# Patient Record
Sex: Female | Born: 1955 | Race: Black or African American | Hispanic: No | Smoking: Former smoker
Health system: Southern US, Community
[De-identification: ages and names within clinical notes are randomized; demographics above are authoritative.]

## PROBLEM LIST (undated history)

## (undated) DIAGNOSIS — T7840XA Allergy, unspecified, initial encounter: Secondary | ICD-10-CM

## (undated) DIAGNOSIS — E119 Type 2 diabetes mellitus without complications: Secondary | ICD-10-CM

## (undated) DIAGNOSIS — M1990 Unspecified osteoarthritis, unspecified site: Secondary | ICD-10-CM

## (undated) DIAGNOSIS — I10 Essential (primary) hypertension: Secondary | ICD-10-CM

## (undated) DIAGNOSIS — H269 Unspecified cataract: Secondary | ICD-10-CM

## (undated) DIAGNOSIS — K219 Gastro-esophageal reflux disease without esophagitis: Secondary | ICD-10-CM

## (undated) HISTORY — DX: Allergy, unspecified, initial encounter: T78.40XA

## (undated) HISTORY — DX: Gastro-esophageal reflux disease without esophagitis: K21.9

## (undated) HISTORY — PX: TUBAL LIGATION: SHX77

## (undated) HISTORY — DX: Essential (primary) hypertension: I10

## (undated) HISTORY — DX: Unspecified osteoarthritis, unspecified site: M19.90

## (undated) HISTORY — DX: Unspecified cataract: H26.9

## (undated) HISTORY — DX: Type 2 diabetes mellitus without complications: E11.9

## (undated) HISTORY — PX: EYE SURGERY: SHX253

---

## 1998-01-02 ENCOUNTER — Encounter: Admission: RE | Admit: 1998-01-02 | Discharge: 1998-01-02 | Payer: Self-pay | Admitting: Family Medicine

## 1998-06-03 ENCOUNTER — Encounter: Admission: RE | Admit: 1998-06-03 | Discharge: 1998-06-03 | Payer: Self-pay | Admitting: Sports Medicine

## 1998-06-30 ENCOUNTER — Encounter: Admission: RE | Admit: 1998-06-30 | Discharge: 1998-06-30 | Payer: Self-pay | Admitting: Family Medicine

## 1998-07-04 ENCOUNTER — Encounter: Admission: RE | Admit: 1998-07-04 | Discharge: 1998-07-04 | Payer: Self-pay | Admitting: Family Medicine

## 1998-07-14 ENCOUNTER — Encounter: Admission: RE | Admit: 1998-07-14 | Discharge: 1998-07-14 | Payer: Self-pay | Admitting: Pediatrics

## 1998-07-29 ENCOUNTER — Encounter: Admission: RE | Admit: 1998-07-29 | Discharge: 1998-07-29 | Payer: Self-pay | Admitting: Sports Medicine

## 1998-08-08 ENCOUNTER — Encounter: Admission: RE | Admit: 1998-08-08 | Discharge: 1998-08-08 | Payer: Self-pay | Admitting: Family Medicine

## 1998-08-26 ENCOUNTER — Encounter: Admission: RE | Admit: 1998-08-26 | Discharge: 1998-08-26 | Payer: Self-pay | Admitting: Sports Medicine

## 1998-10-06 ENCOUNTER — Encounter: Admission: RE | Admit: 1998-10-06 | Discharge: 1998-10-06 | Payer: Self-pay | Admitting: Family Medicine

## 1998-11-28 ENCOUNTER — Encounter: Admission: RE | Admit: 1998-11-28 | Discharge: 1998-11-28 | Payer: Self-pay | Admitting: Family Medicine

## 1998-12-10 ENCOUNTER — Encounter: Admission: RE | Admit: 1998-12-10 | Discharge: 1998-12-10 | Payer: Self-pay | Admitting: Family Medicine

## 1998-12-22 ENCOUNTER — Encounter: Admission: RE | Admit: 1998-12-22 | Discharge: 1998-12-22 | Payer: Self-pay | Admitting: Family Medicine

## 1999-01-22 ENCOUNTER — Encounter: Admission: RE | Admit: 1999-01-22 | Discharge: 1999-01-22 | Payer: Self-pay | Admitting: Family Medicine

## 1999-02-02 ENCOUNTER — Encounter: Admission: RE | Admit: 1999-02-02 | Discharge: 1999-02-02 | Payer: Self-pay | Admitting: Family Medicine

## 1999-09-17 ENCOUNTER — Encounter: Payer: Self-pay | Admitting: Sports Medicine

## 1999-09-17 ENCOUNTER — Ambulatory Visit (HOSPITAL_COMMUNITY): Admission: RE | Admit: 1999-09-17 | Discharge: 1999-09-17 | Payer: Self-pay | Admitting: Family Medicine

## 1999-09-17 ENCOUNTER — Encounter: Admission: RE | Admit: 1999-09-17 | Discharge: 1999-09-17 | Payer: Self-pay | Admitting: Sports Medicine

## 2000-02-22 ENCOUNTER — Encounter: Admission: RE | Admit: 2000-02-22 | Discharge: 2000-02-22 | Payer: Self-pay | Admitting: Family Medicine

## 2000-03-24 ENCOUNTER — Encounter: Admission: RE | Admit: 2000-03-24 | Discharge: 2000-03-24 | Payer: Self-pay | Admitting: Family Medicine

## 2000-03-31 ENCOUNTER — Encounter: Admission: RE | Admit: 2000-03-31 | Discharge: 2000-03-31 | Payer: Self-pay | Admitting: Family Medicine

## 2000-03-31 ENCOUNTER — Encounter: Payer: Self-pay | Admitting: Family Medicine

## 2000-05-10 ENCOUNTER — Encounter: Payer: Self-pay | Admitting: Emergency Medicine

## 2000-05-10 ENCOUNTER — Emergency Department (HOSPITAL_COMMUNITY): Admission: EM | Admit: 2000-05-10 | Discharge: 2000-05-10 | Payer: Self-pay | Admitting: Emergency Medicine

## 2000-05-31 ENCOUNTER — Encounter: Admission: RE | Admit: 2000-05-31 | Discharge: 2000-05-31 | Payer: Self-pay | Admitting: Sports Medicine

## 2000-07-19 ENCOUNTER — Encounter: Admission: RE | Admit: 2000-07-19 | Discharge: 2000-07-19 | Payer: Self-pay | Admitting: Family Medicine

## 2000-07-20 ENCOUNTER — Encounter: Admission: RE | Admit: 2000-07-20 | Discharge: 2000-07-20 | Payer: Self-pay | Admitting: Family Medicine

## 2000-08-17 ENCOUNTER — Encounter (INDEPENDENT_AMBULATORY_CARE_PROVIDER_SITE_OTHER): Payer: Self-pay | Admitting: Specialist

## 2000-08-17 ENCOUNTER — Ambulatory Visit (HOSPITAL_COMMUNITY): Admission: RE | Admit: 2000-08-17 | Discharge: 2000-08-17 | Payer: Self-pay | Admitting: Gastroenterology

## 2000-11-28 ENCOUNTER — Encounter: Admission: RE | Admit: 2000-11-28 | Discharge: 2000-11-28 | Payer: Self-pay | Admitting: Family Medicine

## 2000-12-27 ENCOUNTER — Encounter: Admission: RE | Admit: 2000-12-27 | Discharge: 2000-12-27 | Payer: Self-pay | Admitting: Family Medicine

## 2001-03-10 ENCOUNTER — Encounter: Admission: RE | Admit: 2001-03-10 | Discharge: 2001-03-10 | Payer: Self-pay | Admitting: Family Medicine

## 2001-05-24 ENCOUNTER — Encounter: Admission: RE | Admit: 2001-05-24 | Discharge: 2001-05-24 | Payer: Self-pay | Admitting: Family Medicine

## 2001-11-22 ENCOUNTER — Encounter: Admission: RE | Admit: 2001-11-22 | Discharge: 2001-11-22 | Payer: Self-pay | Admitting: Family Medicine

## 2001-12-07 ENCOUNTER — Encounter: Payer: Self-pay | Admitting: Sports Medicine

## 2001-12-07 ENCOUNTER — Encounter: Admission: RE | Admit: 2001-12-07 | Discharge: 2001-12-07 | Payer: Self-pay | Admitting: Sports Medicine

## 2002-01-16 ENCOUNTER — Encounter: Admission: RE | Admit: 2002-01-16 | Discharge: 2002-01-16 | Payer: Self-pay | Admitting: Family Medicine

## 2002-02-06 ENCOUNTER — Encounter: Admission: RE | Admit: 2002-02-06 | Discharge: 2002-02-06 | Payer: Self-pay | Admitting: Family Medicine

## 2002-02-06 ENCOUNTER — Ambulatory Visit (HOSPITAL_COMMUNITY): Admission: RE | Admit: 2002-02-06 | Discharge: 2002-02-06 | Payer: Self-pay | Admitting: Family Medicine

## 2002-02-15 ENCOUNTER — Encounter: Admission: RE | Admit: 2002-02-15 | Discharge: 2002-02-15 | Payer: Self-pay | Admitting: Family Medicine

## 2002-03-06 ENCOUNTER — Encounter: Admission: RE | Admit: 2002-03-06 | Discharge: 2002-03-06 | Payer: Self-pay | Admitting: Family Medicine

## 2002-08-02 ENCOUNTER — Encounter: Admission: RE | Admit: 2002-08-02 | Discharge: 2002-08-02 | Payer: Self-pay | Admitting: Family Medicine

## 2002-08-02 ENCOUNTER — Ambulatory Visit (HOSPITAL_COMMUNITY): Admission: RE | Admit: 2002-08-02 | Discharge: 2002-08-02 | Payer: Self-pay | Admitting: Family Medicine

## 2002-08-03 ENCOUNTER — Encounter: Payer: Self-pay | Admitting: Sports Medicine

## 2002-08-03 ENCOUNTER — Encounter: Admission: RE | Admit: 2002-08-03 | Discharge: 2002-08-03 | Payer: Self-pay | Admitting: Sports Medicine

## 2002-08-21 ENCOUNTER — Encounter: Admission: RE | Admit: 2002-08-21 | Discharge: 2002-08-21 | Payer: Self-pay | Admitting: Family Medicine

## 2002-09-13 ENCOUNTER — Ambulatory Visit (HOSPITAL_COMMUNITY): Admission: RE | Admit: 2002-09-13 | Discharge: 2002-09-13 | Payer: Self-pay | Admitting: Family Medicine

## 2002-09-13 ENCOUNTER — Encounter: Payer: Self-pay | Admitting: Cardiology

## 2003-05-28 ENCOUNTER — Encounter: Admission: RE | Admit: 2003-05-28 | Discharge: 2003-05-28 | Payer: Self-pay | Admitting: Family Medicine

## 2003-06-24 ENCOUNTER — Encounter: Admission: RE | Admit: 2003-06-24 | Discharge: 2003-06-24 | Payer: Self-pay | Admitting: Family Medicine

## 2003-07-05 ENCOUNTER — Encounter (INDEPENDENT_AMBULATORY_CARE_PROVIDER_SITE_OTHER): Payer: Self-pay | Admitting: *Deleted

## 2003-07-25 ENCOUNTER — Encounter: Admission: RE | Admit: 2003-07-25 | Discharge: 2003-07-25 | Payer: Self-pay | Admitting: Family Medicine

## 2003-09-04 ENCOUNTER — Encounter: Admission: RE | Admit: 2003-09-04 | Discharge: 2003-09-04 | Payer: Self-pay | Admitting: Sports Medicine

## 2003-11-12 ENCOUNTER — Encounter: Admission: RE | Admit: 2003-11-12 | Discharge: 2003-11-12 | Payer: Self-pay | Admitting: Family Medicine

## 2003-11-29 ENCOUNTER — Encounter: Admission: RE | Admit: 2003-11-29 | Discharge: 2003-11-29 | Payer: Self-pay | Admitting: Family Medicine

## 2004-05-27 ENCOUNTER — Inpatient Hospital Stay (HOSPITAL_COMMUNITY): Admission: EM | Admit: 2004-05-27 | Discharge: 2004-06-02 | Payer: Self-pay | Admitting: Emergency Medicine

## 2004-05-27 ENCOUNTER — Ambulatory Visit: Payer: Self-pay | Admitting: Sports Medicine

## 2004-06-11 ENCOUNTER — Ambulatory Visit: Payer: Self-pay | Admitting: Family Medicine

## 2004-10-21 ENCOUNTER — Ambulatory Visit: Payer: Self-pay | Admitting: Family Medicine

## 2005-04-30 ENCOUNTER — Ambulatory Visit: Payer: Self-pay | Admitting: Family Medicine

## 2005-07-25 ENCOUNTER — Emergency Department (HOSPITAL_COMMUNITY): Admission: EM | Admit: 2005-07-25 | Discharge: 2005-07-26 | Payer: Self-pay | Admitting: Emergency Medicine

## 2005-12-24 ENCOUNTER — Ambulatory Visit: Payer: Self-pay | Admitting: Family Medicine

## 2006-01-27 ENCOUNTER — Ambulatory Visit: Payer: Self-pay | Admitting: Family Medicine

## 2006-08-09 ENCOUNTER — Ambulatory Visit (HOSPITAL_COMMUNITY): Admission: RE | Admit: 2006-08-09 | Discharge: 2006-08-09 | Payer: Self-pay | Admitting: Family Medicine

## 2006-08-09 ENCOUNTER — Ambulatory Visit: Payer: Self-pay | Admitting: Family Medicine

## 2006-09-14 ENCOUNTER — Ambulatory Visit: Payer: Self-pay | Admitting: Sports Medicine

## 2006-12-01 DIAGNOSIS — E782 Mixed hyperlipidemia: Secondary | ICD-10-CM

## 2006-12-01 DIAGNOSIS — F329 Major depressive disorder, single episode, unspecified: Secondary | ICD-10-CM

## 2006-12-01 DIAGNOSIS — E1169 Type 2 diabetes mellitus with other specified complication: Secondary | ICD-10-CM | POA: Insufficient documentation

## 2006-12-01 DIAGNOSIS — F32A Depression, unspecified: Secondary | ICD-10-CM | POA: Insufficient documentation

## 2006-12-01 DIAGNOSIS — K219 Gastro-esophageal reflux disease without esophagitis: Secondary | ICD-10-CM | POA: Insufficient documentation

## 2006-12-01 DIAGNOSIS — I10 Essential (primary) hypertension: Secondary | ICD-10-CM | POA: Insufficient documentation

## 2006-12-02 ENCOUNTER — Encounter (INDEPENDENT_AMBULATORY_CARE_PROVIDER_SITE_OTHER): Payer: Self-pay | Admitting: *Deleted

## 2007-03-31 ENCOUNTER — Encounter: Payer: Self-pay | Admitting: Family Medicine

## 2007-03-31 ENCOUNTER — Ambulatory Visit: Payer: Self-pay | Admitting: Family Medicine

## 2007-03-31 DIAGNOSIS — D179 Benign lipomatous neoplasm, unspecified: Secondary | ICD-10-CM | POA: Insufficient documentation

## 2007-06-06 ENCOUNTER — Encounter: Payer: Self-pay | Admitting: Family Medicine

## 2007-06-06 ENCOUNTER — Ambulatory Visit: Payer: Self-pay | Admitting: Family Medicine

## 2007-06-06 LAB — CONVERTED CEMR LAB
ALT: 17 units/L (ref 0–35)
AST: 16 units/L (ref 0–37)
Alkaline Phosphatase: 63 units/L (ref 39–117)
BUN: 11 mg/dL (ref 6–23)
Chloride: 103 meq/L (ref 96–112)
Glucose, Bld: 153 mg/dL — ABNORMAL HIGH (ref 70–99)
LDL Cholesterol: 148 mg/dL — ABNORMAL HIGH (ref 0–99)
Pap Smear: NORMAL
Potassium: 3.9 meq/L (ref 3.5–5.3)
Triglycerides: 152 mg/dL — ABNORMAL HIGH (ref <150)

## 2007-12-25 ENCOUNTER — Telehealth: Payer: Self-pay | Admitting: *Deleted

## 2008-02-21 ENCOUNTER — Encounter: Payer: Self-pay | Admitting: Family Medicine

## 2008-02-21 ENCOUNTER — Ambulatory Visit: Payer: Self-pay | Admitting: Family Medicine

## 2008-02-21 LAB — CONVERTED CEMR LAB
Alkaline Phosphatase: 70 units/L (ref 39–117)
CO2: 23 meq/L (ref 19–32)
Chloride: 102 meq/L (ref 96–112)
Glucose, Bld: 137 mg/dL — ABNORMAL HIGH (ref 70–99)
Potassium: 3.9 meq/L (ref 3.5–5.3)
Sodium: 141 meq/L (ref 135–145)
Total Bilirubin: 0.4 mg/dL (ref 0.3–1.2)

## 2008-03-08 ENCOUNTER — Ambulatory Visit: Payer: Self-pay | Admitting: Family Medicine

## 2008-03-08 DIAGNOSIS — E119 Type 2 diabetes mellitus without complications: Secondary | ICD-10-CM | POA: Insufficient documentation

## 2008-03-08 DIAGNOSIS — E0865 Diabetes mellitus due to underlying condition with hyperglycemia: Secondary | ICD-10-CM | POA: Insufficient documentation

## 2008-03-08 DIAGNOSIS — E0840 Diabetes mellitus due to underlying condition with diabetic neuropathy, unspecified: Secondary | ICD-10-CM

## 2008-04-18 ENCOUNTER — Ambulatory Visit: Payer: Self-pay | Admitting: Family Medicine

## 2008-06-05 ENCOUNTER — Encounter: Payer: Self-pay | Admitting: Family Medicine

## 2008-06-05 ENCOUNTER — Ambulatory Visit: Payer: Self-pay | Admitting: Family Medicine

## 2008-06-05 LAB — CONVERTED CEMR LAB
AST: 16 units/L (ref 0–37)
Albumin: 4.2 g/dL (ref 3.5–5.2)
Alkaline Phosphatase: 60 units/L (ref 39–117)
CO2: 25 meq/L (ref 19–32)
Calcium: 9.4 mg/dL (ref 8.4–10.5)
Glucose, Bld: 124 mg/dL — ABNORMAL HIGH (ref 70–99)
Total Bilirubin: 0.4 mg/dL (ref 0.3–1.2)

## 2008-08-23 ENCOUNTER — Ambulatory Visit: Payer: Self-pay | Admitting: Family Medicine

## 2008-09-09 ENCOUNTER — Ambulatory Visit: Payer: Self-pay | Admitting: Family Medicine

## 2008-09-14 ENCOUNTER — Ambulatory Visit: Payer: Self-pay | Admitting: Internal Medicine

## 2008-09-14 ENCOUNTER — Ambulatory Visit: Payer: Self-pay | Admitting: Family Medicine

## 2008-09-14 ENCOUNTER — Inpatient Hospital Stay (HOSPITAL_COMMUNITY): Admission: EM | Admit: 2008-09-14 | Discharge: 2008-09-16 | Payer: Self-pay | Admitting: Emergency Medicine

## 2008-09-16 ENCOUNTER — Encounter: Payer: Self-pay | Admitting: Family Medicine

## 2008-10-02 ENCOUNTER — Telehealth: Payer: Self-pay | Admitting: Family Medicine

## 2008-10-02 ENCOUNTER — Ambulatory Visit: Payer: Self-pay | Admitting: Family Medicine

## 2008-10-02 ENCOUNTER — Encounter: Payer: Self-pay | Admitting: Family Medicine

## 2008-10-14 ENCOUNTER — Ambulatory Visit: Payer: Self-pay | Admitting: Family Medicine

## 2008-11-19 ENCOUNTER — Encounter: Payer: Self-pay | Admitting: Family Medicine

## 2008-11-22 ENCOUNTER — Telehealth: Payer: Self-pay | Admitting: *Deleted

## 2008-12-13 ENCOUNTER — Ambulatory Visit: Payer: Self-pay | Admitting: Family Medicine

## 2008-12-13 ENCOUNTER — Telehealth: Payer: Self-pay | Admitting: Family Medicine

## 2009-01-01 ENCOUNTER — Ambulatory Visit: Payer: Self-pay | Admitting: Family Medicine

## 2009-01-01 LAB — CONVERTED CEMR LAB: Hgb A1c MFr Bld: 5.8 %

## 2009-02-14 ENCOUNTER — Encounter: Payer: Self-pay | Admitting: Family Medicine

## 2009-04-01 ENCOUNTER — Ambulatory Visit: Payer: Self-pay | Admitting: Family Medicine

## 2009-04-01 LAB — CONVERTED CEMR LAB: Hgb A1c MFr Bld: 5.7 %

## 2009-08-18 ENCOUNTER — Encounter: Payer: Self-pay | Admitting: Family Medicine

## 2009-08-18 ENCOUNTER — Ambulatory Visit: Payer: Self-pay | Admitting: Family Medicine

## 2009-08-18 ENCOUNTER — Telehealth: Payer: Self-pay | Admitting: Family Medicine

## 2009-08-19 LAB — CONVERTED CEMR LAB
Basophils Absolute: 0 10*3/uL (ref 0.0–0.1)
Eosinophils Absolute: 0.2 10*3/uL (ref 0.0–0.7)
Eosinophils Relative: 2 % (ref 0–5)
Lymphocytes Relative: 31 % (ref 12–46)
WBC: 10.4 10*3/uL (ref 4.0–10.5)

## 2009-08-21 ENCOUNTER — Ambulatory Visit: Payer: Self-pay | Admitting: Family Medicine

## 2009-08-21 LAB — CONVERTED CEMR LAB: H Pylori IgG: NEGATIVE

## 2009-10-23 ENCOUNTER — Encounter: Payer: Self-pay | Admitting: Family Medicine

## 2009-10-23 ENCOUNTER — Ambulatory Visit: Payer: Self-pay | Admitting: Family Medicine

## 2009-10-24 ENCOUNTER — Encounter: Payer: Self-pay | Admitting: Family Medicine

## 2009-10-29 ENCOUNTER — Telehealth: Payer: Self-pay | Admitting: Family Medicine

## 2009-10-29 ENCOUNTER — Encounter: Payer: Self-pay | Admitting: Family Medicine

## 2009-10-29 LAB — CONVERTED CEMR LAB
CO2: 27 meq/L (ref 19–32)
Calcium: 9.3 mg/dL (ref 8.4–10.5)
Creatinine, Ser: 0.71 mg/dL (ref 0.40–1.20)
Glucose, Bld: 116 mg/dL — ABNORMAL HIGH (ref 70–99)
HDL: 77 mg/dL (ref >39)
VLDL: 29 mg/dL (ref 0–40)

## 2009-11-06 ENCOUNTER — Telehealth (INDEPENDENT_AMBULATORY_CARE_PROVIDER_SITE_OTHER): Payer: Self-pay | Admitting: *Deleted

## 2009-11-07 ENCOUNTER — Ambulatory Visit: Payer: Self-pay | Admitting: Family Medicine

## 2010-02-05 ENCOUNTER — Ambulatory Visit: Payer: Self-pay | Admitting: Family Medicine

## 2010-02-05 ENCOUNTER — Encounter: Payer: Self-pay | Admitting: Family Medicine

## 2010-02-05 LAB — CONVERTED CEMR LAB: Direct LDL: 157 mg/dL — ABNORMAL HIGH

## 2010-07-10 ENCOUNTER — Encounter: Payer: Self-pay | Admitting: Family Medicine

## 2010-08-06 ENCOUNTER — Encounter: Payer: Self-pay | Admitting: Family Medicine

## 2010-09-03 ENCOUNTER — Ambulatory Visit: Payer: Self-pay | Admitting: Family Medicine

## 2010-09-03 LAB — CONVERTED CEMR LAB: Hgb A1c MFr Bld: 6.1 %

## 2010-11-05 NOTE — Letter (Signed)
Summary: FMLA Papers  FMLA Papers   Imported By: Clydell Hakim 10/27/2009 11:13:34  _____________________________________________________________________  External Attachment:    Type:   Image     Comment:   External Document

## 2010-11-05 NOTE — Assessment & Plan Note (Signed)
Summary: f/up,tcb   Vital Signs:  Patient profile:   55 year old female Height:      64 inches Weight:      251 pounds BMI:     43.24 Temp:     98.0 degrees F oral Pulse rate:   76 / minute BP sitting:   155 / 83  (left arm) Cuff size:   large  Vitals Entered By: Tessie Fass, CMA  Serial Vital Signs/Assessments:  Time      Position  BP       Pulse  Resp  Temp     By                     130/78                         [name]  MD  CC: F/U chronic diseases Is Patient Diabetic? Yes Pain Assessment Patient in pain? no        Primary Care Provider:  [name]  MD  CC:  F/U chronic diseases.  History of Present Illness: Mr. Abbe Amsterdam comes in today for follow-up of DM, HTN, HLD 1) DM - Sugars running higher than normal for a few weeks.  Checks them fasting only..  Running 102 - 140.  Did not eat well over the holidays.  On Metforming 1000 two times a day only.  No complaints.  Last A1C 6 months ago was 5.7.  2) HTN - running high for a week.  Running about 140s/70s.  Still taking HCTZ and enalapril.  Tolerating the medicines well. On recheck is improved. Worried about her kidneys and her potassium.  Would like them checked today.  3) HLD - Was on pravastatin 40 but made her thighs hurt so stopped it about 2 months ago. DUe to be checked.  Fasting this AM.   4) GI bleed - no further bleeding.  Originally thought was gastritis but then believed to be diverticular.  Has been off aspirin.  Informed that since it was not her stomach, she can resume aspirin.   Habits & Providers  Alcohol-Tobacco-Diet     Tobacco Status: quit  Allergies: 1)  Lovastatin (Lovastatin) 2)  Amoxicillin (Amoxicillin)  Physical Exam  General:  VS reviewed.  Well appearing, NAD. Obese Lungs:  Normal respiratory effort, chest expands symmetrically. Lungs are clear to auscultation, no crackles or wheezes. Heart:  Normal rate and regular rhythm. S1 and S2 normal without gallop, murmur, click,  rub or other extra sounds. Pulses:  2+ radial and dp pulses Extremities:  no edema   Impression & Recommendations:  Problem # 1:  DIABETES-TYPE 2 (ICD-250.00) Assessment Unchanged A1C still pending.  If > 7, will add sulfonylurea.  If <7, stay on current regimen but recheck in 3 months.  Her updated medication list for this problem includes:    Metformin Hcl 500 Mg Tabs (Metformin hcl) .Marland Kitchen... 1 tab by mouth daily for 1 week and then 1 tab by mouth two times a day.    Enalapril Maleate 20 Mg Tabs (Enalapril maleate) .Marland Kitchen... 2 tabs by mouth daily  Orders: A1C-[facility] (16109) Basic Met-[facility] ([address]-09811) [facility]- Est  Level 4 (91478)  Problem # 2:  HYPERTENSION, BENIGN SYSTEMIC (ICD-401.1) Assessment: Unchanged Continue current regimen.  SHe checks her BP periodically on her own.  Instructed that if it becomes consistently elevated to come back in sooner for change in BP regimen.  Pt agreeable.  Check Cr  and K today.  Her updated medication list for this problem includes:    Hydrochlorothiazide 25 Mg Tabs (Hydrochlorothiazide) .Marland Kitchen... Take 1 tablet by mouth once a day    Enalapril Maleate 20 Mg Tabs (Enalapril maleate) .Marland Kitchen... 2 tabs by mouth daily  Orders: Basic Met-[facility] ([address]-60454) [facility]- Est  Level 4 (09811)  Problem # 3:  HYPERCHOLESTEROLEMIA (ICD-272.0) Assessment: Unchanged Not being treated currently.  Will recheck FLP.  Pravastatin gave muscle aches.  Her updated medication list for this problem includes:    Pravastatin Sodium 40 Mg Tabs (Pravastatin sodium) .Marland Kitchen... Take 1 tablet by mouth once a day  Orders: Lipid-[facility] ([id number]) [facility]- Est  Level 4 (13086)  Problem # 4:  UNSPECIFIED HEMORRHAGE OF GASTROINTESTINAL TRACT (ICD-578.9) Believed to be diverticular.  refer to that note.  No further bleeding.  may resume aspirin.   Complete Medication List: 1)  Hydrochlorothiazide 25 Mg Tabs (Hydrochlorothiazide) .... Take 1 tablet by mouth once a day 2)  Pravastatin Sodium 40 Mg Tabs  (Pravastatin sodium) .... Take 1 tablet by mouth once a day 3)  Trazodone Hcl 50 Mg Tabs (Trazodone hcl) .Marland Kitchen.. 1-2 tabs by mouth at bedtime as needed for sleep 4)  Flonase 50 Mcg/act Susp (Fluticasone propionate) .... 2 sprays each nostril daily during seasonal allergies 5)  Metformin Hcl 500 Mg Tabs (Metformin hcl) .Marland Kitchen.. 1 tab by mouth daily for 1 week and then 1 tab by mouth two times a day. 6)  Enalapril Maleate 20 Mg Tabs (Enalapril maleate) .... 2 tabs by mouth daily 7)  Omeprazole 40 Mg Cpdr (Omeprazole) .... One tablet by mouth daily  Prevention & Chronic Care Immunizations   Influenza vaccine: Not documented    Tetanus booster: 12/13/2008: Tdap   Tetanus booster due: 10/04/2006    Pneumococcal vaccine: Not documented  Colorectal Screening   Hemoccult: Done.  (03/04/2000)   Hemoccult due: 03/04/2001    Colonoscopy: Not documented  Other Screening   Pap smear: normal  (06/06/2007)   Pap smear due: 06/2008    Mammogram: Done.  (09/04/2003)   Mammogram due: 09/03/2004   Smoking status: quit  (10/23/2009)  Diabetes Mellitus   HgbA1C: 5.7  (04/01/2009)   Hemoglobin A1C due: 11/23/2008    Eye exam: Not documented    Foot exam: yes  (01/01/2009)   High risk foot: Not documented   Foot care education: Not documented   Foot exam due: 03/08/2009    Urine microalbumin/creatinine ratio: Not documented    Diabetes flowsheet reviewed?: Yes   Progress toward A1C goal: At goal  Lipids   Total Cholesterol: 251  (06/06/2007)   LDL: 148  (06/06/2007)   LDL Direct: 184  (02/21/2008)   HDL: 73  (06/06/2007)   Triglycerides: 152  (06/06/2007)    SGOT (AST): 16  (06/05/2008)   SGPT (ALT): 15  (06/05/2008)   Alkaline phosphatase: 60  (06/05/2008)   Total bilirubin: 0.4  (06/05/2008)    Lipid flowsheet reviewed?: Yes   Progress toward LDL goal: Unchanged  Hypertension   Last Blood Pressure: 155 / 83  (10/23/2009)   Serum creatinine: 0.71  (06/05/2008)   Serum potassium  4.0  (06/05/2008)    Hypertension flowsheet reviewed?: Yes   Progress toward BP goal: At goal  Self-Management Support :   Personal Goals (by the next clinic visit) :     Personal A1C goal: 7  (10/23/2009)     Personal blood pressure goal: 130/80  (10/23/2009)     Personal LDL goal: 100  (  10/23/2009)    Diabetes self-management support: Not documented    Hypertension self-management support: Not documented    Lipid self-management support: Not documented    Appended Document: A1c results  Laboratory Results   Blood Tests   Date/Time Received: October 23, 2009 10:09 AM  Date/Time Reported: October 23, 2009 10:50 AM   HGBA1C: 6.0%   (Normal Range: Non-Diabetic - 3-6%   Control Diabetic - 6-8%)  Comments: ...........test performed by...........Marland KitchenTerese Door, CMA

## 2010-11-05 NOTE — Assessment & Plan Note (Signed)
Summary: dm/eo   Vital Signs:  Patient profile:   55 year old female Height:      64 inches Weight:      253 pounds BMI:     43.58 Temp:     97.9 degrees F oral Pulse rate:   84 / minute BP sitting:   125 / 74  (right arm) Cuff size:   large  Vitals Entered By: Tessie Fass CMA (Feb 05, 2010 11:25 AM) CC: F/U DM Is Patient Diabetic? Yes Pain Assessment Patient in pain? no        Primary Care Provider:  [name]  MD  CC:  F/U DM.  History of Present Illness: [name] comes in for follow-up of dm, htn, and hld.  She has no complaints today. 1) DM - Has consistently been under good control.  On metformin only.  Has not been checking her sugars because she says she can feel when it is high or low and has not had any such feelings.  Did check it one time 6 weeks ago and it was 130, which is on the higher side for her. Tolerating all meds well. 2) HTN - Was borderlinea t last visit but did not want more meds.  Is exercising more and eating better and BP at goal today. 3) HLD - only taking pravastatin about every other day or ever 3 days, prob 3-4 times a week she says, due to muscle pain.  Tolerates it well at this frequency though.  She is aware her LDL is nowhere near goal and the risk that poses, but is only willing at this point to take pravastatin at this dose.  Due for recheck today.   Habits & Providers  Alcohol-Tobacco-Diet     Tobacco Status: quit  Current Medications (verified): 1)  Hydrochlorothiazide 25 Mg Tabs (Hydrochlorothiazide) .... Take 1 Tablet By Mouth Once A Day 2)  Pravastatin Sodium 20 Mg Tabs (Pravastatin Sodium) .Marland Kitchen.. 1 Tab By Mouth Qhs For High Cholesterol 3)  Trazodone Hcl 50 Mg Tabs (Trazodone Hcl) .Marland Kitchen.. 1-2 Tabs By Mouth At Bedtime As Needed For Sleep 4)  Flonase 50 Mcg/act  Susp (Fluticasone Propionate) .... 2 Sprays Each Nostril Daily During Seasonal Allergies 5)  Metformin Hcl 500 Mg  Tabs (Metformin Hcl) .Marland Kitchen.. 1 Tab By Mouth Daily For 1 Week and  Then 1 Tab By Mouth Two Times A Day. 6)  Enalapril Maleate 20 Mg  Tabs (Enalapril Maleate) .... 2 Tabs By Mouth Daily 7)  Omeprazole 40 Mg Cpdr (Omeprazole) .... One Tablet By Mouth Daily 8)  Naprosyn 250 Mg Tabs (Naproxen) .... 2 Tabs Two Times A Day 9)  Hydrocodone-Acetaminophen 5-500 Mg Tabs (Hydrocodone-Acetaminophen) .... One Tab Q 4 Hours As Needed Pain  Allergies: 1)  Lovastatin (Lovastatin) 2)  Amoxicillin (Amoxicillin)  Physical Exam  General:  obese, alert, NAD vitals reviewed Eyes:  pupils equal, pupils round, corneas and lenses clear, and no injection.   Lungs:  Normal respiratory effort, chest expands symmetrically. Lungs are clear to auscultation, no crackles or wheezes. Heart:  Normal rate and regular rhythm. S1 and S2 normal without gallop, murmur, click, rub or other extra sounds. Pulses:  2+ radial and distal Extremities:  no edema  Diabetes Management Exam:    Foot Exam (with socks and/or shoes not present):       Sensory-Pinprick/Light touch:          Left medial foot (L-4): normal          Left  dorsal foot (L-5): normal          Left lateral foot (S-1): normal          Right medial foot (L-4): normal          Right dorsal foot (L-5): normal          Right lateral foot (S-1): normal       Sensory-Monofilament:          Left foot: normal          Right foot: normal       Inspection:          Left foot: normal          Right foot: normal       Nails:          Left foot: normal          Right foot: normal   Impression & Recommendations:  Problem # 1:  DIABETES-TYPE 2 (ICD-250.00) Assessment Unchanged At goal, but her A2C is starting to creep up.  If up again at next check, would increase metformin to 1000 two times a day.  Her updated medication list for this problem includes:    Metformin Hcl 500 Mg Tabs (Metformin hcl) .Marland Kitchen... 1 tab by mouth daily for 1 week and then 1 tab by mouth two times a day.    Enalapril Maleate 20 Mg Tabs (Enalapril maleate)  .Marland Kitchen... 2 tabs by mouth daily  Orders: A1C-[facility] (08657) [facility]- Est  Level 4 (84696)  Problem # 2:  HYPERTENSION, BENIGN SYSTEMIC (ICD-401.1) Assessment: Unchanged  At goal, no changes.  Her updated medication list for this problem includes:    Hydrochlorothiazide 25 Mg Tabs (Hydrochlorothiazide) .Marland Kitchen... Take 1 tablet by mouth once a day    Enalapril Maleate 20 Mg Tabs (Enalapril maleate) .Marland Kitchen... 2 tabs by mouth daily  Orders: [facility]- Est  Level 4 (99214)  Problem # 3:  HYPERCHOLESTEROLEMIA (ICD-272.0) Assessment: Unchanged recheck cholesterol today Her updated medication list for this problem includes:    Pravastatin Sodium 20 Mg Tabs (Pravastatin sodium) .Marland Kitchen... 1 tab by mouth qhs for high cholesterol  Orders: Direct LDL-[facility] (29528-41324) [facility]- Est  Level 4 (40102)  Complete Medication List: 1)  Hydrochlorothiazide 25 Mg Tabs (Hydrochlorothiazide) .... Take 1 tablet by mouth once a day 2)  Pravastatin Sodium 20 Mg Tabs (Pravastatin sodium) .Marland Kitchen.. 1 tab by mouth qhs for high cholesterol 3)  Trazodone Hcl 50 Mg Tabs (Trazodone hcl) .Marland Kitchen.. 1-2 tabs by mouth at bedtime as needed for sleep 4)  Flonase 50 Mcg/act Susp (Fluticasone propionate) .... 2 sprays each nostril daily during seasonal allergies 5)  Metformin Hcl 500 Mg Tabs (Metformin hcl) .Marland Kitchen.. 1 tab by mouth daily for 1 week and then 1 tab by mouth two times a day. 6)  Enalapril Maleate 20 Mg Tabs (Enalapril maleate) .... 2 tabs by mouth daily 7)  Omeprazole 40 Mg Cpdr (Omeprazole) .... One tablet by mouth daily 8)  Naprosyn 250 Mg Tabs (Naproxen) .... 2 tabs two times a day 9)  Hydrocodone-acetaminophen 5-500 Mg Tabs (Hydrocodone-acetaminophen) .... One tab q 4 hours as needed pain  Laboratory Results   Blood Tests   Date/Time Received: Feb 05, 2010 11:57 AM  Date/Time Reported: Feb 05, 2010 12:16 PM   HGBA1C: 6.1%   (Normal Range: Non-Diabetic - 3-6%   Control Diabetic - 6-8%)  Comments: ...........test performed by...........Marland KitchenTerese Door, CMA      Prevention & Chronic Care Immunizations   Influenza  vaccine: Not documented    Tetanus booster: 12/13/2008: Tdap   Tetanus booster due: 10/04/2006    Pneumococcal vaccine: Not documented  Colorectal Screening   Hemoccult: Done.  (03/04/2000)   Hemoccult due: 03/04/2001    Colonoscopy: Not documented  Other Screening   Pap smear: normal  (06/06/2007)   Pap smear due: 06/2008    Mammogram: Done.  (09/04/2003)   Mammogram due: 09/03/2004   Smoking status: quit  (02/05/2010)  Diabetes Mellitus   HgbA1C: 6.1  (02/05/2010)   Hemoglobin A1C due: 11/23/2008    Eye exam: Not documented    Foot exam: yes  (02/05/2010)   High risk foot: Not documented   Foot care education: Not documented   Foot exam due: 03/08/2009    Urine microalbumin/creatinine ratio: Not documented    Diabetes flowsheet reviewed?: Yes   Progress toward A1C goal: At goal  Lipids   Total Cholesterol: 258  (10/23/2009)   LDL: 152  (10/23/2009)   LDL Direct: 184  (02/21/2008)   HDL: 77  (10/23/2009)   Triglycerides: 147  (10/23/2009)    SGOT (AST): 16  (06/05/2008)   SGPT (ALT): 15  (06/05/2008)   Alkaline phosphatase: 60  (06/05/2008)   Total bilirubin: 0.4  (06/05/2008)    Lipid flowsheet reviewed?: Yes   Progress toward LDL goal: Unchanged  Hypertension   Last Blood Pressure: 125 / 74  (02/05/2010)   Serum creatinine: 0.71  (10/23/2009)   Serum potassium 3.8  (10/23/2009)    Hypertension flowsheet reviewed?: Yes   Progress toward BP goal: At goal  Self-Management Support :   Personal Goals (by the next clinic visit) :     Personal A1C goal: 7  (10/23/2009)     Personal blood pressure goal: 130/80  (10/23/2009)     Personal LDL goal: 100  (10/23/2009)    Diabetes self-management support: Not documented    Hypertension self-management support: Not documented    Lipid self-management support: Not documented

## 2010-11-05 NOTE — Miscellaneous (Signed)
Summary: FMLA  pt daughter dropped off form to be completed for her job for [employer], placed on triage desk for any clinical info to be completed. Knox Royalty  October 23, 2009 3:42 PM   to pcp.Golden Circle RN  October 24, 2009 8:43 AM completed. [name]  MD  October 24, 2009 8:56 AM

## 2010-11-05 NOTE — Assessment & Plan Note (Signed)
Summary: cpe,df   Vital Signs:  Patient profile:   55 year old female Height:      64 inches Weight:      255 pounds BMI:     43.93 Temp:     98.1 degrees F oral Pulse rate:   88 / minute BP sitting:   139 / 80  (left arm) Cuff size:   large  Vitals Entered By: Tessie Fass CMA (September 03, 2010 2:58 PM) CC: CPE, Hypertension Management Is Patient Diabetic? Yes Pain Assessment Patient in pain? no        Primary Provider:  Demetria Pore MD  CC:  CPE and Hypertension Management.  History of Present Illness: Pt comes in for CPE, but would like to defer PAP and most blood work until she has completed [employer] information and has orange card.  Overall she has been doing well.  Brother (53) had MI and 2 strokes in June and is now at [facility] where they are deciding if they will open a duct in his pancreas (he is a chronic alcoholic).  She has been busy helping take care of him and her Dad and is in school finishing her bachelors in Business Management.   Per pt, her BP has been elevated for the past few weeks.  She checks it on a home cuff.  She has been taking her medicine regularly and as prescribed but she has not been eating right.  States she has been having lots of salt, carbs, sweets, and white starches, even though she knows she shouldn't.  She also has not really been exercising since June because she has been busy.  She checks her CBG's  ~ 1x/week. States the last few have been elevated to 120s, 130s, and are usually 110-115. She also thinks this is because of her poor eating habits recently.   Only complaint is that she has been having headaches, every day for  ~the past 3-4 weeks. Notices that they are usually when she goes for a long period of time w/o eating.  They improve once she eats something. Nothing else seems to trigger them. No other associate symptoms.  No chest pain, orthopnea. Does complain of DOE. Also has started experiencing black floaters in her  eyes over the past few months. Last optho appt was June 2010, at which time she was not experiencing them.  She cannot afford to go to optho right now since her unemployment has run out.   Hypertension History:      She complains of headache, dyspnea with exertion, visual symptoms, syncope, and side effects from treatment, but denies chest pain, palpitations, orthopnea, peripheral edema, and neurologic problems.  She notes no problems with any antihypertensive medication side effects.  Metformin: has to eat a certain amt or it upsets stomach.  Further comments include: floaters in vision;dizziness "out of no where," lasts a few seconds, feels "off balance," happens when sitting, walking, standing, etc. .        Positive major cardiovascular risk factors include female age 28 years old or older, diabetes, hyperlipidemia, and hypertension.  Negative major cardiovascular risk factors include non-tobacco-user status.      Current Problems (verified): 1)  Diabetes-type 2  (ICD-250.00) 2)  Lipoma  (ICD-214.9) 3)  Obesity, Nos  (ICD-278.00) 4)  Hypertension, Benign Systemic  (ICD-401.1) 5)  Hypercholesterolemia  (ICD-272.0) 6)  Gastroesophageal Reflux, No Esophagitis  (ICD-530.81) 7)  Depressive Disorder, Nos  (ICD-311)  Current Medications (verified): 1)  Hydrochlorothiazide 25  Mg Tabs (Hydrochlorothiazide) .... Take 1 Tablet By Mouth Once A Day 2)  Pravastatin Sodium 20 Mg Tabs (Pravastatin Sodium) .Marland Kitchen.. 1 Tab By Mouth Qhs For High Cholesterol 3)  Trazodone Hcl 50 Mg Tabs (Trazodone Hcl) .Marland Kitchen.. 1-2 Tabs By Mouth At Bedtime As Needed For Sleep 4)  Flonase 50 Mcg/act  Susp (Fluticasone Propionate) .... 2 Sprays Each Nostril Daily During Seasonal Allergies 5)  Enalapril Maleate 20 Mg  Tabs (Enalapril Maleate) .... 2 Tabs By Mouth Daily 6)  Omeprazole 40 Mg Cpdr (Omeprazole) .... One Tablet By Mouth Daily 7)  Naprosyn 250 Mg Tabs (Naproxen) .... 2 Tabs Two Times A Day 8)  Hydrocodone-Acetaminophen 5-500  Mg Tabs (Hydrocodone-Acetaminophen) .... One Tab Q 4 Hours As Needed Pain 9)  Metformin Hcl 500 Mg Tabs (Metformin Hcl) .... Take One Tablet Twice Daily  Allergies (verified): 1)  Lovastatin (Lovastatin) 2)  Amoxicillin (Amoxicillin)  Physical Exam  General:  alert, well-hydrated, and overweight-appearing.   Eyes:  vision grossly intact, pupils equal, pupils round, pupils reactive to light, pupils react to accomodation, corneas and lenses clear, no optic disk abnormalities, and no nystagmus.   Nose:  no external deformity.   Mouth:  pharynx pink and moist and fair dentition.   Neck:  supple, full ROM, no masses, and no thyroid nodules or tenderness.   Lungs:  normal respiratory effort, normal breath sounds, no crackles, and no wheezes.   Heart:  normal rate, regular rhythm, and no murmur.   Abdomen:  soft, non-tender, and normal bowel sounds.   Pulses:  2+ pedal pulses Extremities:  1+ BLE edema Neurologic:  alert & oriented X3 and cranial nerves II-XII intact.    Diabetes Management Exam:    Foot Exam (with socks and/or shoes not present):       Sensory-Pinprick/Light touch:          Left medial foot (L-4): normal          Left dorsal foot (L-5): normal          Left lateral foot (S-1): normal          Right medial foot (L-4): normal          Right dorsal foot (L-5): normal          Right lateral foot (S-1): normal       Sensory-Monofilament:          Left foot: normal          Right foot: normal       Inspection:          Left foot: normal          Right foot: normal       Nails:          Left foot: normal          Right foot: normal    Eye Exam:       Eye Exam done elsewhere          Date: 03/04/2009          Results: normal          Done by: Outside optho   Impression & Recommendations:  Problem # 1:  DIABETES-TYPE 2 (ICD-250.00) Assessment Unchanged A1c= 6.1, stable from 5/11. No need for medication changes at this time. Has been slowly increasing, however, so  could consider increasing metformin to 1000mg  two times a day. Has had "high" CBGs (120s, 130s), but attributes this to diet. Counseled on the importance of keeping  good eating habits and exercising.  No signs/symptoms of diabetic neuropathy. Counseled pt on the importance of daily foot inspections and getting in to see opthomologist. Pt states she can only afford some medications. Wanted Metformin and HTN meds sent electronically to pharmacy and got printed scripts of everything else.   Her updated medication list for this problem includes:    Enalapril Maleate 20 Mg Tabs (Enalapril maleate) .Marland Kitchen... 2 tabs by mouth daily    Metformin Hcl 500 Mg Tabs (Metformin hcl) .Marland Kitchen... Take one tablet twice daily  Orders: A1C-[facility] ([address]) [facility]- Est  Level 4 ([address])  Problem # 2:  HYPERTENSION, BENIGN SYSTEMIC (ICD-401.1) Assessment: Deteriorated  BP elevated today. Pt attributes this to poor eating habits lately and no exercise. Can only afford some medications and stated her 2 HTN meds were priorities.  Will continue checking BP at home.  We will discuss increasing or changing medications if it continues to decline at next visit.  Will check Cr/K at next visit.   Her updated medication list for this problem includes:    Hydrochlorothiazide 25 Mg Tabs (Hydrochlorothiazide) .Marland Kitchen... Take 1 tablet by mouth once a day    Enalapril Maleate 20 Mg Tabs (Enalapril maleate) .Marland Kitchen... 2 tabs by mouth daily  Orders: [facility]- Est  Level 4 ([address])  Problem # 3:  HYPERCHOLESTEROLEMIA (ICD-272.0) Assessment: Comment Only  Will check FLP at next visit. Pt states she has been taking pravastatin every other day or every 3 days because of muscle aches.  Rx was printed and not electronically sent bc pt cannot afford to get all of her meds. Depending on results of FLP, will decide on appropriate course of action for HLD.  Her updated medication list for this problem includes:    Pravastatin Sodium 20 Mg Tabs (Pravastatin sodium)  .Marland Kitchen... 1 tab by mouth qhs for high cholesterol  Orders: [facility]- Est  Level 4 ([address]11)  Problem # 4:  Preventive Health Care (ICD-V70.0) Assessment: Comment Only Will defer PAP to next visit once pt has orange card (last 06/2007).  Will need mammo (last 09/2003) also. Discussed importance of healthy diet and starting to exercise again.   Complete Medication List: 1)  Hydrochlorothiazide 25 Mg Tabs (Hydrochlorothiazide) .... Take 1 tablet by mouth once a day 2)  Pravastatin Sodium 20 Mg Tabs (Pravastatin sodium) .Marland Kitchen.. 1 tab by mouth qhs for high cholesterol 3)  Trazodone Hcl 50 Mg Tabs (Trazodone hcl) .Marland Kitchen.. 1-2 tabs by mouth at bedtime as needed for sleep 4)  Flonase 50 Mcg/act Susp (Fluticasone propionate) .... 2 sprays each nostril daily during seasonal allergies 5)  Enalapril Maleate 20 Mg Tabs (Enalapril maleate) .... 2 tabs by mouth daily 6)  Omeprazole 40 Mg Cpdr (Omeprazole) .... One tablet by mouth daily 7)  Naprosyn 250 Mg Tabs (Naproxen) .... 2 tabs two times a day 8)  Hydrocodone-acetaminophen 5-500 Mg Tabs (Hydrocodone-acetaminophen) .... One tab q 4 hours as needed pain 9)  Metformin Hcl 500 Mg Tabs (Metformin hcl) .... Take one tablet twice daily  Hypertension Assessment/Plan:      The patient's hypertensive risk group is category C: Target organ damage and/or diabetes.  Her calculated 10 year risk of coronary heart disease is 11 %.  Today's blood pressure is 139/80.  Her blood pressure goal is < 130/80.  Patient Instructions: 1)  It was great meeting you today! 2)  Please come back and see me in February so we can do your PAP and draw some blood work. 3)  Please  return for a FASTING Lipid Profile one (1) week before your next appointment as scheduled. 4)  Try to exercise! It's important that you eat right and get moving so you can take care of yourself! Prescriptions: HYDROCODONE-ACETAMINOPHEN 5-500 MG TABS (HYDROCODONE-ACETAMINOPHEN) one tab q 4 hours as needed pain Brand medically  necessary #30 x -   Entered and Authorized by:   Demetria Pore MD   Signed by:   Demetria Pore MD on 09/03/2010   Method used:   Print then Give to Patient   RxID:   [id number] NAPROSYN 250 MG TABS (NAPROXEN) 2 tabs two times a day  #60 x 6   Entered and Authorized by:   Demetria Pore MD   Signed by:   Demetria Pore MD on 09/03/2010   Method used:   Print then Give to Patient   RxID:   [id number] OMEPRAZOLE 40 MG CPDR (OMEPRAZOLE) one tablet by mouth daily  #33 x 6   Entered and Authorized by:   Demetria Pore MD   Signed by:   Demetria Pore MD on 09/03/2010   Method used:   Print then Give to Patient   RxID:   [id number] FLONASE 50 MCG/ACT  SUSP (FLUTICASONE PROPIONATE) 2 sprays each nostril daily during seasonal allergies  #1 x 6   Entered and Authorized by:   Demetria Pore MD   Signed by:   Demetria Pore MD on 09/03/2010   Method used:   Print then Give to Patient   RxID:   [id number] TRAZODONE HCL 50 MG TABS (TRAZODONE HCL) 1-2 tabs by mouth at bedtime as needed for sleep  #60 x 6   Entered and Authorized by:   Demetria Pore MD   Signed by:   Demetria Pore MD on 09/03/2010   Method used:   Print then Give to Patient   RxID:   [id number] PRAVASTATIN SODIUM 20 MG TABS (PRAVASTATIN SODIUM) 1 tab by mouth qHS for high cholesterol  #33 x 3   Entered and Authorized by:   Demetria Pore MD   Signed by:   Demetria Pore MD on 09/03/2010   Method used:   Print then Give to Patient   RxID:   [id number] METFORMIN HCL 500 MG TABS (METFORMIN HCL) take one tablet twice daily  #64 x 2   Entered and Authorized by:   Demetria Pore MD   Signed by:   Demetria Pore MD on 09/03/2010   Method used:   Electronically to        [facility] (retail)       [address]       [place], [place]  [address]       Ph: [phone]       Fax: [phone]   RxID:   [id number] ENALAPRIL MALEATE 20  MG  TABS (ENALAPRIL MALEATE) 2 tabs by mouth daily  #66 x 2   Entered and Authorized by:   Demetria Pore MD   Signed by:   Demetria Pore MD on 09/03/2010   Method used:   Electronically to        [facility] (retail)       [address]       [place], [place]  [address]       Ph: [phone]       Fax: [phone]   RxID:   [id number] HYDROCHLOROTHIAZIDE 25 MG  TABS (HYDROCHLOROTHIAZIDE) Take 1 tablet by mouth once a day  #33 x 2   Entered and Authorized by:   Demetria Pore MD   Signed by:   Demetria Pore MD on 09/03/2010   Method used:   Electronically to        [facility] (retail)       [address]       [place], [place]  [address]       Ph: [phone]       Fax: [phone]   RxID:   [id number]    Orders Added: 1)  A1C-[facility] [830[address]] 2)  [facility]- Est  Level 4 [[address]]    Laboratory Results   Blood Tests   Date/Time Received: September 03, 2010 3:01 PM  Date/Time Reported: September 03, 2010 3:15 PM   HGBA1C: 6.1%   (Normal Range: Non-Diabetic - 3-6%   Control Diabetic - 6-8%)  Comments: ...............test performed by......Marland KitchenBonnie A. [place], MLS (ASCP)cm

## 2010-11-05 NOTE — Progress Notes (Signed)
Summary: Rx Req  Phone Note Refill Request Call back at Home Phone [phone] Message from:  Patient  Refills Requested: Medication #1:  ENALAPRIL MALEATE 20 MG  TABS 2 tabs by mouth daily  Medication #2:  PRAVASTATIN SODIUM 20 MG TABS 1 tab by mouth qHS for high cholesterol  Medication #3:  HYDROCHLOROTHIAZIDE 25 MG TABS Take 1 tablet by mouth once a day  Medication #4:  METFORMIN HCL 500 MG  TABS 1 tab by mouth daily for 1 week and then 1 tab by mouth two times a day. Omeprazole PT USES [facility] [address].  Initial call taken by: Clydell Hakim,  October 29, 2009 12:05 PM  Follow-up for Phone Call        will forward to MD. Follow-up by: Theresia Lo RN,  October 29, 2009 12:08 PM    Prescriptions: HYDROCHLOROTHIAZIDE 25 MG TABS (HYDROCHLOROTHIAZIDE) Take 1 tablet by mouth once a day  #33 x 6   Entered and Authorized by:   [name]  MD   Signed by:   [name]  MD on 10/29/2009   Method used:   Electronically to        [facility] (retail)       [address]       [place], [place]  [address]       Ph: [phone]       Fax: [phone]   RxID:   [id number] PRAVASTATIN SODIUM 20 MG TABS (PRAVASTATIN SODIUM) 1 tab by mouth qHS for high cholesterol  #33 x 3   Entered and Authorized by:   [name]  MD   Signed by:   [name]  MD on 10/29/2009   Method used:   Electronically to        [facility] (retail)       [address]       [place], [place]  [address]       Ph: [phone]       Fax: [phone]   RxID:   [id number] METFORMIN HCL 500 MG  TABS (METFORMIN HCL) 1 tab by mouth daily for 1 week and then 1 tab by mouth two times a day.  #66 x 6   Entered and Authorized by:   [name]  MD   Signed by:   [name]  MD on 10/29/2009   Method used:   Electronically to        [facility] (retail)       [address]       [place], [place]   [address]       Ph: [phone]       Fax: [phone]   RxID:   [id number] OMEPRAZOLE 40 MG CPDR (OMEPRAZOLE) one tablet by mouth daily  #33 x 6   Entered and Authorized by:   [name]  MD   Signed by:   [name]  MD on 10/29/2009   Method used:   Electronically to        [facility] (retail)       [address]       [place], [place]  [address]       Ph: [phone]       Fax: [phone]   RxID:  [id number] ENALAPRIL MALEATE 20 MG  TABS (ENALAPRIL MALEATE) 2 tabs by mouth daily  #66 x 6   Entered and Authorized by:   [name]  MD   Signed by:   [name]  MD on 10/29/2009   Method used:   Electronically to        [facility] (retail)       [address]       [place], [place]  [address]       Ph: [phone]       Fax: [phone]   RxID:   [id number]

## 2010-11-05 NOTE — Miscellaneous (Signed)
  Clinical Lists Changes  Problems: Removed problem of FOOT&TOE SUP FB W/O MAJ OPN WND&W/O MENTION INF (ICD-917.6) Removed problem of MENOPAUSAL SYNDROME (ICD-627.2) Removed problem of GLUCOSE INTOLERANCE (ICD-271.3) Removed problem of UNSPECIFIED HEMORRHAGE OF GASTROINTESTINAL TRACT (ICD-578.9)

## 2010-11-05 NOTE — Assessment & Plan Note (Signed)
Summary: pain right arm from elbow to hand/ls   Vital Signs:  Patient profile:   55 year old female Height:      64 inches Weight:      252 pounds BMI:     43.41 Temp:     98.0 degrees F oral Pulse rate:   77 / minute BP sitting:   122 / 66  (left arm) Cuff size:   large  Vitals Entered By: Tessie Fass, CMA (November 07, 2009 10:23 AM) CC: right arm pain x 5 days Is Patient Diabetic? Yes Pain Assessment Patient in pain? yes     Location: right arm Intensity: 5   Primary Care Provider:  [name]  MD  CC:  right arm pain x 5 days.  History of Present Illness: Woke up 5 days ago with severe pain in right elbow area and unable to move her arm functionally since because of pain.  She denies any trauma, any activity with repatitive motion (does type).  She has never had anything like this before.   Habits & Providers  Alcohol-Tobacco-Diet     Tobacco Status: quit  Allergies: 1)  Lovastatin (Lovastatin) 2)  Amoxicillin (Amoxicillin)  Review of Systems      See HPI  Physical Exam  General:  Alert, obese AA female in moderate pain, guarding right arm Msk:  Guarding right arm, holding elbow in flexion, tender over medial epicondyle, increased pain with pronation, wrist extension.   Impression & Recommendations:  Problem # 1:  MEDIAL EPICONDYLITIS, RIGHT (ICD-726.31)  Ice, NSAIDS, Hydrocodone/APAP, teaching...apt next week with [facility] for possible injection.  Very dramatic acute presentation for this problem.  Orders: [facility]- Est Level  3 ([address])  Complete Medication List: 1)  Hydrochlorothiazide 25 Mg Tabs (Hydrochlorothiazide) .... Take 1 tablet by mouth once a day 2)  Pravastatin Sodium 20 Mg Tabs (Pravastatin sodium) .Marland Kitchen.. 1 tab by mouth qhs for high cholesterol 3)  Trazodone Hcl 50 Mg Tabs (Trazodone hcl) .Marland Kitchen.. 1-2 tabs by mouth at bedtime as needed for sleep 4)  Flonase 50 Mcg/act Susp (Fluticasone propionate) .... 2 sprays each nostril daily during seasonal  allergies 5)  Metformin Hcl 500 Mg Tabs (Metformin hcl) .Marland Kitchen.. 1 tab by mouth daily for 1 week and then 1 tab by mouth two times a day. 6)  Enalapril Maleate 20 Mg Tabs (Enalapril maleate) .... 2 tabs by mouth daily 7)  Omeprazole 40 Mg Cpdr (Omeprazole) .... One tablet by mouth daily 8)  Naprosyn 250 Mg Tabs (Naproxen) .... 2 tabs two times a day 9)  Hydrocodone-acetaminophen 5-500 Mg Tabs (Hydrocodone-acetaminophen) .... One tab q 4 hours as needed pain  Patient Instructions: 1)  Apt with [facility]-Dr. Jennette Kettle next week for medial epicondylitis 2)  ICE often 3)  Take naprosyn twice daily and use hydrocodone carefully 4)  Rest arm on surface and turn over when pain in under control. Prescriptions: HYDROCODONE-ACETAMINOPHEN 5-500 MG TABS (HYDROCODONE-ACETAMINOPHEN) one tab q 4 hours as needed pain Brand medically necessary #30 x -   Entered and Authorized by:   Luretha Murphy NP   Signed by:   Luretha Murphy NP on 11/07/2009   Method used:   Print then Give to Patient   RxID:   [id number]

## 2010-11-05 NOTE — Progress Notes (Signed)
Summary: Rx Ques  Phone Note Call from Patient Call back at [facility] Phone [phone]   Caller: Patient Summary of Call: Pt has questions concerning some medication she is taking.   Initial call taken by: Clydell Hakim,  November 06, 2009 11:17 AM  Follow-up for Phone Call        patient has been off Pravastatin because it caused muscle aches in the past . after she and pcp discussed,  tried again. restarted last Wednedsay 10/29/2009. on 01/31 she woke up in the AM with  severe  pain in right arm form elbow to hand. she has been taking naprosyn and helps some. she stopped the pravastatin   on Monday.  continues with much discomfort . appointment scheduled tomorrow for evaluation. Follow-up by: Theresia Lo RN,  November 06, 2009 11:46 AM

## 2010-11-05 NOTE — Letter (Signed)
Summary: Generic Letter  [facility]     [place], [place]    Phone:   Fax:     07/10/2010  [name] [address] [place], [place]  [address]  Dear Ms. Diaz,   I am writing to inform you that you need to make an appointment to come in for your yearly well-woman visit, which includes a PAP smear. Your most recent PAP smear normal, but we like to do them every year. In addition, we also need to see how your blood pressure, cholesterol, and diabetes are doing. And, we need to set you up for a mammogram to screen for breast cancer as well as colon cancer screening.    Please call our office to schedule an appointment with me, Dr. Fara Boros, your new primary care doctor.   I look forward to meeting you!      Sincerely,   Demetria Pore MD  Appended Document: Generic Letter mailed.

## 2010-11-05 NOTE — Miscellaneous (Signed)
Summary: CHange prava dose  Clinical Lists Changes  Patient has 40s at home.  Will cut them in half until she runs out.  Does not need her other meds yet so will call when she is in need of refills and will send new Prava dose at that time.  See addendum to lab report for info of why dose was changed.  Medications: Changed medication from PRAVASTATIN SODIUM 40 MG TABS (PRAVASTATIN SODIUM) Take 1 tablet by mouth once a day to PRAVASTATIN SODIUM 20 MG TABS (PRAVASTATIN SODIUM) 1 tab by mouth qHS for high cholesterol - Signed Rx of PRAVASTATIN SODIUM 20 MG TABS (PRAVASTATIN SODIUM) 1 tab by mouth qHS for high cholesterol;  #0 x 0;  Signed;  Entered by: [name]  MD;  Authorized by: [name]  MD;  Method used: Historical    Prescriptions: PRAVASTATIN SODIUM 20 MG TABS (PRAVASTATIN SODIUM) 1 tab by mouth qHS for high cholesterol  #0 x 0   Entered and Authorized by:   [name]  MD   Signed by:   [name]  MD on 10/29/2009   Method used:   Historical   RxID:   [id number]

## 2010-12-21 ENCOUNTER — Other Ambulatory Visit: Payer: Self-pay | Admitting: *Deleted

## 2010-12-21 DIAGNOSIS — I10 Essential (primary) hypertension: Secondary | ICD-10-CM

## 2010-12-21 MED ORDER — METFORMIN HCL 500 MG PO TABS: 500.0000 mg | ORAL_TABLET | Freq: Two times a day (BID) | ORAL | Status: DC

## 2010-12-21 MED ORDER — ENALAPRIL MALEATE 20 MG PO TABS: ORAL_TABLET | ORAL | Status: DC

## 2010-12-21 MED ORDER — HYDROCHLOROTHIAZIDE 25 MG PO TABS: 25.0000 mg | ORAL_TABLET | Freq: Every day | ORAL | Status: DC

## 2010-12-23 ENCOUNTER — Telehealth: Payer: Self-pay | Admitting: Family Medicine

## 2010-12-23 NOTE — Telephone Encounter (Signed)
Called in prescriptions that were refilled on the 19th, patient informed.

## 2010-12-23 NOTE — Telephone Encounter (Signed)
Pt asking to speak with RN about 3 prescriptions she called in last week, looks like MD authorized them but pt says the pharmacy never received them. [facility] goes to [facility]

## 2011-01-11 ENCOUNTER — Other Ambulatory Visit (INDEPENDENT_AMBULATORY_CARE_PROVIDER_SITE_OTHER): Payer: Self-pay

## 2011-01-11 DIAGNOSIS — E119 Type 2 diabetes mellitus without complications: Secondary | ICD-10-CM

## 2011-01-11 DIAGNOSIS — I10 Essential (primary) hypertension: Secondary | ICD-10-CM

## 2011-01-11 DIAGNOSIS — E7800 Pure hypercholesterolemia, unspecified: Secondary | ICD-10-CM

## 2011-01-11 LAB — BASIC METABOLIC PANEL
BUN: 17 mg/dL (ref 6–23)
Calcium: 9.5 mg/dL (ref 8.4–10.5)
Creat: 0.69 mg/dL (ref 0.40–1.20)
Sodium: 141 mEq/L (ref 135–145)

## 2011-01-11 LAB — LIPID PANEL
HDL: 65 mg/dL (ref >39)
Total CHOL/HDL Ratio: 3.7 Ratio
Triglycerides: 174 mg/dL — ABNORMAL HIGH (ref <150)

## 2011-01-25 ENCOUNTER — Ambulatory Visit (INDEPENDENT_AMBULATORY_CARE_PROVIDER_SITE_OTHER): Payer: Self-pay | Admitting: Family Medicine

## 2011-01-25 ENCOUNTER — Encounter: Payer: Self-pay | Admitting: Family Medicine

## 2011-01-25 VITALS — BP 142/78 | HR 87 | Temp 97.9°F | Ht 64.0 in | Wt 255.1 lb

## 2011-01-25 DIAGNOSIS — R1012 Left upper quadrant pain: Secondary | ICD-10-CM

## 2011-01-25 DIAGNOSIS — E7800 Pure hypercholesterolemia, unspecified: Secondary | ICD-10-CM

## 2011-01-25 DIAGNOSIS — I10 Essential (primary) hypertension: Secondary | ICD-10-CM

## 2011-01-25 DIAGNOSIS — E119 Type 2 diabetes mellitus without complications: Secondary | ICD-10-CM

## 2011-01-25 NOTE — Patient Instructions (Addendum)
Come back and see me in 1 month so we can see how your side is feeling and do a good diabetes exam. Keep trying to eat right!  I will call you with the Fish Oil that you should take. Make sure you keep taking your blood pressure medicines every day!  We will also need to do a Pap smear too!!  AND MAKE SURE YOU GET YOUR MAMMOGRAM!!  Come back if the pain in your stomach becomes worse, if you cannot tolerate eating or drinking, or begin having bloody stools or bloody vomit.

## 2011-01-26 ENCOUNTER — Encounter: Payer: Self-pay | Admitting: Family Medicine

## 2011-01-26 DIAGNOSIS — R1012 Left upper quadrant pain: Secondary | ICD-10-CM | POA: Insufficient documentation

## 2011-01-26 MED ORDER — ENALAPRIL MALEATE 20 MG PO TABS: ORAL_TABLET | ORAL | Status: DC

## 2011-01-26 MED ORDER — HYDROCHLOROTHIAZIDE 25 MG PO TABS: 25.0000 mg | ORAL_TABLET | Freq: Every day | ORAL | Status: DC

## 2011-01-26 MED ORDER — OMEGA-3 FATTY ACIDS 1000 MG PO CAPS: 2.0000 g | ORAL_CAPSULE | Freq: Every day | ORAL | Status: AC

## 2011-01-26 MED ORDER — METFORMIN HCL 500 MG PO TABS: 500.0000 mg | ORAL_TABLET | Freq: Two times a day (BID) | ORAL | Status: DC

## 2011-01-26 NOTE — Assessment & Plan Note (Signed)
BP elevated inititally, improved on recheck, but still slightly elevated for pt w/ HTN and DM.  No med changes at this time.  Will continue to monitor. BMET WNL.

## 2011-01-26 NOTE — Assessment & Plan Note (Signed)
Unclear etiology at this time. No splenomegaly. No acute abdomen. No bowel changes.  Will continue monitoring at this time.  Unlikely to be cardiac etiology.  Will restart PPI and see if this improves symptoms.  Encouraged pt to pay attention to when pain comes and goes, and keep a record of what she is doing at those times.  F/u 1 month.

## 2011-01-26 NOTE — Assessment & Plan Note (Signed)
A1c 6.1, stable. Continue metformin. Encouraged pt to check CBGs. No med changes needed at this time.

## 2011-01-26 NOTE — Progress Notes (Signed)
S: Pt comes in with CC of stomach pain.  LUQ, deep, crampy pain, sometimes associated with food, but no definite trends.  Has bee occuring off and on for 1 month, but has been present all of the time for the past 4 days. 4-5/10 but tolerable. Nothing seems to make it better or worse.  Last BM today, no problems with constipation or diarrhea. No blood in her stool.  No epigastric discomfort, no burping or vomiting.  Pt only takes omeprazole occasionally depending on what she's going to eat (heavier meals she will take it).  Does take naproxen PRN but does not think that it makes her LUQ pain worse.   -DM: not checking CBGs b/c she ran out of strips-- does not think she needs Rx for these, but will call if she does; takes her metformin daily, maybe misses a dose 1x/mo; A1c 6.1  -HLD: Takes statin occasionally, but causes muscle cramps/achiness and muscle fatigue. Has not taken statin at all for the past 5 months.  Not willing to restart at this time despite poor FLP results.  Open to trying fish oil and discussing other options at next visit.  O: AF, VSS, repeat BP 142/78 Physical Examination: General appearance - alert, well appearing, and in no distress Chest - clear to auscultation, no wheezes, rales or rhonchi, symmetric air entry Heart - normal rate and regular rhythm, no murmurs noted Abdomen - soft, nontender, nondistended, no masses or organomegaly; MILD tenderness noted in LUQ, no epi tenderness, no palpable spleen bowel sounds normal no CVA tenderness Skin - normal coloration and turgor, no rashes, no suspicious skin lesions noted  A/P: For details see problem list. Briefly, started pt on PPI, given red flags for abdominal or cardiac symptoms that would require immediate intervention. Pt will start fish oil for lipids. F/u in 1 month for abd pain.

## 2011-01-26 NOTE — Assessment & Plan Note (Signed)
Pt not taking statin 2/2 side effects.  Has had muscle aches with multiple statins previously tried by Dr. Georgiana Shore.  Will have pt start fish oil and f/u about other cholesterol-lowering medication options.  Discussed importance of diet and exercise in order to prevent further medications to be needed.  Pt states understanding.   Could consider nutrition referral for elevated LDL, TG, and Cholesterol.

## 2011-02-16 NOTE — Discharge Summary (Signed)
[name], [name]               ACCOUNT NO.:  [id number]   MEDICAL RECORD NO.:  [id number]          PATIENT TYPE:  INP   LOCATION:  4705                         FACILITY:  [facility]   PHYSICIAN:  Wayne A. Sheffield Slider, M.D.    DATE OF BIRTH:  [birth year]   DATE OF ADMISSION:  09/14/2008  DATE OF DISCHARGE:  09/16/2008                               DISCHARGE SUMMARY   PRIMARY CARE Shamela Haydon:  [name], MD, [facility].   DISCHARGE DIAGNOSES:  1. Apical with chest pain.  2. Diabetes mellitus.  3. Hypertension.  4. Dyslipidemia.  5. Gastroesophageal reflux disease.  6. Depression.   DISCHARGE MEDICATIONS:  1. HCTZ 25 mg p.o. daily.  2. Trazodone 50 mg p.o. at bedtime p.r.n. insomnia.  3. Metformin 500 mg p.o. b.i.d.  4. Enalapril 40 mg p.o. daily.  5. Pravastatin 40 mg p.o. Monday, Wednesday, Friday.  6. Aspirin 81 mg p.o. daily.  7. Prilosec 40 mg p.o. daily.  8. Tums as needed.   DISCONTINUED MEDICATIONS:  None.   CONSULT:  Cardiology.   PROCEDURES:  Left heart catheterization, left ventricular angiogram.   LABS:  Cardiac enzymes negative x3.  Point of care negative x 1.  Fasting lipid panel, total cholesterol 245, triglycerides 194, HDL 71,  LDL 135.  BUN and sodium 136, potassium 3.7, BUN 11, creatinine 0.73.  CBC hemoglobin 13.0, hematocrit 39.7, platelets 320.   IMAGING:  EKG normal sinus rhythm with nonspecific T-wave abnormalities,  flattening of T-waves.  Chest x-ray; Impression: no acute disease.   BRIEF HOSPITAL COURSE:  A 55 year old female with history of  hypertension, diabetes mellitus, dyslipidemia, admitted for atypical  angina, rule out myocardial ischemia.   1.Chest pain.  The patient with recurrent atypical chest pain relieved  by both morphine and nitroglycerin, however, with negative cardiac  enzymes and unchanged EKG.  Primary care team decided the patient would  benefit from risk stratification with history of hypertension,  diabetes,  and dyslipidemia as well as family history of cardiac disease.  Cardiology was consulted during inpatient stay and because the patient  continued to have recurrent chest pain described as sharp pain on the  left side radiating to arms and jaw, cardiac catheterization was done.  Cardiac catheterization did not show any evidence of occlusion in the  coronary arteries.  Left ventricular angiogram showed a normal systolic  function of 50-55%, and there was no evidence of myocardial ischemia.  The patient will continue aspirin 81 mg p.o. daily.  Prior to discharge,  chest pain had resolved.  Primary team and Cardiology concluded that  chest pain was most likely noncardiac in nature and therefore, the  patient was started on omeprazole 40 mg daily with Tums as needed.  Of  note, the patient did have an episode of chest pain relieved by GI  cocktail.   1. Diabetes mellitus.  The patient had recent hemoglobin A1c of 6.1.      The patient was initially controlled during inpatient stay with      metformin p.o. b.i.d., however, when decision was made for cardiac  catheterization, metformin was held and the patient was started on      sliding scale insulin.  After cardiac catheterization, the patient      will continue metformin b.i.d. as outpatient.   3.Hypertension.  The patient's blood pressures were optimally controlled  on hydrochlorothiazide and enalapril.  The patient will continue  medications as outpatient.   1. Dyslipidemia.  The patient with history of muscle cramps with      increasing of statins.  Of note, the patient's LDL was not optimal      especially with history of hypertension and diabetes mellitus.      However, due to the patient's inability to tolerate increased doses      of statins and due to cough, we will continue with current regimen      of pravastatin every other day.  The patient will follow up with      PCP regarding any further changes in  statins.   5.GERD.  The patient was started on Protonix p.o. daily during  admission.  Of note, he had an episode of chest pain which was relieved  by GI cocktail.  After cardiac workup, diagnosis of atypical chest pain  was likely noncardiac in origin.  We will continue with proton pump  inhibitor and Tums needed as chest pain may have GI etiology.   1. Depression.  The patient was continued on trazodone.   DISCHARGE INSTRUCTIONS:  1. No heavy lifting or strenuous of exercise for the next 48 hours.  2. Heart-healthy diet.  3. Band-Aid over cardiac catheterization incision site.  4. Increase activity slowly.   FOLLOWUP APPOINTMENTS:  Dr. [name], [facility],  December 30.   DISCHARGE CONDITION:  Stable/improved.   DISCHARGE LOCATION:  Home.      Cheyenne Antis, MD  Electronically Signed      Arnette Norris. Sheffield Slider, M.D.  Electronically Signed    KD/MEDQ  D:  09/18/2008  T:  09/19/2008  Job:  [id number]   cc:   [name], MD  Verne Carrow, MD

## 2011-02-16 NOTE — H&P (Signed)
NAMEYESSIKA, OTTE               ACCOUNT NO.:  [id number]   MEDICAL RECORD NO.:  [id number]          PATIENT TYPE:  INP   LOCATION:  4705                         FACILITY:  [facility]   PHYSICIAN:  Wayne A. Sheffield Slider, M.D.    DATE OF BIRTH:  [birth year]   DATE OF ADMISSION:  09/14/2008  DATE OF DISCHARGE:                              HISTORY & PHYSICAL   HISTORY OF PRESENT ILLNESS:  The patient is a 55 year old female with  past medical history significant for type 2 diabetes, obesity,  hypertension, hyperlipidemia, and GERD, who presented with nonexertional  chest pain since yesterday.  The patient states that pain began  yesterday while she was sitting on her bed talking on the telephone, has  a substernal pressure, which radiated to her neck, jaw, and left arm.  Pain was worse with lying flat.  Pain was not worse with exertion.  Associated with this pain is intermittent dyspnea, which is worse with  exertion and worse with laying flat.  The patient also reported nausea  without emesis.  Denied diaphoresis.  No association with deep  breathing.  No association with meals.  The patient denied abdominal  pain, cough, upper respiratory infection symptoms, diarrhea, or  constipation.  The patient states the pain was constant until she went  to sleep last night.  The patient states that she was awoken with worse  chest pain this morning around 3 a.m. and came to the emergency room.  The pain was not reproducible.  Pain was somewhat relieved with aspirin  and nitroglycerin x3 in the emergency room.   CURRENT ALLERGIES:  1. LOVASTATIN causes leg cramps.  2. AMOXICILLIN, hives.   PAST MEDICAL HISTORY:  1. Type 2 diabetes.  2. Hypertension.  3. Hyperlipidemia.  4. GERD.  5. Cardiolite stress test on September 03, 2002, ejection fraction 61%,      no abnormality.  6. EKG from September 03, 2002, normal sinus rhythm, T-wave inversion in      leads I, II, and III, aVF, V3 through V6.  7. EKG from  September 2009, diffuse T-wave flattening, inversion in V4      and V6, but better R-wave progression.   FAMILY HISTORY:  Question of diabetes in mother, hypertension, no early  MI in mother or father.   SOCIAL HISTORY:  The patient is widowed, currently unemployed, has 1  grown son and daughter.  Quit smoking in 1998, after 1 pack per day x25  years.  No alcohol or drug use.   REVIEW OF SYSTEMS:  The patient denies anorexia, fever, weight loss,  weight gain, vision loss, decreased hearing, hoarseness, syncope,  peripheral edema, prolonged cough, headaches, hemoptysis, abdominal  pain, melena, hematochezia, hematuria, difficulty walking, slurring  speech, focal weakness, or paroxysmal nocturnal dyspnea.   PHYSICAL EXAMINATION:  VITAL SIGNS:  Temperature 98.1, heart rate 58-81,  blood pressure 144-166 over 69-82, respiratory rate 16-19, and oxygen  saturation 100% on 2 L nasal cannula.  Note that oxygen saturation was  98% on room air on admission.  GENERAL:  Obese, alert, in no acute distress.  HEAD:  Normocephalic and atraumatic without obvious abnormalities.  EYES:  No corneal or conjunctival inflammation noted.  EOMI.  PERRL.  Funduscopic exam within normal limits.  Vision is grossly normal.  EARS:  No external or internal abnormalities noted.  NOSE:  No external deformities.  MOUTH:  Oral mucosa and oropharynx without lesions or exudates.  Teeth  in poor repair.  NECK:  No deformities, masses, or tenderness noted.  No JVD or carotid  bruits.  CHEST WALL:  No tenderness or masses noted.  LUNGS:  Normal respiratory effort, chest expands symmetrically.  Lungs  clear to auscultation.  No crackles or wheezes.  No accessory muscle  use.  HEART:  Normal rate and regular rhythm.  S1 and S2 normal without  gallop, murmur, click, rub, or extra heart sounds.  No JVD.  A 2+ radial  and dorsalis pedis pulses bilaterally.  Less than 2-second capillary  refill.  ABDOMEN:  Soft and  nontender.  Normal bowel sounds.  No guarding and no  rigidity.  MUSCULOSKELETAL:  A 5/5 strength of the abduction and adduction, flexion  and extension at shoulders, 5/5 strength hip flexion and extension, knee  flexion and extension, dorsi and plantar flexion, grip strength within  normal limits all bilaterally.  Pulses, 2+ radial and dorsalis pedis  pulses.  EXTREMITIES:  No edema or cyanosis.  NEUROLOGIC:  No cranial nerve deficits noted.  Sensory, motor, and  coordinative functions all intact.  SKIN:  Intact without suspicious lesions or rashes.  CERVICAL NODES:  No lymphadenopathy noted.  PSYCHIATRY:  Cognition and judgment intact.  Alert and cooperative.   LABORATORY DATA:  Sodium 136, potassium 3.7, chloride 101, bicarb 26,  BUN 11, creatinine 0.73, glucose 131.  White blood cell 7.4, hemoglobin  13.0, hematocrit 39.7, and platelets 320.  Calcium 8.9.  Point-of-care  cardiac enzymes negative x1.   STUDIES:  Chest x-ray 2-view heart upper limit of normal, no acute  cardiopulmonary disease.   EKG; normal sinus rhythm, rate 70, normal axis, PR and QRS intervals  normal.  No ST elevation or depression.  Diffuse T-wave flattening.  Poor R-wave progression, V2 through V4 anterior leads.   ASSESSMENT AND PLAN:  This is a 55 year old female with past medical  history significant for type 2 diabetes, obesity, hypertension,  hyperlipidemia, and GERD, who presents with atypical chest pain.   1. Atypical chest pain given that it is substernal, relieved by      nitroglycerin, however, is nonexertional.  Point-of-care cardiac      enzymes negative x1.  EKG with diffuse T-wave flattening.      Unchanged from prior.  The patient with multiple risk factors for      coronary artery disease.  TIMI score of 3.  We will cycle cardiac      enzymes x3 q.6 h.  We will check fasting lipid panel.  Last A1c      6.1.  We will continue aspirin and pravastatin.  We will give      nitroglycerin,  morphine as needed for pain.  We will recheck EKG in      the morning.  We will obtain 2-D echocardiogram given complaints of      orthopnea and chest pain when laying flat.  Consider Cardiology      consult in this patient with multiple risk factors.  We will not      start beta-blocker at this time given heart rate in 50s.  We will  start PPI given pain worse when laying flat.  Consider      gastrointestinal cocktail if pain recurs.  Admit to telemetry bed.      We will check coags.  Chest x-ray negative for acute      cardiopulmonary disease.  Wells score is 0.  2. Type 2 diabetes.  We will continue metformin and enalapril given      good renal function for now.  Last A1c 6.1 indicates the patient is      well controlled.  Creatinine 0.73, baseline 0.71.  3. Hypertension.  We will continue home hydrochlorothiazide and      enalapril.  We will follow blood pressures.  Consider beta-blocker      if heart rate will tolerate.  4. Hypercholesterolemia.  We will check fasting lipid panel.  Continue      pravastatin every other day given history of leg cramps, pains with      statin.  5. Depressive disorder, not otherwise specified.  We will continue      trazodone as needed for sleep.  The problem is stable.  6. Fluids, electrolytes, nutrition, and gastrointestinal.  Heart-      healthy, carb-modified diet.  Hep-Lock IV.  7. Gastroesophageal reflux disease.  We will start Protonix 40 mg p.o.      b.i.d.  8. Prophylaxis.  We will encourage early ambulation given that this      patient is likely to be discharged prior to 24-hour mark.  9. Disposition.  Pending.  Rule out myocardial infarction.  The      patient should have outpatient workup of chest pain once myocardial      infarction is ruled out.  The patient should also have follow up      with primary care physician.      Bobby Rumpf, MD  Electronically Signed      Arnette Norris. Sheffield Slider, M.D.  Electronically Signed    KC/MEDQ   D:  09/14/2008  T:  09/15/2008  Job:  [id number]

## 2011-02-16 NOTE — Consult Note (Signed)
[name], LOVE               ACCOUNT NO.:  [id number]   MEDICAL RECORD NO.:  [id number]          PATIENT TYPE:  INP   LOCATION:  4705                         FACILITY:  [facility]   PHYSICIAN:  Duke Salvia, MD, FACCDATE OF BIRTH:  12/30/1955   DATE OF CONSULTATION:  09/15/2008  DATE OF DISCHARGE:                                 CONSULTATION   PRIMARY CARDIOLOGIST:  [facility].   PRIMARY CARE Ballard Budney:  [facility].   PATIENT PROFILE:  A 55 year old African American female with a history  of normal coronary arteries by catheterization in August 2005, whom we  are asked to evaluate for recurrent chest pain.   PROBLEMS:  1. Chest pain.      a.     Status post catheterization on June 01, 2004, revealing       normal coronary arteries with an ejection fraction of 55%.  2. Hypertension.  3. Hyperlipidemia.  4. Type 2 diabetes mellitus.  5. Morbid obesity.  6. Gastroesophageal reflux disease.  7. Remote tobacco abuse, 25-pack-year history, quitting in 1998.   HISTORY OF PRESENT ILLNESS:  A 55 year old African American female with  history of chest pain and normal catheterization in August 2005.  Since  catheterization, the patient has had intermittent resting exertional  chest pain associated with dyspnea occurring once every 2-3 months,  lasting 1-10 minutes and resolving spontaneously.  She does also have  progressive dyspnea on exertion now, that occurs with minimal activity.  Approximately, 3 weeks ago, she had an episode of chest pain, which she  felt it was worse than usual and this lasted 10 minutes.  Since this  past Friday, she has been having intermittent symptoms several times a  day lasting anywhere between 1 and 10 minutes and resolving  spontaneously.  In the morning of September 14, 2008, she woke at 3 a.m.  with substernal chest heaviness associated with nausea, dyspnea, and  orthopnea.  After 4 hours of ongoing symptoms, she  presented to the  [facility] ED where ECG was without acute changes and cardiac markers  were negative.  It is treated with nitroglycerin x3, with relief of  chest pain.  She was admitted and subsequently ruled out.  She has had  recurrent chest pain since admission, managed successfully both with  nitroglycerin and GI cocktail.  She is currently pain free.   ALLERGIES:  LOVASTATIN causes cramps and AMOXICILLIN causes hives.   MEDICATIONS:  1. Aspirin 81 mg daily.  2. Vasotec 40 mg daily.  3. Hydrochlorothiazide 25 mg daily.  4. Glucophage 500 mg b.i.d.  5. Protonix 40 mg b.i.d.  6. Pravachol 40 mg every Monday, Wednesday, and Friday.   FAMILY HISTORY:  Mother died at 14 with end-stage renal disease.  Father  is alive at 82 with hypertension.  She has 2 brothers and a sister, all  are alive and well.   SOCIAL HISTORY:  She lives in [place] with her son.  She is an  unemployed IT consultant.  She denies alcohol or drug use.  She is not  routinely exercising.  REVIEW OF SYSTEMS:  Positive for chest pain, dyspnea on exertion, and  orthopnea.  Otherwise, all systems reviewed and are negative.   PHYSICAL EXAMINATION:  VITAL SIGNS:  Temperature 97.5, heart rate 67,  respirations 20, blood pressure 112/49, pulse ox 100% on 2 L, weight is  113.9 kg, intake 460, and output 675.  GENERAL:  Pleasant African American female, in no acute distress.  Awake, alert, and oriented x3.  HEENT:  Normal.  Nares grossly intact, nonfocal.  SKIN:  Warm and dry without lesions or masses.  MUSCULOSKELETAL:  Notable for good range of motion of bilateral upper  and lower extremities without deformity or effusion.  NECK:  No bruits or JVD.  LUNGS:  Respirations are regular and unlabored.  Clear to auscultation.  CARDIAC:  Regular S1and S2.  No S3, S4, or murmurs.  ABDOMEN:  Round, soft, nontender, and nondistended.  Bowel sounds  present x4.  EXTREMITIES:  Warm and dry.  No clubbing, cyanosis, or edema.   Dorsalis  pedis and posterior tibial pulses are 2+ and equal bilaterally.   Hemoglobin 13.0, hematocrit 39.7, WBC 7.4, and platelets 320.  INR is  0.9.  Sodium 136, potassium 3.7, chloride 101, CO2 of 26, BUN 11,  creatinine 0.73, glucose 130, and calcium 8.9.  Cardiac markers negative  x3.  Total cholesterol 245, triglycerides 194, HDL 51, and LDL 135.  Chest x-ray, no acute disease.  Her EKG shows sinus rhythm, rate is 65,  inferolateral T-wave inversion, poor R-wave progression, all of which is  old.   ASSESSMENT AND PLAN:  1. Chest pain, typical and atypical features.  She had normal coronary      arteries on catheterization in 2005.  Suspect Myoview would be low      yield secondary to body habitus and ongoing symptoms.  She has      multiple risk factors including hypertension, hyperlipidemia,      diabetes, obesity, and remote tobacco abuse and as a result, we      will plan cardiac catheterization in the a.m.  Continue aspirin and      statin.  We will hold her metformin.  2. Hypertension, stable on ACE inhibitor.  3. Hyperlipidemia.  She is on Pravachol at home and her LDL is still      elevated at 135.  We would consider      alternate agent if caused and/or tolerance not an issue, although      both appeared to be.  4. Diabetes mellitus, per Internal Medicine.  Hold the Glucophage.      Pending cath.  5. Obesity.  Dietary consultation.      [name], ANP      Duke Salvia, MD, [facility]  Electronically Signed    CB/MEDQ  D:  09/15/2008  T:  09/16/2008  Job:  [id number]

## 2011-02-16 NOTE — Cardiovascular Report (Signed)
[name], [name]               ACCOUNT NO.:  [id number]   MEDICAL RECORD NO.:  [id number]          PATIENT TYPE:  INP   LOCATION:  4705                         FACILITY:  [facility]   PHYSICIAN:  Verne Carrow, MDDATE OF BIRTH:  [birth year]   DATE OF PROCEDURE:  09/16/2008  DATE OF DISCHARGE:  09/16/2008                            CARDIAC CATHETERIZATION   PROCEDURE PERFORMED:  1. Left heart catheterization.  2. Selective coronary angiography.  3. Left ventricular angiogram.   OPERATOR:  Verne Carrow, MD   DETAILS OF PROCEDURE:  The patient was brought to cardiac  catheterization laboratory after signing informed consent.  The right  groin was prepped and draped in a sterile fashion.  Lidocaine 1% was  used for local anesthesia.  A 5-French sheath was inserted into the  right femoral artery without difficulty.  Standard diagnostic catheters  were used to selectively engage and inject the left coronary system and  the right coronary artery.  A 5-French pigtail catheter was used to  cross the area of valve into the left ventricle.  Following performance  of the left ventricular angiogram, the pigtail catheter was pulled back  across the aortic valve with no significant pressure gradient measure.  An Angio-Seal femoral artery closure device was placed in the right  femoral artery.   ANGIOGRAPHIC FINDINGS:  1. The left main coronary artery bifurcation to the circumflex, ramus      intermedius, and the LAD.  There is no evidence of disease in the      left main coronary artery.  2. Left anterior descending is a large vessel that courses to the apex      and gives off large diagonal branch.  There is no evidence of      disease in the left anterior descending system.  3. The circumflex artery is a large codominant vessel that gives up an      early ramus intermedius branch and a large bifurcating obtuse      marginal branch.  The AV group circumflex is a  small-to-moderate      sized artery.  There is no evidence of disease in the left      circumflex system.  4. The right coronary artery is a codominant vessel, but has no      angiographic evidence of disease.  5. Left ventricular angiogram demonstrates normal systolic function of      the left ventricle with an ejection fraction of 50-55%.   HEMODYNAMIC DATA:  Central aortic pressure 128/68, left ventricular  pressure 129/2, end-diastolic pressure 11.   IMPRESSION:  1. No angiographic evidence of coronary artery disease.  2. Normal left ventricular systolic function.   RECOMMENDATIONS:  I do no recommend any further cardiac workup of this  patient's chest pain.  Non-cardiac etiology of the chest pain should be  considered.      Verne Carrow, MD  Electronically Signed     CM/MEDQ  D:  09/16/2008  T:  09/17/2008  Job:  [id number]

## 2011-02-19 NOTE — Procedures (Signed)
[facility]  Patient:    [name], [name]                 MRN: [id number] Proc. Date: 08/17/00 Adm. Date:  [id number] Disc. Date: [id number] Attending:  Devoria Albe CC:         Linus Galas, M.D.   Procedure Report  PROCEDURE:  Colonoscopy with polypectomy.  INDICATIONS FOR PROCEDURE:  Change in stool caliber in a 55 year old patient with a history of colon polyps in a first degree relative.  DESCRIPTION OF PROCEDURE:   The patient was placed in the left lateral decubitus position then placed on the pulse monitor with continuous low flow oxygen delivered by nasal cannula. She was sedated with 90 mg IV Demerol and 9 mg IV Versed. The Olympus video colonoscope was inserted into the rectum and advanced to the cecum, confirmed by transillumination of McBurneys point and visualization of the ileocecal valve and appendiceal orifice. The prep was excellent. The cecum appeared normal. Within the ascending, transverse, descending, and to a lesser degree sigmoid colon, there were numerous diverticula. In the sigmoid colon at 35 cm, there was a 1 cm sessile polyp which was fulgurated by hot biopsy. Similar polyps were seen at 25 cm and 12 cm and were also hot biopsied. The remainder of the rectum appeared normal and retroflexed view of the anus revealed no obvious internal hemorrhoids. The colonoscope was then withdrawn and the patient returned to the recovery room in stable condition. The patient tolerated the procedure well and there were no immediate complications.  IMPRESSION: 1. 2 sigmoid and 1 rectal polyp. 2. Diverticulosis primarily of the ascending and transverse colon extending    into the sigmoid colon.  PLAN:  Await histology for determination of interval for future surveillance colonoscopies. DD:  08/17/00 TD:  08/17/00 Job: [id number]

## 2011-02-19 NOTE — Consult Note (Signed)
NAMEMAIDA, WIDGER                           ACCOUNT NO.:  [id number]   MEDICAL RECORD NO.:  [id number]                   PATIENT TYPE:  INP   LOCATION:  3731                                 FACILITY:  [facility]   PHYSICIAN:  Meade Maw, M.D.                 DATE OF BIRTH:  Aug 06, 1956   DATE OF CONSULTATION:  DATE OF DISCHARGE:                                   CONSULTATION   CONSULTING PHYSICIAN:  Meade Maw, M.D.   REFERRING PHYSICIAN:  Lurena Joiner L. Jolinda Croak, M.D.   INDICATION FOR CONSULT:  Chest pain.   HISTORY:  [name] is a 55 year old female who was admitted on May 26, 2004  with complaints of substernal chest pressure.  Initial onset of pressure  occurred approximately one month prior.  The chest pressure has been  intermittent, occurs with exertion, is somewhat relieved with rest; however,  she continues to workout at [employer] three days per week.  This has not  exacerbated the chest pain.  During the past two days her chest pain has  become more severe and radiated down her arm.  She has also had some  shortness of breath and nausea.  She presented to the emergency room, was  given one sublingual nitroglycerin with relief.  The chest pain returned  later while in the emergency room, and she was subsequently started on IV  nitroglycerin.  I have previously evaluated [name] in the office in 2003 for  complaints of chest pain.  This was described more as sharpness in her  chest.  She was noted to have a normal Cardiolite at that time.  Ejection  fraction was 61%.   PAST MEDICAL HISTORY:  1. Hypertension.  2. Dyslipidemia.  3. GERD.  4. Obesity.  5. Glucose intolerance.  6. Bilateral tubal ligation, June 2001.   OUTPATIENT MEDICATIONS:  1. Aspirin 81 mg daily.  2. Flonase two puffs b.i.d.  3. __________  mg p.o. q.h.s.  4. Trazodone 50 mg p.o. q.h.s. p.r.n.  5. Zantac 150 mg p.r.n.  6. Hydrochlorothiazide 25 mg daily.  7. Multivitamin.  8. Voltaren p.r.n.  9.  Zyrtec 10 mg p.r.n.   ALLERGIES:  PENICILLIN.   FAMILY HISTORY:  Mother passed from cardiomyopathy, diabetes.  Father's  health history unknown.   SOCIAL HISTORY:  She is a recent widow since December.  Her husband who was  a former patient of mine passed from a massive myocardial infarction.  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  She stopped  smoking in 1998.  No history of alcohol use.   REVIEW OF SYSTEMS:  She has had increase in fatigue.   PHYSICAL EXAMINATION:  GENERAL:  Reveals a middle-aged female in no acute  distress.  VITAL SIGNS:  Blood pressure is 100/60, heart rate is 65.  She is afebrile.  O2 sat is 97% on room air.  HEENT:  Unremarkable.  She has good carotid upstrokes.  No carotid bruits.  PULMONARY:  Reveals breath sounds which are equal, clear to auscultation.  CARDIOVASCULAR:  Reveals a regular rate and rhythm.  Normal S1, normal S2.  No rubs, murmurs, gallops noted.  ABDOMEN:  Soft, obese, nontender.  No unusual bruits noted.  EXTREMITIES:  Revealed no peripheral edema.  Distal pulses are palpable.   Her ECG reveals a normal sinus rhythm at 60.  There are diffuse T waves  noted in the inferolateral leads.  These are unchanged from 2003.   Chest x-ray reveals no acute disease.   LABORATORY DATA:  Potassium of 2.9.  This has been replaced.  Repeat B-MET  is pending.  PH of 7.41, pCO2 of 42, bicarbonate 28.  White count is 7.2,  hemoglobin 11.6, hematocrit 34, platelet count __________  .  Sodium is 142,  chloride is 106, glucose 168, creatinine is 0.9.  Point-of-care markers were  negative.  Her initial set of enzymes revealed a CK of 134 with a CK-MB of  1.2.   IMPRESSION:  1. Chest pressure with features which are typical and atypical for cardiac.     We discussed the options of proceeding directly to cath versus repeating     the Cardiolite.  [name] wishes to proceed with Cardiolite.  I feel that     this is appropriate in  that the pain has been ongoing for approximately     one month and has not been aggravated by her exertional history.  I agree     with current medication, would discontinue intravenous nitroglycerin and     oxygen at this time.  Would continue with heparin and aspirin.  2. Hypertension.  Blood pressure is well controlled.  3. Dyslipidemia.  Fasting lipid profile is pending.  4. Glucose intolerance.  Hemoglobin A1c is pending.  5. Hypokalemia most likely related to hydrochlorothiazide.  Will most likely     need daily potassium replacement.   Further recommendations pending the outcome of her stress Cardiolite.                                               Meade Maw, M.D.    HP/MEDQ  D:  05/27/2004  T:  05/27/2004  Job:  [id number]   cc:   Lurena Joiner L. Jolinda Croak, M.D.  [address] [place]  [place] [address]  Fax: [phone]

## 2011-02-19 NOTE — Cardiovascular Report (Signed)
[name], [name]                           ACCOUNT NO.:  [id number]   MEDICAL RECORD NO.:  [id number]                   PATIENT TYPE:  INP   LOCATION:  3731                                 FACILITY:  [facility]   PHYSICIAN:  Meade Maw, M.D.                 DATE OF BIRTH:  1955/12/03   DATE OF PROCEDURE:  DATE OF DISCHARGE:                              CARDIAC CATHETERIZATION   REFERRING PHYSICIAN:  Rebecca L. Jolinda Croak, M.D.   INDICATION FOR PROCEDURE:  Inducible chest pain on treadmill with suboptimal  exercise stress test.   PROCEDURE IN DETAIL:  After obtaining written informed consent, the patient  was brought to the cardiac catheterization lab in a postabsorptive state.  Preop sedation was achieved using IV Versed.  The right groin was prepped  and draped in the usual sterile fashion.  Local anesthesia was achieved  using 1% Xylocaine.  A Smart needle was used to obtain access into the right  femoral artery.  A 6 French hemostasis sheath was placed into the right  femoral artery using a modified Seldinger technique.  Selective coronary  angiography was performed using a JL4, JL4, and a non-torque right.  There  was initial spasm following first injection in the right coronary artery.  The patient was given 200 mcg of intracoronary nitroglycerin and  non-torque  right was obtained with injections for the RAO view.  All catheter exchanges  were made over a guidewire.  ACT was obtained at the end of the procedure.  ACT was found to be 194.  The patient was transferred to the holding area.  The hemostasis sheaths will be removed, hemostasis achieved using digital  pressure.   FINDINGS:  1.  The aortic pressure is 128/64.  2.  LV pressure is 125/4.  3.  The EDP is 12.  4.  Single-plane ventriculogram revealed mild hypokinesis, ejection fraction      of 50-55%.   CORONARY ANGIOGRAPHY:  1.  The left main coronary artery bifurcates into the left anterior      descending and  the circumflex vessel.  There is no significant disease      in the left main coronary artery.  2.  Left anterior descending:  The left anterior descending gives rise to a      large D1, goes on in as an apical branch.  There is no disease noted in      the left anterior descending or its branches.  3.  Circumflex vessel:  The circumflex vessel is co-dominant for the      posterior circulation, gives rise to a large OM-1, a large bifurcating      OM-2, goes on to end as an AV groove vessel.  There is no disease noted      in the circumflex or its branch.  4.  Right coronary artery:  The right coronary artery is co-dominant for the  posterior circulation.  It gives rise to a trivial RV marginal and a      small PDA branch.  There is no disease noted in the right coronary      artery or its branches.   FINAL IMPRESSION:  1.  Normal coronary angiography.  2.  Mild hypokinesis, ejection fraction 55%.   RECOMMENDATIONS:  Consider other etiologies for her chest pain.                                               Meade Maw, M.D.    HP/MEDQ  D:  06/01/2004  T:  06/02/2004  Job:  [id number]   cc:   Lurena Joiner L. Jolinda Croak, M.D.  [address] [place]  [place] [address]  Fax: [phone]

## 2011-02-19 NOTE — Discharge Summary (Signed)
[name], [name]                           ACCOUNT NO.:  [id number]   MEDICAL RECORD NO.:  [id number]                   PATIENT TYPE:  INP   LOCATION:  3731                                 FACILITY:  [facility]   PHYSICIAN:  [name], M.D.             DATE OF BIRTH:  1956/02/21   DATE OF ADMISSION:  05/26/2004  DATE OF DISCHARGE:  06/02/2004                                 DISCHARGE SUMMARY   DISCHARGE DIAGNOSES:  1.  Noncardiac chest pain.  2.  Hypercholesterolemia.  3.  Hypertension.  4.  Gastroesophageal reflux disease.   CONSULTS:  Dr. Fraser Din with [facility] __________ EKG with Cardiolite  showing negative for ischemia.  Ejection fraction 67% and a cardiac  catheterization with a preliminary report reading mild hypokinesis and  ejection fraction of 50% and normal coronary arteries.   DISCHARGE MEDICATIONS:  1.  Aspirin 81 mg one p.o. daily.  2.  Caltrate one p.o. daily.  3.  Flonase two puffs per nostril p.o. daily.  4.  HCTZ 25 mg one p.o. daily.  5.  Lipitor 20 mg one p.o. daily.  6.  Multivitamins one p.o. daily.  7.  Trazodone 50 mg one or two tablets p.o. q.h.s. p.r.n. insomnia.  8.  Zantac 150 mg one p.o. daily.  9.  Zyrtec 10 mg one p.o. daily.  10. Toprol 25 mg one p.o. b.i.d.   DISPOSITION:  Discharged to home.  Follow up with Dr. Raquel James on September  8 at 1:30 p.m. at [facility].  Instructed to follow a  heart healthy diet and activities can be resumed as per cardiology relating  to her cardiac catheterization.   HOSPITAL COURSE:  This is a 55 year old African-American female who comes in  complaining of chest pain.  The pain started the day prior to admission.  It  was retrosternal in nature.  Patient described the pain as an intensity of 3  and intermittent.  The day of admission became more constant.  She does  describe shortness of breath, chills, nausea.  She does have history of  gastroesophageal reflux disease  but this pain is different.  This pain has  been on and off for approximately one month.  She received nitroglycerin x3  which partially relieved the pain in the ED.  She also received morphine and  Tylenol.   #1 - CHEST PAIN:  Patient was initially felt to have atypical coronary chest  pain.  However, upon the review with her cardiologist, she underwent a  Cardiolite stress test that was negative.  There is some question, however,  as to a complete read of her stress test so a cardiac catheterization was  scheduled.  She underwent the cardiac catheterization a day prior to  discharge which showed the mild hypokinesis, EF 50%, normal coronary  arteries.  I will await the full report.  At discharge she did have cardiac  enzymes that were negative x3 as well as point of care enzymes that were  negative x3.  She underwent a fasting lipid profile showing a total  cholesterol of 213, triglycerides of 181, HDL of 57, and LDL slightly  elevated at 010.  Hemoglobin A1C was 5.5.  She was started on Lopressor and  given nitroglycerin during the hospitalization.  She generally did not have  very good relief with the nitroglycerin during her hospitalization so this  will not be continued, particularly as she does not have coronary disease.  We will continue her antihypertensives, however.   #2 - HYPERCHOLESTEROLEMIA:  Her LDL was noted to be above the optimal range  for her. Therefore, her dose of Lipitor was increased from 10 to 20.  We  will follow up on this as an outpatient, recheck her lipids to be certain  she is getting in her __________ .   #3 - HYPERTENSION:  Her blood pressure was well controlled with medications  in hospital and we will continue these at discharge.   #4 - GASTROESOPHAGEAL REFLUX DISEASE:  The patient __________ that most of  her pain is quite different than what she described.  Uncertain as to  whether or not GERD could be playing a part but we will continue her on  her  Zantac at discharge.   #5 - GLUCOSE INTOLERANCE:  Patient did have some elevated blood sugars  during hospitalization.  Her hemoglobin A1C was 5.5 and we feel that  currently dietary and exercise management of her glucose would be most  appropriate and she is agreeable at this time.      Cheyenne Beath, MD                     [name], M.D.    JT/MEDQ  D:  06/02/2004  T:  06/02/2004  Job:  [id number]

## 2011-04-14 ENCOUNTER — Other Ambulatory Visit: Payer: Self-pay | Admitting: Family Medicine

## 2011-04-14 DIAGNOSIS — Z1231 Encounter for screening mammogram for malignant neoplasm of breast: Secondary | ICD-10-CM

## 2011-04-28 ENCOUNTER — Ambulatory Visit (HOSPITAL_COMMUNITY)
Admission: RE | Admit: 2011-04-28 | Discharge: 2011-04-28 | Disposition: A | Discharge status: Home / Self Care | Payer: Self-pay | Source: Ambulatory Visit | Attending: Family Medicine | Admitting: Family Medicine

## 2011-04-28 DIAGNOSIS — Z1231 Encounter for screening mammogram for malignant neoplasm of breast: Secondary | ICD-10-CM

## 2011-05-27 ENCOUNTER — Encounter: Payer: Self-pay | Admitting: Family Medicine

## 2011-05-27 ENCOUNTER — Ambulatory Visit (INDEPENDENT_AMBULATORY_CARE_PROVIDER_SITE_OTHER): Payer: Self-pay | Admitting: Family Medicine

## 2011-05-27 VITALS — BP 152/74 | HR 81 | Ht 64.0 in | Wt 262.0 lb

## 2011-05-27 DIAGNOSIS — R5381 Other malaise: Secondary | ICD-10-CM

## 2011-05-27 DIAGNOSIS — E7800 Pure hypercholesterolemia, unspecified: Secondary | ICD-10-CM

## 2011-05-27 DIAGNOSIS — I10 Essential (primary) hypertension: Secondary | ICD-10-CM

## 2011-05-27 DIAGNOSIS — E119 Type 2 diabetes mellitus without complications: Secondary | ICD-10-CM

## 2011-05-27 DIAGNOSIS — R5383 Other fatigue: Secondary | ICD-10-CM

## 2011-05-27 DIAGNOSIS — E669 Obesity, unspecified: Secondary | ICD-10-CM

## 2011-05-27 DIAGNOSIS — K219 Gastro-esophageal reflux disease without esophagitis: Secondary | ICD-10-CM

## 2011-05-27 LAB — CBC
HCT: 40.4 % (ref 36.0–46.0)
Hemoglobin: 12.4 g/dL (ref 12.0–15.0)
MCH: 27.4 pg (ref 26.0–34.0)
MCHC: 30.7 g/dL (ref 30.0–36.0)
MCV: 89.2 fL (ref 78.0–100.0)
Platelets: 374 10*3/uL (ref 150–400)
RBC: 4.53 MIL/uL (ref 3.87–5.11)
RDW: 15.6 % — ABNORMAL HIGH (ref 11.5–15.5)
WBC: 8.1 10*3/uL (ref 4.0–10.5)

## 2011-05-27 LAB — COMPREHENSIVE METABOLIC PANEL
ALT: 14 U/L (ref 0–35)
Alkaline Phosphatase: 58 U/L (ref 39–117)
Sodium: 140 mEq/L (ref 135–145)
Total Bilirubin: 0.4 mg/dL (ref 0.3–1.2)
Total Protein: 7.2 g/dL (ref 6.0–8.3)

## 2011-05-27 MED ORDER — OMEPRAZOLE 40 MG PO CPDR: 40.0000 mg | DELAYED_RELEASE_CAPSULE | Freq: Every day | ORAL | Status: DC

## 2011-05-27 MED ORDER — ROSUVASTATIN CALCIUM 10 MG PO TABS: 10.0000 mg | ORAL_TABLET | Freq: Every day | ORAL | Status: DC

## 2011-05-27 MED ORDER — FLUTICASONE PROPIONATE 50 MCG/ACT NA SUSP: 1.0000 | Freq: Every day | NASAL | Status: DC

## 2011-05-27 NOTE — Progress Notes (Signed)
S: Pt comes in today for follow up.  HYPERTENSION BP: 175/81 --> 152/74; checks every other week and is usually 130s/60s Meds: enalapril, HCTZ Taking meds: Yes : no issues    # of doses missed/week: 0 Symptoms: Headache: No Dizziness: No Vision changes: No SOB:  No Chest pain: No LE swelling: No Tobacco use: No Diet: not good   DIABETES Home CBGs: checks weekly, usually 120s-160s Meds: metformin Taking Meds: yes # of doses missed per week: 0 Hypoglycemic episodes?: no Last foot exam:  Last eye exam:  Symptoms: Polyuria: no Polydipsia: no Parasthesias: no   Dizziness: no  Nausea:  no Vomiting:  no Last A1c: 6.2 today   FATIGUE Has continued to be an issue.  Pt thinks it may have slightly worsened.  She didn't do anything but sleep the entire month of July.  She did not even do housework b/c she was so tired.  Still having decreased energy but now school has restarted.  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  Feels hot all of the time.  No skin, hair, or nail changes that she has noticed.  Feels like her mood is ok, just very tired.  Has also been steadily gaining weight despite not eating as much (not necessarily on purpose, just doesn't have an appetite and isn't hungry).  HYPERLIPIDEMIA Did not tolerate pravastatin b/c of muscle aches.  Open to trying other medications.  Tried taking it for a week and muscles aches did not get better.  Also took with Co-Q 10 since that is supposed to help, but it did not.  Has not been doing a good job with diet or exercise.  Is taking fish oil 2x/day to try to help.   GERD Per pt, stomach has settled and pain has gone away since taking omeprazole daily.  No further concerns with this, just letting me know my advice worked.   ROS: Per HPI  History  Smoking status  . Never Smoker   Smokeless tobacco  . Not on file    O:  Filed Vitals:   05/27/11 1000  BP: 152/74  Pulse:     Gen: NAD CV: RRR, no murmur Pulm: CTA bilat, no  wheezes or crackles Ext: Warm, no chronic skin changes, no edema   A/P: 55 y.o. female here for f/u -See problem list -f/u in 1-3 months

## 2011-05-27 NOTE — Assessment & Plan Note (Signed)
Has been ongoing issue but has gotten much worse over the past few months-- could not get out of bed in July b/c she was so tired and just wanted to sleep.  Has also had steady weight gain despite eating less.  No skin or hair changes but does feel like she stays hot all of the time.  Will check TSH, CMET, and CBC.

## 2011-05-27 NOTE — Assessment & Plan Note (Signed)
A1c 6.2, stable. Continue metformin. Encouraged pt to continue checking CBGs. No med changes needed at this time.

## 2011-05-27 NOTE — Assessment & Plan Note (Signed)
Symptoms much improved with daily PPI.  Will continue this. No changes necessary.

## 2011-05-27 NOTE — Assessment & Plan Note (Signed)
Will try crestor.  If still does not tolerate, will need to consider bile resin binders, but these are much more difficult to take.  Pt given coupon for FREE 30 day trial.  Pt is to call and inform me if she does or does not tolerate.  If she DOES tolerate, we have card so that she can get this medicine for $18/month.  Pt states she understands the importance of lowering her cholesterol.  LDL goal should be <70.  Gave pt information about diet.  Encouraged exercise.  Consider discussing nutrition referral w/ pt at her well woman exam.

## 2011-05-27 NOTE — Assessment & Plan Note (Signed)
Pt "knows" she needs to eat better and exercise.  Told pt we will discuss this further at well woman visit.

## 2011-05-27 NOTE — Patient Instructions (Addendum)
We're going to check a few labs today.  I'll send you a letter with the results unless there's something abnormal and then I would call. Your A1c was 6.2 today so you are doing very well with your diabetes. Your blood pressure was elevated.  Try to check it 2x/week and WRITE those numbers down so that we can see what it runs when you aren't here to see if we need to make any medicine changes.  We are going to try one more cholesterol medicine to see if we can work on getting that cholesterol down.  Keep taking the fish oil! You can take 2 grams per day.  Also, DIET and EXERCISE are key here!!! Come back and see me for your well woman exam in about 3 months.    Cholesterol Control Modern scientists have known for a long time that cholesterol levels in the body are related to coronary heart disease and that diet affects these levels. The following material helps to explain this relationship and discusses what you can do to help keep your heart healthy. Not all cholesterol is bad. Low-density lipoprotein (LDL) cholesterol is the "bad" cholesterol which may cause fatty deposits to build up inside your arteries. High-density lipoprotein (HDL) cholesterol is "good". It helps to remove the "bad" LDL cholesterol from your blood. Cholesterol is a very important risk factor for coronary heart disease. Other risk factors are high blood pressure, smoking, stress, heredity, and weight. The heart muscle gets its supply of blood through the coronary arteries. If your LDL ("bad") cholesterol is high and your HDL ("good") cholesterol is low, you have a risk factor for fatty deposits accumulating in your coronary arteries (a vessel providing blood to the heart). This leaves less room through which blood can flow. Without sufficient blood and the oxygen, the heart muscle cannot function properly, and you may feel chest pains (called angina pectoris). When a coronary artery closes up entirely, a part of the heart muscle may  die (called a myocardial infarction). CHECKING CHOLESTEROL When your caregiver sends your blood to a lab to be analyzed for cholesterol, they may do a complete lipid (fat) profile. With this test the total amount of cholesterol, as well as levels of LDL and HDL, are determined. The chart below describes what the numbers should be: Test Goal  Total Cholesterol Less than 200 mg/dl  LDL "bad cholesterol" Less than 160 mg/dl for those at low risk for heart disease Less than 130 mg/dl for those at intermediate risk for heart disease. Less than 100 mg/dl for those with diabetes or at high risk for heart disease Less than 70 mg/dl for those at very high risk for heart disease  HDL "good cholesterol" Women: Greater than 50 mg/dl Men: Greater than 40 mg/dl  Triglycerides Less than 150 mg/dl  Reference: [email or web address] 10/18/06 CONTROLLING CHOLESTEROL WITH DIET Although such factors as exercise and life-style are important, the "first line of attack" is diet. That is because certain foods are known to raise cholesterol and others to lower it. So the goal for most Americans is to balance foods for their effect on cholesterol and, even more important, to replace saturated fat with other types of fat, such as monounsaturated and polyunsaturated fats, and omega-3 fatty acids. The average American takes in about 500 to 600 milligrams (mg) of cholesterol and about 40 grams (g) of saturated fat daily. Ideally, these figures should be reduced to 300 mg of cholesterol and 15 to 17 g of saturated fat, or  even lower in people who have coronary artery disease or a history of heart attack. But that does not mean you have to sacrifice all your favorite foods. Today, as the table at the end of this document shows, there are good-tasting, low-fat, low-cholesterol substitutes for most of the things you like to eat. Which foods should you choose? Choose low-fat or non-fat alternatives. Choose round or loin cuts  of red meat as these types of cuts are lowest in fat and cholesterol. Chicken (without the skin), fish, veal and ground [place] breast are excellent choices. Eliminate fatty meats like hotdogs and salami. Even shellfish have little or no saturated fat and so, despite their high cholesterol content, are allowable in moderation. When you eat lean meat, poultry, or fish, have a 3 oz. portion. For baking and cooking, oils are an excellent substitute for butter. The monounsaturated oils are of particular benefit since it is believed they lower LDL (the bad cholesterol) but raise HDL. The oils you should avoid entirely are the saturated tropical oils such as coconut and palm.  Remember to eat liberally from food groups that are naturally free of cholesterol and saturated fat, including fish, fruit, vegetables, beans, grains (barley, rice, couscous, bul-gur wheat), and pasta (without cream sauces).  IDENTIFYING FOODS THAT LOWER CHOLESTEROL . . . Soluble fiber found in fruits such as apples; vegetables such as broccoli, potatoes, and carrots; legumes such as beans, peas, and lentils; and grains such as barley may lower your cholesterol. Foods fortified with plant sterols, or phytosterol, may also lower cholesterol. You should eat at least 2g per day of these foods for a cholesterol lowering effect.  How can you identify low-cholesterol, low-fat foods at the supermarket? The key is to read package labels. Select cheeses that have only 2-3 g saturated fat per ounce. Use a heart healthy tub margarine free of partially hydrogenated oil such as Insurance claims handler. When buying baked goods (cookies, crackers), avoid partially hydrogenated oils. Breads and muffins should be made from whole grains (whole wheat or whole oat flour, instead of just "flour" or "enriched flour"). Buy non-creamy canned soups with reduced salt and no added fats.  FOOD PREPARATION TECHNIQUES . . . Never deep fry. If you must fry, either stir  fry, which uses very little fat, or use the non-stick cooking sprays like Pam. When possible, broil, bake, or roast meats, and steam vegetables. Instead of dressing vegetables with butter or margarine, use lemon and herbs, applesauce and cinnamon (for squash and sweet potatoes), non-fat yogurt, salsa, and low-fat dressings for salads.  See the following table for food, cholesterol, and fat information. FOODS HIGH IN CHOLESTEROL AND/OR SATURATED FAT LOW CHOLESTEROL/LOW-FAT SUBSTITUTES   Cholesterol (milligrams) Saturated Fat (grams)  Cholesterol (milligrams) Saturated Fat (grams)  Steak, marbled (3 oz) 90 11 Steak, lean (3 oz) 50 4  Hamburger (3 oz) 80 7 Hamburger, lean (3 oz) 50 5  Ham (3 oz) 53 6 Ham, lean cut (3 oz) 35 2.4  Chicken, with skin  (3 oz)   Chicken, skin removed, 3 oz    Dark meat 90 4 Dark meat 80 2  Light meat 80 2.5 Light meat 70 1  Egg (1 Large) 200-300 1.7 Egg substitutes ("Eggbeaters") 0 0  Whole Milk (1 cup) 33 5 Low-fat milk (2%)  (1 cup) 18 3     Low-fat milk (1%)  (1 cup) 10 1.5     Skim milk (1 cup) trace 0.3  Hard Cheese (1 oz)  30 6 Skim milk cheese  (1 oz) 16 2-3  Cottage Cheese  (1 cup) (4% fat) 35 6.5 Low-fat cottage cheese (1 cup) (1% fat) 10 1.5  Ice cream (1 cup) 60 9 Sherbet (1 cup) 14 2.5     Non-fat frozen yogurt (1 cup) 0 0.3     Frozen fruit bars 0 trace  Whipped cream  (1 tbsp) 20 3.5 Non-dairy whipped toppings (1 tbsp) trace 1  Mayonnaise  (1 tbsp) 5 2 Low-fat mayonnaise (1 tbsp) 5 1  Butter (1 tbsp) 30 7 Extra light margarines ( tbsp) 0 1  Oils (1 tbsp)   Oils (1 tbsp)    Saturated   Monounsaturated    Coconut oil 0 11.8 Olive 0 1.8     Polyunsaturated       Corn 0 1.7     Safflower 0 1.2     Sunflower 0 1.4     Soybean 0 2.4  Document Released: 09/20/2005 Document Re-Released: 07/18/2007 [facility] Patient Information 2011 [place], [place].   Low-Cholesterol Guidelines WHAT IS CHOLESTEROL? Cholesterol is a waxy, fat-like substance  found only in animal fats. It is manufactured by the body and is essential to the functioning of the brain and many other organs. When eaten in excess, cholesterol can raise blood cholesterol levels or help form fatty deposits in blood vessel walls. WHAT IS SATURATED FAT? Saturated fats occur naturally in all foods of animal origin and in a few vegetable products such as coconut oil, palm or palm kernel oil, and cocoa butter (chocolate). Hydrogenated (hardened) fats such as vegetable shortening are also highly saturated. They are solid at room temperature and can also have the same harmful effects on your circulatory system as cholesterol. WHAT ARE UNSATURATED FATS? Unsaturated fats are fats of plant origin and are liquid at room temperature. Monounsaturated fats such as peanut oil, olive oil, and fish oils tend not to raise blood cholesterol levels and may even be beneficial in lowering them when used in place of saturated fats. Polyunsaturated fats such as safflower, sunflower, corn, soybean, sesame, and canola oils tend not to raise blood cholesterol when used in moderate amounts. HOW SHOULD I EAT?  Eat more fish and poultry (with no skin, visible fat, or breading). Eat less red meat.   Buy lean meat and trim all visible fat. Eat no more than 6 ounces (total cooked weight) of any meat, fish, or poultry each day.   Limit intake of fried or fatty foods.   Use low-fat dairy products such as skim milk, low- or non-fat yogurt, and low-fat cheese (try evaporated skim milk in your coffee instead of cream or nondairy creamers).   Eat five or more servings of fruits and vegetables each day. Keep some ready to eat in your refrigerator for a healthy snack.   Use heart healthy tub margarine or oils that are polyunsaturated rather than butter or vegetable shortening.   Avoid excessive use of foods known to be high in cholesterol, such as egg yolk, liver, shellfish, duck, and goose. Limit egg yolks to no  more than 4 per week.   Include complex carbohydrate foods such as whole-grain breads and cereals, beans, rice, and pasta. They contribute needed fiber and are very low in fat.  WHAT ELSE CAN I DO?  Remember to check all labels. Many foods contain hidden fats!   Bake, broil, or roast meat rather than frying them. Skip gravies and rich sauces.   Limit intake of rich pastries and  desserts that are high in butter, eggs, cream, or shortening.   Choose a frozen fruit sorbet or low- or non-fat frozen yogurt rather than ice cream. (Some sherbets may contain whole milk, so check labels carefully!)   Use a "nonstick" cooking spray or "nonstick" pans.   Chill and skim fat off of meat stock or drippings when making soup or sauces.   Check labels for the words "hydrogenated" or "hardened" fats or oils, and avoid these products.  ARE THERE OTHER TERMS YOU MAY HAVE QUESTIONS ABOUT? HDL's (high-density lipoproteins) and LDL's (low-density lipoproteins) transport cholesterol in your blood. LDLs carry cholesterol into your cells and HDLs carry it away and dispose of it in the liver, having a protective effect on your circulatory system. High saturated fat, high cholesterol diets may increase the harmful LDL's. Not smoking, participating in aerobic activity, and losing weight if you are overweight may increase the beneficial HDL's. Triglycerides (TG) are fatty compounds. Body fat is made up of mostly stored triglycerides. Triglycerides also circulate in the blood stream like cholesterol, and may have a link with heart disease. To lower triglycerides, maintain ideal body weight, limit intake of sugars and alcohol, and eat a low-fat diet. Document Released: 03/12/2002 Document Re-Released: 10/10/2007 [facility] Patient Information 2011 [place], [place].

## 2011-05-27 NOTE — Assessment & Plan Note (Addendum)
BP remains elevated: 152/74 on recheck.  Pt would like to hold on increasing medicines at this time since we are starting new lipid medicine.  Will possibly need to increase regimen at next visit.  Per pt, her BPs at home are usually 130s/60s, checks them every other week.  Asked pt to please check 2x/week and record these numbers and bring this to next appt so we can see what her BP usually runs.  Could likely increase HCTZ if needed to reach goal of <130/80 in obese pt w/ DM and HLD.  Checking CMET today for fatigue, so will know if Cr will allow increase in medication.  No red flags.

## 2011-05-28 ENCOUNTER — Encounter: Payer: Self-pay | Admitting: Family Medicine

## 2011-07-03 ENCOUNTER — Other Ambulatory Visit: Payer: Self-pay | Admitting: Family Medicine

## 2011-07-03 DIAGNOSIS — I10 Essential (primary) hypertension: Secondary | ICD-10-CM

## 2011-07-03 NOTE — Telephone Encounter (Signed)
Refill request

## 2011-07-03 NOTE — Telephone Encounter (Signed)
Will refill BP meds. Will not refill pain meds. Pt needs appt to discuss why she needs them.  She is not a chronic pain person and I have not Rx'ed them for her in over 6 months.

## 2011-07-09 LAB — CBC
MCHC: 32.9 g/dL (ref 30.0–36.0)
RBC: 4.51 MIL/uL (ref 3.87–5.11)
RDW: 14.8 % (ref 11.5–15.5)

## 2011-07-09 LAB — POCT CARDIAC MARKERS
CKMB, poc: 1.2 ng/mL (ref 1.0–8.0)
Myoglobin, poc: 55.7 ng/mL (ref 12–200)

## 2011-07-09 LAB — DIFFERENTIAL
Basophils Absolute: 0.1 10*3/uL (ref 0.0–0.1)
Basophils Relative: 2 % — ABNORMAL HIGH (ref 0–1)
Monocytes Relative: 4 % (ref 3–12)
Neutro Abs: 3.7 10*3/uL (ref 1.7–7.7)
Neutrophils Relative %: 49 % (ref 43–77)

## 2011-07-09 LAB — APTT: aPTT: 31 seconds (ref 24–37)

## 2011-07-09 LAB — BASIC METABOLIC PANEL
CO2: 26 mEq/L (ref 19–32)
Calcium: 8.9 mg/dL (ref 8.4–10.5)
Creatinine, Ser: 0.73 mg/dL (ref 0.4–1.2)
GFR calc Af Amer: >60 mL/min (ref >60)

## 2011-07-09 LAB — GLUCOSE, CAPILLARY
Glucose-Capillary: 115 mg/dL — ABNORMAL HIGH (ref 70–99)
Glucose-Capillary: 123 mg/dL — ABNORMAL HIGH (ref 70–99)
Glucose-Capillary: 141 mg/dL — ABNORMAL HIGH (ref 70–99)

## 2011-07-09 LAB — CARDIAC PANEL(CRET KIN+CKTOT+MB+TROPI)
CK, MB: 1.1 ng/mL (ref 0.3–4.0)
CK, MB: 1.1 ng/mL (ref 0.3–4.0)
Relative Index: INVALID (ref 0.0–2.5)
Total CK: 111 U/L (ref 7–177)
Troponin I: <0.01 ng/mL (ref 0.00–0.06)
Troponin I: <0.01 ng/mL (ref 0.00–0.06)

## 2011-07-09 LAB — CROSSMATCH: ABO/RH(D): A POS

## 2011-07-09 LAB — LIPID PANEL
Cholesterol: 245 mg/dL — ABNORMAL HIGH (ref 0–200)
LDL Cholesterol: 135 mg/dL — ABNORMAL HIGH (ref 0–99)

## 2011-07-09 LAB — PROTIME-INR
INR: 0.9 (ref 0.00–1.49)
Prothrombin Time: 12.2 seconds (ref 11.6–15.2)

## 2011-07-09 LAB — TROPONIN I
Troponin I: 0.01 ng/mL (ref 0.00–0.06)
Troponin I: 0.01 ng/mL (ref 0.00–0.06)

## 2011-07-09 LAB — TRIGLYCERIDES: Triglycerides: 194 mg/dL — ABNORMAL HIGH (ref <150)

## 2011-08-03 ENCOUNTER — Other Ambulatory Visit: Payer: Self-pay | Admitting: Family Medicine

## 2011-08-03 NOTE — Telephone Encounter (Signed)
Refill request

## 2011-08-06 ENCOUNTER — Other Ambulatory Visit: Payer: Self-pay | Admitting: Family Medicine

## 2011-08-06 NOTE — Telephone Encounter (Signed)
Refill request

## 2011-08-16 ENCOUNTER — Ambulatory Visit (INDEPENDENT_AMBULATORY_CARE_PROVIDER_SITE_OTHER): Payer: Self-pay | Admitting: Family Medicine

## 2011-08-16 ENCOUNTER — Encounter: Payer: Self-pay | Admitting: Family Medicine

## 2011-08-16 VITALS — BP 162/84 | HR 78 | Temp 98.4°F | Ht 64.0 in | Wt 260.0 lb

## 2011-08-16 DIAGNOSIS — E7800 Pure hypercholesterolemia, unspecified: Secondary | ICD-10-CM

## 2011-08-16 DIAGNOSIS — L293 Anogenital pruritus, unspecified: Secondary | ICD-10-CM

## 2011-08-16 DIAGNOSIS — E119 Type 2 diabetes mellitus without complications: Secondary | ICD-10-CM

## 2011-08-16 DIAGNOSIS — M79673 Pain in unspecified foot: Secondary | ICD-10-CM

## 2011-08-16 DIAGNOSIS — N898 Other specified noninflammatory disorders of vagina: Secondary | ICD-10-CM

## 2011-08-16 DIAGNOSIS — Z23 Encounter for immunization: Secondary | ICD-10-CM

## 2011-08-16 DIAGNOSIS — I10 Essential (primary) hypertension: Secondary | ICD-10-CM

## 2011-08-16 DIAGNOSIS — E669 Obesity, unspecified: Secondary | ICD-10-CM

## 2011-08-16 DIAGNOSIS — Z124 Encounter for screening for malignant neoplasm of cervix: Secondary | ICD-10-CM

## 2011-08-16 DIAGNOSIS — Z01419 Encounter for gynecological examination (general) (routine) without abnormal findings: Secondary | ICD-10-CM

## 2011-08-16 DIAGNOSIS — Z0000 Encounter for general adult medical examination without abnormal findings: Secondary | ICD-10-CM

## 2011-08-16 DIAGNOSIS — M79609 Pain in unspecified limb: Secondary | ICD-10-CM

## 2011-08-16 DIAGNOSIS — M79604 Pain in right leg: Secondary | ICD-10-CM

## 2011-08-16 DIAGNOSIS — M79605 Pain in left leg: Secondary | ICD-10-CM

## 2011-08-16 LAB — POCT GLYCOSYLATED HEMOGLOBIN (HGB A1C): Hemoglobin A1C: 6.5

## 2011-08-16 LAB — POCT WET PREP (WET MOUNT): Clue Cells Wet Prep HPF POC: NEGATIVE

## 2011-08-16 LAB — BASIC METABOLIC PANEL
CO2: 24 mEq/L (ref 19–32)
Calcium: 9.4 mg/dL (ref 8.4–10.5)
Creat: 0.7 mg/dL (ref 0.50–1.10)
Sodium: 141 mEq/L (ref 135–145)

## 2011-08-16 LAB — LDL CHOLESTEROL, DIRECT: Direct LDL: 167 mg/dL — ABNORMAL HIGH

## 2011-08-16 MED ORDER — METOPROLOL TARTRATE 25 MG PO TABS: 25.0000 mg | ORAL_TABLET | Freq: Two times a day (BID) | ORAL | Status: DC

## 2011-08-16 MED ORDER — ROSUVASTATIN CALCIUM 10 MG PO TABS: 10.0000 mg | ORAL_TABLET | Freq: Every day | ORAL | Status: DC

## 2011-08-16 MED ORDER — FLUCONAZOLE 150 MG PO TABS: 150.0000 mg | ORAL_TABLET | Freq: Once | ORAL | Status: AC

## 2011-08-16 MED ORDER — GABAPENTIN 300 MG PO CAPS: 300.0000 mg | ORAL_CAPSULE | Freq: Two times a day (BID) | ORAL | Status: DC

## 2011-08-16 NOTE — Patient Instructions (Addendum)
It was a nice to see you today!  We did your Pap smear and check some labs. You can expect a letter from me with the results in the next few weeks.  For your vaginal itching, I think that it may be a yeast infection. I am going to give you a by mouth medicine. You'll take one pill today. If you're still having symptoms in 72 hours, you can take a second pill. If after a week, you do not think that the redness and itching have gotten any better, please call and I will call in a different medicine since it could be something different called lichen sclerosis. However, I think that a yeast infection is much more likely.  Please make sure you get a colonoscopy! This is very important!  You should also go to an eye doctor to have your eyes checked since you have diabetes. It is very important that you do this every year!  Your blood pressure was elevated today. Please make sure you're taking your medicines every single day. It's very important. I am also adding a new medicine called metoprolol.  For diabetes, when I send you your letter with your lab results, I will say whether or not I feel like we can decrease her metformin to once a day.  For your cholesterol, it is very important that you are taking both the Crestor everyday as well as the fish oil. I provided you with a savings card for the Crestor today.  You have lost a couple of pounds since your last visit, which is great! However, we still have a long way to go. Your BMI is 44. It is very important for your health that you try to eat a healthy diet and exercise at least 30 minutes 3-5 days a week. If you would like help with this, I can refer you to our nutritionist [facility].  For your leg pain, I'm going to start a medicine called Neurontin. This should hopefully help with the achiness. If it makes her too sleepy, you can take it just before bedtime once a day. Otherwise, you can take it twice a day. If you feel like  it is helping but not quite enough, you can increase it to 3 times a day.  For the foot pain, please go to [facility] to have an x-ray done of your foot at some point so that we can see if there is a foreign body stuck in the bottom of your foot.  Please come back and see me in 3 months.     Obesity Obesity is defined as having a body mass index (BMI) of 30 or more. To calculate your BMI divide your weight in pounds by your height in inches squared and multiply that product by 703. Major illnesses resulting from long-term obesity include:  Stroke.   Heart disease.   Diabetes.   Many cancers.   Arthritis.  Obesity also complicates recovery from many other medical problems.  CAUSES   A history of obesity in your parents.   Thyroid hormone imbalance.   Environmental factors such as excess calorie intake and physical inactivity.  TREATMENT  A healthy weight loss program includes:  A calorie restricted diet based on individual calorie needs.   Increased physical activity (exercise).  An exercise program is just as important as the right low-calorie diet.  Weight-loss medicines should be used only under the supervision of your physician. These medicines help, but  only if they are used with diet and exercise programs. Medicines can have side effects including nervousness, nausea, abdominal pain, diarrhea, headache, drowsiness, and depression.  An unhealthy weight loss program includes:  Fasting.   Fad diets.   Supplements and drugs.  These choices do not succeed in long-term weight control.  HOME CARE INSTRUCTIONS  To help you make the needed dietary changes:   Exercise and perform physical activity as directed by your caregiver.   Keep a daily record of everything you eat. There are many free websites to help you with this. It may be helpful to measure your foods so you can determine if you are eating the correct portion sizes.   Use low-calorie cookbooks or  take special cooking classes.   Avoid alcohol. Drink more water and drinks with no calories.   Take vitamins and supplements only as recommended by your caregiver.   Weight loss support groups, Registered Dieticians, counselors, and stress reduction education can also be very helpful.  Document Released: 10/28/2004 Document Revised: 06/02/2011 Document Reviewed: 08/27/2007 [facility] Patient Information 2012 [place], [place].   Calorie Counting Diet A calorie counting diet requires you to eat the number of calories that are right for you in a day. Calories are the measurement of how much energy you get from the food you eat. Eating the right amount of calories is important for staying at a healthy weight. If you eat too many calories, your body will store them as fat and you may gain weight. If you eat too few calories, you may lose weight. Counting the number of calories you eat during a day will help you know if you are eating the right amount. A Registered Dietitian can determine how many calories you need in a day. The amount of calories needed varies from person to person. If your goal is to lose weight, you will need to eat fewer calories. Losing weight can benefit you if you are overweight or have health problems such as heart disease, high blood pressure, or diabetes. If your goal is to gain weight, you will need to eat more calories. Gaining weight may be necessary if you have a certain health problem that causes your body to need more energy. TIPS Whether you are increasing or decreasing the number of calories you eat during a day, it may be hard to get used to changes in what you eat and drink. The following are tips to help you keep track of the number of calories you eat.  Measure foods at home with measuring cups. This helps you know the amount of food and number of calories you are eating.   Restaurants often serve food in amounts that are larger than 1 serving. While eating out,  estimate how many servings of a food you are given. For example, a serving of cooked rice is  cup or about the size of half of a fist. Knowing serving sizes will help you be aware of how much food you are eating at restaurants.   Ask for smaller portion sizes or child-size portions at restaurants.   Plan to eat half of a meal at a restaurant. Take the rest home or share the other half with a friend.   Read the Nutrition Facts panel on food labels for calorie content and serving size. You can find out how many servings are in a package, the size of a serving, and the number of calories each serving has.   For example, a package might  contain 3 cookies. The Nutrition Facts panel on that package says that 1 serving is 1 cookie. Below that, it will say there are 3 servings in the container. The calories section of the Nutrition Facts label says there are 90 calories. This means there are 90 calories in 1 cookie (1 serving). If you eat 1 cookie you have eaten 90 calories. If you eat all 3 cookies, you have eaten 270 calories (3 servings x 90 calories = 270 calories).  The list below tells you how big or small some common portion sizes are.  1 oz.........4 stacked dice.   3 oz........Marland KitchenDeck of cards.   1 tsp.......Marland KitchenTip of little finger.   1 tbs......Marland KitchenMarland KitchenThumb.   2 tbs.......Marland KitchenGolf ball.    cup......Marland KitchenHalf of a fist.   1 cup.......Marland KitchenA fist.  KEEP A FOOD LOG Write down every food item you eat, the amount you eat, and the number of calories in each food you eat during the day. At the end of the day, you can add up the total number of calories you have eaten. It may help to keep a list like the one below. Find out the calorie information by reading the Nutrition Facts panel on food labels. Breakfast  Bran cereal (1 cup, 110 calories).   Fat-free milk ( cup, 45 calories).  Snack  Apple (1 medium, 80 calories).  Lunch  Spinach (1 cup, 20 calories).   Tomato ( medium, 20 calories).   Chicken  breast strips (3 oz, 165 calories).   Shredded cheddar cheese ( cup, 110 calories).   Light [place] dressing (2 tbs, 60 calories).   Whole-wheat bread (1 slice, 80 calories).   Tub margarine (1 tsp, 35 calories).   Vegetable soup (1 cup, 160 calories).  Dinner  Pork chop (3 oz, 190 calories).   Brown rice (1 cup, 215 calories).   Steamed broccoli ( cup, 20 calories).   Strawberries (1  cup, 65 calories).   Whipped cream (1 tbs, 50 calories).  Daily Calorie Total: 1425 Document Released: 09/20/2005 Document Revised: 06/02/2011 Document Reviewed: 03/17/2007 [facility] Patient Information 2012 [place], [place].   Exercise to Lose Weight Exercise and a healthy diet may help you lose weight. Your doctor may suggest specific exercises. EXERCISE IDEAS AND TIPS  Choose low-cost things you enjoy doing, such as walking, bicycling, or exercising to workout videos.   Take stairs instead of the elevator.   Walk during your lunch break.   Park your car further away from work or school.   Go to a gym or an exercise class.   Start with 5 to 10 minutes of exercise each day. Build up to 30 minutes of exercise 4 to 6 days a week.   Wear shoes with good support and comfortable clothes.   Stretch before and after working out.   Work out until you breathe harder and your heart beats faster.   Drink extra water when you exercise.   Do not do so much that you hurt yourself, feel dizzy, or get very short of breath.  Exercises that burn about 150 calories:  Running 1  miles in 15 minutes.   Playing volleyball for 45 to 60 minutes.   Washing and waxing a car for 45 to 60 minutes.   Playing touch football for 45 minutes.   Walking 1  miles in 35 minutes.   Pushing a stroller 1  miles in 30 minutes.   Playing basketball for 30 minutes.   Raking leaves for 30 minutes.  Bicycling 5 miles in 30 minutes.   Walking 2 miles in 30 minutes.   Dancing for 30 minutes.    Shoveling snow for 15 minutes.   Swimming laps for 20 minutes.   Walking up stairs for 15 minutes.   Bicycling 4 miles in 15 minutes.   Gardening for 30 to 45 minutes.   Jumping rope for 15 minutes.   Washing windows or floors for 45 to 60 minutes.  Document Released: 10/23/2010 Document Revised: 06/02/2011 Document Reviewed: 10/23/2010 [facility] Patient Information 2012 [place], [place].

## 2011-08-16 NOTE — Progress Notes (Signed)
S: Pt comes in today for well woman exam.  HYPERTENSION BP: 162/84; average 140-150s/70-110s at home Meds: Enalapril, HCTZ Taking meds: Yes     # of doses missed/week: 0 Symptoms: Headache: No Dizziness: No Vision changes: No SOB:  Yes : with exertion  Chest pain: No LE swelling: Yes : Swelling in ankles all the time Tobacco use: No Diet: Trying to take things more and eat [place]. Try and eat salad at least 2-3 times per week. Eating wheat bread instead of white.   DIABETES Home CBGs: Does not check, nose when sugars are high because things taste sweet; only checks her sugars when she thinks there is a problem. Had one reading of 300 approximately 3 weeks ago, which was the only time in the past few months that she felt like her sugar was fine. Meds: Metformin 500 twice a day Taking Meds: yes, causes some nausea # of doses missed per week: 0 Hypoglycemic episodes?: no Last foot exam:  Last eye exam:  Symptoms: Polyuria: no Polydipsia: no Parasthesias: no   Dizziness: no  Nausea:  yes, from medicine  Vomiting:  no Last A1c:  Lab Results  Component Value Date   HGBA1C 6.5 08/16/2011      HYPERLIPIDEMIA Tolerating Crestor well but has been out for approximately 1-2 months. Has been unable to take omega-3 fish oil secondary to nausea from metformin. 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  WELL WOMAN Other than Alista complaints, patient is doing well. She's been trying to eat a better diet. She walks for approximately 15 minutes 3 times a week around campus when she has school. She knows that her high blood pressure and weight need to be better controlled. She would like her Pap smear and flu shot today.  OBESITY Patient has lost 2 pounds in the past 3 months. She states that her children have moved home and she initially was cooking the way that they like things cooked. This included increased processed foods.  However, patient realized that she needed to eat the way she wanted to eat. This means that she's not been any fried foods. She's been trying to be things more and has decreased the amount that she is eating out. Chest make a large salad at least 2 times a week that she can eat periodically. She has a has been tiny eat wheat bread and set up white. However, she knows that she should probably not be eating as much bread she is eating. She is able to walk when she is on campus for school. This means that 3 times a week she walks for up to 15 minutes. She's not doing any other form of exercise.  Wt Readings from Last 3 Encounters:  08/16/11 260 lb (117.935 kg)  05/27/11 262 lb (118.842 kg)  01/25/11 255 lb 1.6 oz (115.713 kg)     VAGINAL ITCHING/DISCHARGE Patient reports a thin, clear vaginal discharge. She states that it is all the time. She also reports occasional pink on tissue paper with wiping but has not had any periods for months. She also endorses some vaginal itching. She think she has noticed a rash. All of this has been going on for weeks to months. For her vaginal discharge, she tried Summer's Eve Medicated Douche and Vagisil. Neither of these seem to help the discharge.  KNEE/LEG PAIN Patient reports that her legs hurt some days, not everyday. On the  data they do her, however, she can barely walk. She states that it is mostly in her knees. She states the pain in her knees as a sharp pain. She also has pains from her hips down which are dull in nature. She states that the achiness is sometimes worse in the morning. However, pain is always worse after standing for long periods of time. It is especially worse in her knees after standing. She has not noticed any numbness, tingling, or weakness. She does have swelling in her ankles all the time. She does not think she has decreased sensation in her feet or legs.  FOOT PAIN The pain is on her heel. The pain started a few months ago but then  resolved spontaneously a few weeks ago. She states that it feels like she is stepping on something every time she takes a step. It is not better or worse in the morning. The pain is always present when stepping on her right heel. Pain spontaneously resolved for a few weeks but restarted again yesterday. Patient is unsure what caused it. She does not think she had any trauma to that foot. She does note a bruise on the bottom of her heel. She has not have any other wounds or ulcers on her feet.  ROS: Per HPI  History  Smoking status  . Former Smoker  Smokeless tobacco  . Not on file    O:  Filed Vitals:   08/16/11 0949  BP: 162/84  Pulse: 78  Temp: 98.4 F (36.9 C)    Gen: NAD CV: RRR, no murmur, no carotid bruit Pulm: CTA bilat, no wheezes or crackles [id number],  soft, NT Ext: Warm, no chronic skin changes, no edema. Small 3-61mm bruised area over middle of right heel, + TTP GU: cervix nml, w/o bleeding or exudate, vagina nml; beefy red skin intertrigo of thighs and groin    A/P: 55 y.o. female p/w well woman visit -See problem list -f/u in 3 months

## 2011-08-17 NOTE — Assessment & Plan Note (Signed)
Encouraged increase exercise and continue diet. Patient has lost 2 pounds. We'll continue to follow  up at future visits.

## 2011-08-17 NOTE — Assessment & Plan Note (Signed)
Patient continues to have nausea with metformin. Could consider decreasing metformin to 500 once a day if A1c continues to be at goal.

## 2011-08-17 NOTE — Assessment & Plan Note (Signed)
Area between thighs appears most consistent with yeast infection however cannot rule out lichen sclerosus. Will treat with fluids, salt. If no improvement, patient is to call we will start topical steroid, clobetazol 0.05% BID x1 month, daily x1 month, QOD x1 month.  Also in the differential is erythrasma, which would be treated with antibiotics. Will have patient followup in 3 months for recheck.

## 2011-08-17 NOTE — Assessment & Plan Note (Signed)
Unclear etiology. Could be foreign body. Will send for x-ray of heel.

## 2011-08-17 NOTE — Assessment & Plan Note (Signed)
Encouraged patient to restart Crestor. [facility] card provided. Also encouraged patient to restart fish oil. LDL checked today.

## 2011-08-17 NOTE — Assessment & Plan Note (Signed)
Wet prep negative for clue cells yeast and trichomonas. We'll continue to follow. Encourage patient to decrease the amount of douching.

## 2011-08-17 NOTE — Assessment & Plan Note (Signed)
Some somewhat consistent with neuropathy. Will start Neurontin and see if this helps with pain.

## 2011-08-17 NOTE — Assessment & Plan Note (Addendum)
Blood pressure remains elevated. Metoprolol added.

## 2011-08-17 NOTE — Assessment & Plan Note (Signed)
Pap smear done, last checked. Encourage patient to get colonoscopy. Mammogram up to date. Next well woman exam in one year.

## 2011-08-19 ENCOUNTER — Encounter: Payer: Self-pay | Admitting: Family Medicine

## 2011-08-20 ENCOUNTER — Telehealth: Payer: Self-pay | Admitting: *Deleted

## 2011-08-20 ENCOUNTER — Telehealth: Payer: Self-pay | Admitting: Family Medicine

## 2011-08-20 NOTE — Telephone Encounter (Signed)
PA required for Crestor. Form placed in MD box. 

## 2011-08-20 NOTE — Telephone Encounter (Signed)
Was given Gabapentin 300mg  and is concerned that it is for seizures and she is having neuropathy pain in legs.  Wants to make sure it's the correct medicine. She is getting ready to do errands and wants the nurse to leave message on VM

## 2011-08-20 NOTE — Telephone Encounter (Signed)
Patient notified that rx is for neuropathy. She voices understanding.

## 2011-08-23 NOTE — Telephone Encounter (Signed)
PA filled out-- reason for crestor is intolerance of multiple other statins, including the 2 $4 ones and financial hardship plus multiple risk factors for CAD inc. DM, HTN, obesity in a pt with uncontrolled HLD.

## 2011-09-30 ENCOUNTER — Encounter: Payer: Self-pay | Admitting: Family Medicine

## 2011-09-30 ENCOUNTER — Ambulatory Visit (INDEPENDENT_AMBULATORY_CARE_PROVIDER_SITE_OTHER): Payer: Self-pay | Admitting: Family Medicine

## 2011-09-30 ENCOUNTER — Ambulatory Visit (HOSPITAL_COMMUNITY)
Admission: RE | Admit: 2011-09-30 | Discharge: 2011-09-30 | Disposition: A | Discharge status: Home / Self Care | Payer: Self-pay | Source: Ambulatory Visit | Attending: Family Medicine | Admitting: Family Medicine

## 2011-09-30 VITALS — BP 138/77 | HR 68 | Temp 98.4°F | Wt 271.0 lb

## 2011-09-30 DIAGNOSIS — M1710 Unilateral primary osteoarthritis, unspecified knee: Secondary | ICD-10-CM | POA: Insufficient documentation

## 2011-09-30 DIAGNOSIS — IMO0002 Reserved for concepts with insufficient information to code with codable children: Secondary | ICD-10-CM | POA: Insufficient documentation

## 2011-09-30 DIAGNOSIS — M25569 Pain in unspecified knee: Secondary | ICD-10-CM

## 2011-09-30 DIAGNOSIS — M25562 Pain in left knee: Secondary | ICD-10-CM

## 2011-09-30 NOTE — Patient Instructions (Signed)
Arthritis, Nonspecific  Arthritis is inflammation of a joint. This usually means pain, redness, warmth or swelling are present. One or more joints may be involved. There are a number of types of arthritis. Your caregiver may not be able to tell what type of arthritis you have right away.  CAUSES   The most common cause of arthritis is the wear and tear on the joint (osteoarthritis). This causes damage to the cartilage, which can break down over time. The knees, hips, back and neck are most often affected by this type of arthritis.  Other types of arthritis and common causes of joint pain include:  · Sprains and other injuries near the joint. Sometimes minor sprains and injuries cause pain and swelling that develop hours later.   · Rheumatoid arthritis. This affects hands, feet and knees. It usually affects both sides of your body at the same time. It is often associated with chronic ailments, fever, weight loss and general weakness.   · Crystal arthritis. Gout and pseudo gout can cause occasional acute severe pain, redness and swelling in the foot, ankle, or knee.   · Infectious arthritis. Bacteria can get into a joint through a break in overlying skin. This can cause infection of the joint. Bacteria and viruses can also spread through the blood and affect your joints.   · Drug, infectious and allergy reactions. Sometimes joints can become mildly painful and slightly swollen with these types of illnesses.   SYMPTOMS   · Pain is the main symptom.   · Your joint or joints can also be red, swollen and warm or hot to the touch.   · You may have a fever with certain types of arthritis, or even feel overall ill.   · The joint with arthritis will hurt with movement. Stiffness is present with some types of arthritis.   DIAGNOSIS   Your caregiver will suspect arthritis based on your description of your symptoms and on your exam. Testing may be needed to find the type of arthritis:  · Blood and sometimes urine tests.    · X-ray tests and sometimes CT or MRI scans.   · Removal of fluid from the joint (arthrocentesis) is done to check for bacteria, crystals or other causes. Your caregiver (or a specialist) will numb the area over the joint with a local anesthetic, and use a needle to remove joint fluid for examination. This procedure is only minimally uncomfortable.   · Even with these tests, your caregiver may not be able to tell what kind of arthritis you have. Consultation with a specialist (rheumatologist) may be helpful.   TREATMENT   Your caregiver will discuss with you treatment specific to your type of arthritis. If the specific type cannot be determined, then the following general recommendations may apply.  Treatment of severe joint pain includes:  · Rest.   · Elevation.   · Anti-inflammatory medication (for example, ibuprofen) may be prescribed. Avoiding activities that cause increased pain.   · Only take over-the-counter or prescription medicines for pain and discomfort as recommended by your caregiver.   · Cold packs over an inflamed joint may be used for 10 to 15 minutes every hour. Hot packs sometimes feel better, but do not use overnight. Do not use hot packs if you are diabetic without your caregiver's permission.   · A cortisone shot into arthritic joints may help reduce pain and swelling.   · Any acute arthritis that gets worse over the next 1 to 2   days needs to be looked at to be sure there is no joint infection.   Long-term arthritis treatment involves modifying activities and lifestyle to reduce joint stress jarring. This can include weight loss. Also, exercise is needed to nourish the joint cartilage and remove waste. This helps keep the muscles around the joint strong.  HOME CARE INSTRUCTIONS   · Do not take aspirin to relieve pain if gout is suspected. This elevates uric acid levels.   · Only take over-the-counter or prescription medicines for pain, discomfort or fever as directed by your caregiver.    · Rest the joint as much as possible.   · If your joint is swollen, keep it elevated.   · Use crutches if the painful joint is in your leg.   · Drinking plenty of fluids may help for certain types of arthritis.   · Follow your caregiver's dietary instructions.   · Try low-impact exercise such as:   · Swimming.   · Water aerobics.   · Biking.   · Walking.   · Morning stiffness is often relieved by a warm shower.   · Put your joints through regular range-of-motion.   SEEK MEDICAL CARE IF:   · You do not feel better in 24 hours or are getting worse.   · You have side effects to medications, or are not getting better with treatment.   SEEK IMMEDIATE MEDICAL CARE IF:   · You have a fever.   · You develop severe joint pain, swelling or redness.   · Many joints are involved and become painful and swollen.   · There is severe back pain and/or leg weakness.   · You have loss of bowel or bladder control.   Document Released: 10/28/2004 Document Revised: 06/02/2011 Document Reviewed: 11/13/2008  [employer]® Patient Information ©2012 [employer].

## 2011-09-30 NOTE — Progress Notes (Signed)
  Subjective:    Patient ID: [name], female    DOB: 1956-06-09, 55 y.o.   MRN: [id number]  HPI Patient presents today with left knee pain x4 days. Patient states she has had ongoing knee pain for at least the last several months. Patient states she noticed significant left knee pain starting Monday morning. Patient also noticed left knee swelling. Patient denies any referred pain to the hip or ankle. Patient is currently ambulating intermittently with a cane that she borrowed from her daughter. Patient denies any redness, fever. No history of DVT. Patient is been otherwise ambulating. No recent prolonged travel. Patient is a nonsmoker. Patient is on OCPs. Patient states she's been using naproxen as well as Tylenol for pain with minimal improvement. Pain seems to be worse in the morning with early ambulation improves throughout the day.   Review of Systems See HPI, otherwise ROS negative.     Objective:   Physical Exam Gen: in bed, NAD, morbidly obese  MSK: + TTP along medial L knee joint line, no popliteal tenderness, mild calf tenderness. Calves symmetric. + McMurray's.    Assessment & Plan:

## 2011-10-05 DIAGNOSIS — M25562 Pain in left knee: Secondary | ICD-10-CM | POA: Insufficient documentation

## 2011-10-05 NOTE — Assessment & Plan Note (Addendum)
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 Will obtain bilateral knee standing xrays as well as weightbearing films of L knee. Wells score 0. No signs of DVT. Therapeutic knee injection performed at bedside. Handout given.   After consent was obtained, using sterile technique the L knee was prepped and area locally anesthetized with ethyl chloride.  The needle was placed via the lateral parapatellar approach. 60mg  of kenalog and 4 ccs plain Lidocaine was injected into the  knee joint space. The procedure was well tolerated.    Pt instructed to follow up with PCP in 2-4 weeks.

## 2011-10-07 ENCOUNTER — Encounter: Payer: Self-pay | Admitting: Family Medicine

## 2011-10-21 ENCOUNTER — Encounter: Payer: Self-pay | Admitting: Family Medicine

## 2011-10-21 ENCOUNTER — Ambulatory Visit (INDEPENDENT_AMBULATORY_CARE_PROVIDER_SITE_OTHER): Payer: Self-pay | Admitting: Family Medicine

## 2011-10-21 DIAGNOSIS — M25562 Pain in left knee: Secondary | ICD-10-CM

## 2011-10-21 DIAGNOSIS — M79604 Pain in right leg: Secondary | ICD-10-CM

## 2011-10-21 DIAGNOSIS — E119 Type 2 diabetes mellitus without complications: Secondary | ICD-10-CM

## 2011-10-21 DIAGNOSIS — I10 Essential (primary) hypertension: Secondary | ICD-10-CM

## 2011-10-21 DIAGNOSIS — M79605 Pain in left leg: Secondary | ICD-10-CM

## 2011-10-21 DIAGNOSIS — M79609 Pain in unspecified limb: Secondary | ICD-10-CM

## 2011-10-21 DIAGNOSIS — M25569 Pain in unspecified knee: Secondary | ICD-10-CM

## 2011-10-21 DIAGNOSIS — E7800 Pure hypercholesterolemia, unspecified: Secondary | ICD-10-CM

## 2011-10-21 DIAGNOSIS — E669 Obesity, unspecified: Secondary | ICD-10-CM

## 2011-10-21 MED ORDER — MELOXICAM 7.5 MG PO TABS: 7.5000 mg | ORAL_TABLET | Freq: Every day | ORAL | Status: DC

## 2011-10-21 NOTE — Patient Instructions (Signed)
It was great to see you! Keep up with the diet and try to start back with exercising.  Even though it hurts to do it, it's what is ultimately going to help your knee pain! STOP taking your Naproxen once you are able to pick up the new medicine, Meloxicam.  Try taking 1 a day at first, but you can take 2 a day if you need to.  Don't take more than 2 in a day! If you would like, we can try a topical medicine as well if the Meloxicam doesn't help I'm glad that the neurontin/gabapentin is helping your leg and hand pain! Your blood pressure looked fine on the recheck.  Keep taking all of your medicines. Come back in about 3 months for your blood pressure and diabetes.    Get your eyes checked!!!! PLEASE consider getting your coloscopy.

## 2011-10-21 NOTE — Progress Notes (Signed)
S: Pt comes in today for follow up.  HYPERTENSION BP: 174/82 --> 125/65 on recheck, has been running 120-130 systolic at home Meds: Enalapril 40, HCTZ 25, metoprolol 25 BID Taking meds: Yes but missed yesterday    # of doses missed/week: 2 Symptoms: Headache: No Dizziness: No Vision changes: No SOB:  No Chest pain: No LE swelling: No Tobacco use: No Diet: trying to watch salt intake  DIABETES Home CBGs: usually low 100s, has had 1-2 >200 in the past few weeks Meds: neurontin 300 TID, metformin 500 BID Taking Meds: yes # of doses missed per week: 1 Hypoglycemic episodes?: no Last foot exam: WILL DO AT NEXT VISIT Last eye exam: >1 year ago, pt aware she needs to have this done Symptoms: Polyuria: no Polydipsia: no Parasthesias: yes   Dizziness: no  Nausea:  no Vomiting:  no Last A1c:  Lab Results  Component Value Date   HGBA1C 6.5 08/16/2011  - Patient reports that neurontin has significantly helped with paraesthesias in her hands and toes.  Increased from BID to TID and is tolerating well.    LEFT KNEE PAIN Had injection done by Dr. Alvester Morin 2-3 weeks ago, which she thinks helped a little.  Naproxen is helping a little, increased her dose from2 to 3 tab BID (now taking 750mg  BID which is max dose).   Has not had any vicodin since last visit since we deiced not to treat her arthritis with a narcotic.  Aware that weight loss is the key and admits that with her recent 10# weight gain over the holidays, her knee pain has gotten much worse.  Has not noticed any redness or warmth, no fevers or chills.  Sometimes has some swelling of her left knee and lower leg at the end of the day but this resolves with rest and is not associated with any calf pain.    OBESITY Patient with almost 10# weight gain over holidays, now losing weight again.  Has been trying to eat more healthily and knows that weight loss is important for all of her medical problems.  Wants to restart her fit and lean program,  which is a book she has done previously.  She has been unable to use her Sarina Ser because of her knee pain; was using is 2x/week.    ROS: Per HPI  History  Smoking status  . Former Smoker  Smokeless tobacco  . Never Used    O:  Filed Vitals:   10/21/11 1035  BP: 174/82  Pulse: 60  Temp: 98 F (36.7 C)    Gen: NAD CV: RRR, no murmur Pulm: CTA bilat, no wheezes or crackles Ext: Warm, no chronic skin changes, no edema, no redness or swelling of left knee   A/P: 56 y.o. female p/w HTN, DM, knee pain -See problem list -f/u in 3 months

## 2011-10-22 NOTE — Assessment & Plan Note (Signed)
Improved on neurontin, pt self increased to TID dosing and is tolerating well.

## 2011-10-22 NOTE — Assessment & Plan Note (Signed)
S/p injection 10/05/11 with some relief.  Will try meloxicam in place of naproxen to see if pt responds better to this.  Weight loss is key.

## 2011-10-22 NOTE — Assessment & Plan Note (Signed)
Well controlled on recheck and per pt report is well controlled at home.  Continue current regimen with enalapril, HCTZ, metoprolol.

## 2011-10-22 NOTE — Assessment & Plan Note (Signed)
FLP due 4/13, direct LDL was significantly elevated.  Patient on fish oil and crestor.

## 2011-10-22 NOTE — Assessment & Plan Note (Signed)
Patient with recent weight gain, now starting to lose.  Encouraged diet.  Exercise is difficult because of knee pain but pt aware that weight loss is the best treatment for this as well.  Pt wants to restart her home fit and lean program.  Wt Readings from Last 3 Encounters:  10/21/11 265 lb (120.203 kg)  09/30/11 271 lb (122.925 kg)  08/16/11 260 lb (117.935 kg)

## 2011-10-22 NOTE — Assessment & Plan Note (Signed)
Seems to be tolerating BID metformin well.  Significant improvement with neuropathy with neurontin, will continue 300mg  TID.  Next A1c due in 1 month, patient is to follow up within the next 3 months for recheck.  Will do foot exam at that visit.  Patient is also to go and have eye exam done.

## 2011-11-10 ENCOUNTER — Other Ambulatory Visit: Payer: Self-pay | Admitting: Family Medicine

## 2011-11-11 NOTE — Telephone Encounter (Signed)
Refill request

## 2011-12-10 ENCOUNTER — Other Ambulatory Visit: Payer: Self-pay | Admitting: Family Medicine

## 2011-12-12 NOTE — Telephone Encounter (Signed)
Refill request

## 2012-01-08 ENCOUNTER — Other Ambulatory Visit: Payer: Self-pay | Admitting: Family Medicine

## 2012-01-18 ENCOUNTER — Encounter: Payer: Self-pay | Admitting: Family Medicine

## 2012-01-18 ENCOUNTER — Telehealth: Payer: Self-pay | Admitting: Family Medicine

## 2012-01-18 ENCOUNTER — Ambulatory Visit (INDEPENDENT_AMBULATORY_CARE_PROVIDER_SITE_OTHER): Payer: Self-pay | Admitting: Family Medicine

## 2012-01-18 VITALS — BP 155/84 | HR 71 | Temp 98.2°F | Ht 64.0 in | Wt 269.0 lb

## 2012-01-18 DIAGNOSIS — E7800 Pure hypercholesterolemia, unspecified: Secondary | ICD-10-CM

## 2012-01-18 DIAGNOSIS — E119 Type 2 diabetes mellitus without complications: Secondary | ICD-10-CM

## 2012-01-18 DIAGNOSIS — M25562 Pain in left knee: Secondary | ICD-10-CM

## 2012-01-18 DIAGNOSIS — M25569 Pain in unspecified knee: Secondary | ICD-10-CM

## 2012-01-18 DIAGNOSIS — I10 Essential (primary) hypertension: Secondary | ICD-10-CM

## 2012-01-18 DIAGNOSIS — E669 Obesity, unspecified: Secondary | ICD-10-CM

## 2012-01-18 LAB — POCT GLYCOSYLATED HEMOGLOBIN (HGB A1C): Hemoglobin A1C: 6.2

## 2012-01-18 LAB — BASIC METABOLIC PANEL
BUN: 14 mg/dL (ref 6–23)
Chloride: 103 mEq/L (ref 96–112)
Glucose, Bld: 141 mg/dL — ABNORMAL HIGH (ref 70–99)
Potassium: 4 mEq/L (ref 3.5–5.3)

## 2012-01-18 LAB — LIPID PANEL
Cholesterol: 273 mg/dL — ABNORMAL HIGH (ref 0–200)
Triglycerides: 171 mg/dL — ABNORMAL HIGH (ref <150)

## 2012-01-18 MED ORDER — MELOXICAM 7.5 MG PO TABS: 7.5000 mg | ORAL_TABLET | Freq: Two times a day (BID) | ORAL | Status: DC

## 2012-01-18 MED ORDER — GABAPENTIN 300 MG PO CAPS: 300.0000 mg | ORAL_CAPSULE | Freq: Two times a day (BID) | ORAL | Status: DC

## 2012-01-18 MED ORDER — FLUTICASONE PROPIONATE 50 MCG/ACT NA SUSP: 1.0000 | Freq: Every day | NASAL | Status: DC

## 2012-01-18 NOTE — Patient Instructions (Signed)
It was great to see you today! No medication changes today. I would encourage you to keep an actual food diary (everything that you eat or drink) for a week, to see how many calories you're eating a day.  There are a lot of different websites that can help you keep track of calories-- ex. [employer] Our calorie goal each day should be between 1400-1500 calories.    Reapply for the orange card, then we can do physical therapy for your knee-- and we can do water therapy.   We are checking some labs today-- I'll send you a letter with your results in the next few weeks.  Come back in about 1-2 months.

## 2012-01-18 NOTE — Progress Notes (Signed)
S: Pt comes in today for follow up.  Current supplements include: B12, cinnamon, fish oil, Co-Q 10.   HYPERTENSION BP: 125-135/70-80 at home; 155/84 here (usually runs high, then improved on recheck) Meds: enalapril, HCTZ, metoprolol,  Taking meds: Yes     # of doses missed/week: 0 Symptoms: Headache: No Dizziness: No Vision changes: No SOB:  No Chest pain: No LE swelling: No Tobacco use: No   DIABETES Home CBGs: hasn't checked in "a while" (3 months) -- checks when she feels different  Meds: metformin, gabapentin (was taking TID, now BID b/c couldn't tolerate b/c of stomach problems-- indigestion) Taking Meds: yes # of doses missed per week: 0 Hypoglycemic episodes?: no Last foot exam: TODAY Last eye exam: >1 year ago, has not seen yet b/c of financial constraints  Symptoms: Polyuria: no Polydipsia: no Parasthesias: no   Dizziness: no  Nausea:  no Vomiting:  no Last A1c: today, 6.2   HYPERLIPIDEMIA/OBESITY  Meds: crestor, fish oil -- is only taking crestor every other day because of cost issue (only one that she can tolerate and not have muscle aches) Muscle aches: no Last FLP or LDL:  Lab Results  Component Value Date   LDLCALC 143* 01/11/2011   Lab Results  Component Value Date   CHOL 243* 01/11/2011   HDL 65 01/11/2011   LDLCALC 045* 01/11/2011   LDLDIRECT 167* 08/16/2011   TRIG 174* 01/11/2011   CHOLHDL 3.7 01/11/2011    Diet: nothing in particular; tries to control portion sizes; eats grilled/baked, mostly fish (no fried foods), eats grits but not many other carbs; eats veggies/fruits every day-- ~ 4 servings per day Exercise: sit down exercises, can't walk b/c of knee pain; stretches but knows she isn't increasing her heart rate; gazelle at home she can sometimes use.   Weight:  Wt Readings from Last 3 Encounters:  01/18/12 269 lb (122.018 kg)  10/21/11 265 lb (120.203 kg)  09/30/11 271 lb (122.925 kg)    KNEE PAIN Now right knee is also giving her the same problems  as her left.  Using cane. Is wondering if she could have a handicap placard since she has difficulty walking and is curious if she would qualify for disability.  Is unable to do most cardio exercise because of the pain.     ROS: Per HPI  History  Smoking status  . Former Smoker  Smokeless tobacco  . Never Used    O:  Filed Vitals:   01/18/12 1014  BP: 155/84  Pulse: 71  Temp: 98.2 F (36.8 C)    Gen: NAD, obese CV: RRR, no murmur Pulm: CTA bilat, no wheezes or crackles Ext: Warm, no chronic skin changes, no edema; uses cane for mobility issues  DIABETIC FOOT EXAM: no calluses or blisters; right great toe with thickened toe nail, otherwise toe nails normal; sensation intact on dorsal and plantar surfaces bilaterally     A/P: 56 y.o. female p/w HLD, HTN, DM -See problem list -f/u in 1-2 month for weight check

## 2012-01-18 NOTE — Assessment & Plan Note (Signed)
Will check FLP.  Pt only taking crestor qod because it is expensive, but unwilling to try a different medication because she does not tolerate other statins (muscle aches).  Will also continue fish oil.

## 2012-01-18 NOTE — Telephone Encounter (Signed)
Handicapped placard completed and placed in Dr. Jamie Kato box for signature. Ileana Ladd

## 2012-01-18 NOTE — Telephone Encounter (Signed)
Patient dropped off form to be filled out for disability placard.  Please call her when completed.

## 2012-01-18 NOTE — Telephone Encounter (Signed)
Completed.

## 2012-01-18 NOTE — Assessment & Plan Note (Signed)
Decreased gabapentin to BID on her own due to reflux-like symptoms.  A1c good today, 6.2, no changes in metformin.  Foot exam normal.  Encouraged pt to have eye exam done, having financial constraints.

## 2012-01-18 NOTE — Assessment & Plan Note (Signed)
Taking meloxicam 15mg  (7.5 BID) with some relief, still with pain.  Tried naproxen first, which did not help.  Now bilateral.  Had injection in left knee 10/05/11.  Knows that she needs to lose weight.  Will refer for aquatic PT once she has renewed her orange card.

## 2012-01-18 NOTE — Assessment & Plan Note (Signed)
Well controlled at home, will check BMET. Cont enalapril, HCTZ, metoprolol.

## 2012-01-18 NOTE — Assessment & Plan Note (Signed)
Pt regained 4#.  Difficult to exercise due to knee pain.  Will have pt keep food/calorie diary for 1 week to see how many calories she is eating.  Will try aquatic PT for knees once orange card is renewed.   Wt Readings from Last 3 Encounters:  01/18/12 269 lb (122.018 kg)  10/21/11 265 lb (120.203 kg)  09/30/11 271 lb (122.925 kg)

## 2012-01-19 ENCOUNTER — Encounter: Payer: Self-pay | Admitting: Family Medicine

## 2012-01-19 NOTE — Telephone Encounter (Signed)
Left message of voicemail that Hand[employer] Placard is ready to be picked up at the front desk.  Ileana Ladd

## 2012-02-14 ENCOUNTER — Other Ambulatory Visit: Payer: Self-pay | Admitting: Family Medicine

## 2012-02-14 DIAGNOSIS — I10 Essential (primary) hypertension: Secondary | ICD-10-CM

## 2012-02-14 MED ORDER — HYDROCHLOROTHIAZIDE 25 MG PO TABS: 25.0000 mg | ORAL_TABLET | Freq: Every day | ORAL | Status: DC

## 2012-02-14 MED ORDER — ENALAPRIL MALEATE 20 MG PO TABS: 40.0000 mg | ORAL_TABLET | Freq: Every day | ORAL | Status: DC

## 2012-02-24 ENCOUNTER — Other Ambulatory Visit: Payer: Self-pay | Admitting: Family Medicine

## 2012-02-24 DIAGNOSIS — E119 Type 2 diabetes mellitus without complications: Secondary | ICD-10-CM

## 2012-02-24 MED ORDER — GABAPENTIN 300 MG PO CAPS: 300.0000 mg | ORAL_CAPSULE | Freq: Three times a day (TID) | ORAL | Status: DC

## 2012-03-02 ENCOUNTER — Ambulatory Visit: Payer: Self-pay | Admitting: Family Medicine

## 2012-03-28 ENCOUNTER — Other Ambulatory Visit: Payer: Self-pay | Admitting: *Deleted

## 2012-03-28 MED ORDER — METFORMIN HCL 500 MG PO TABS: 500.0000 mg | ORAL_TABLET | Freq: Two times a day (BID) | ORAL | Status: DC

## 2012-06-12 ENCOUNTER — Ambulatory Visit: Payer: Self-pay | Admitting: Family Medicine

## 2012-06-20 ENCOUNTER — Ambulatory Visit (INDEPENDENT_AMBULATORY_CARE_PROVIDER_SITE_OTHER): Payer: Self-pay | Admitting: Family Medicine

## 2012-06-20 ENCOUNTER — Encounter: Payer: Self-pay | Admitting: Family Medicine

## 2012-06-20 VITALS — BP 151/85 | HR 58 | Temp 98.4°F | Ht 64.0 in | Wt 268.0 lb

## 2012-06-20 DIAGNOSIS — I10 Essential (primary) hypertension: Secondary | ICD-10-CM

## 2012-06-20 DIAGNOSIS — E669 Obesity, unspecified: Secondary | ICD-10-CM

## 2012-06-20 DIAGNOSIS — E119 Type 2 diabetes mellitus without complications: Secondary | ICD-10-CM

## 2012-06-20 DIAGNOSIS — E7800 Pure hypercholesterolemia, unspecified: Secondary | ICD-10-CM

## 2012-06-20 NOTE — Progress Notes (Signed)
S: Pt comes in today for follow up.  DIABETES Home CBGs: not checking Meds: metformin 500 BID Taking Meds: yes # of doses missed per week: 0 Hypoglycemic episodes?: no Last foot exam: TODAY Last eye exam: 2011 Symptoms: Polyuria: no Polydipsia: no Parasthesias: no   Dizziness: no  Nausea:  no Vomiting:  no Last A1c:  Lab Results  Component Value Date   HGBA1C 6.2 06/20/2012     HYPERTENSION BP: 151/85; usually checks at home but hasn't checked it lately; doesn't feel like it has been high Meds: enalapril 40, HCTZ 25, metoprolol 25 BID Taking meds: Yes     # of doses missed/week: 0; but forgot to take HCTZ this AM Symptoms: Headache: No Dizziness: No Vision changes: No SOB:  No Chest pain: No LE swelling: Yes : esp after walking, in ankles and feet Tobacco use: No   HYPERLIPIDEMIA /OBESITY Meds: crestor 10-- still only taking every other day because of cost; taking 2 fish oil per day (1200mg ) Muscle aches: no Last FLP or LDL:  Lab Results  Component Value Date   LDLCALC 161* 01/18/2012   Lab Results  Component Value Date   CHOL 273* 01/18/2012   HDL 78 01/18/2012   LDLCALC 161* 01/18/2012   LDLDIRECT 167* 08/16/2011   TRIG 171* 01/18/2012   CHOLHDL 3.5 01/18/2012   Weight:  Wt Readings from Last 3 Encounters:  06/20/12 268 lb (121.564 kg)  01/18/12 269 lb (122.018 kg)  10/21/11 265 lb (120.203 kg)   Diet: trying to decrease portions; greens, fruit, water are constant; some days will have red meat or BBQ Exercise: walks a lot at work-- some days thinks she walks a total of 4 hours, other days is 30 min-1hr    ROS: Per HPI  History  Smoking status  . Former Smoker  Smokeless tobacco  . Never Used    O:  Filed Vitals:   06/20/12 1428  BP: 151/85  Pulse: 58  Temp: 98.4 F (36.9 C)    Gen: NAD, onese CV: RRR, no murmur Pulm: CTA bilat, no wheezes or crackles Ext: Warm, no chronic skin changes, trace BLE pitting edema   A/P: 56 y.o. female p/w HTN,  HLD, obesity, DM -See problem list -f/u in 1 month

## 2012-06-20 NOTE — Assessment & Plan Note (Signed)
A1c good, no changes. Cont metformin.   Lab Results  Component Value Date   HGBA1C 6.2 06/20/2012

## 2012-06-20 NOTE — Assessment & Plan Note (Signed)
Pt reports good control at home, but always elevated in office. Consider ambulatory BP monitoring to determine if we are under treating BPs (goal is <130/80 b/c DM).  Continue enalapril, metoprolol, HCTZ

## 2012-06-20 NOTE — Assessment & Plan Note (Signed)
LDL 161 01/2012; still taking crestor qod b/ it is expensive and she is unwilling to change due to intolerances of other statins.  Encouraged pt to search online for discounts or coupons.

## 2012-06-20 NOTE — Patient Instructions (Addendum)
It was good to see you today.  Your diabetes is still under good control!  Continue the metformin twice a day.  Go get your eyes checked!!!  Your blood pressure was a little high today.  With your diabetes, you blood pressure goal is <130/80. Start checking your blood pressure at home at least 3 times a week. Write them down, and bring them in for your next appt.  We can consider home blood pressure monitoring x24 hours if these are borderline.   Let me know if you need me to fill anything out to help with the Crestor and trying to get it for cheaper.   Come back in about 1 month so we can check on your blood pressures.

## 2012-06-20 NOTE — Assessment & Plan Note (Signed)
Has not had time to exercise, although walking more with her new job.  Some days she only has to work 4 hours, so encouraged her to have purposeful exercise on those shorter days. Wt Readings from Last 3 Encounters:  06/20/12 268 lb (121.564 kg)  01/18/12 269 lb (122.018 kg)  10/21/11 265 lb (120.203 kg)

## 2012-07-19 ENCOUNTER — Encounter: Payer: Self-pay | Admitting: Family Medicine

## 2012-07-19 ENCOUNTER — Ambulatory Visit (INDEPENDENT_AMBULATORY_CARE_PROVIDER_SITE_OTHER): Payer: Self-pay | Admitting: Family Medicine

## 2012-07-19 VITALS — BP 156/78 | HR 66 | Temp 98.1°F | Ht 64.0 in | Wt 269.0 lb

## 2012-07-19 DIAGNOSIS — I10 Essential (primary) hypertension: Secondary | ICD-10-CM

## 2012-07-19 MED ORDER — METOPROLOL TARTRATE 25 MG PO TABS: 50.0000 mg | ORAL_TABLET | Freq: Two times a day (BID) | ORAL | Status: DC

## 2012-07-19 NOTE — Patient Instructions (Addendum)
It was good to see you today.  Your blood pressure is still a little high.  We decided to try increasing the metoprolol to 50mg  2x per day-- take 2 pills in the morning and 2 in the evening.  We will wait to give you a new pill at the higher dose until we see how you do with it.  If you start getting light headed with standing or exercising, please come in sooner.  Please come back in 1 month.

## 2012-07-19 NOTE — Progress Notes (Signed)
S: Pt comes in today for follow up of hypertension.  HYPERTENSION BP: 109/75 in left; 134/81 in right --> 152/74 on left, manual cuff; 156/78 on right, manual cuff Meds: enalapril 40, HCTZ 25, metoprolol 25 BID Taking meds: Yes     # of doses missed/week: 0 Symptoms: Headache: No Dizziness: No Vision changes: No SOB:  No Chest pain: No LE swelling: No Tobacco use: No Has started doing her gazelle for 15 min 3x/week  Does have stress at home    ROS: Per HPI  History  Smoking status  . Former Smoker  Smokeless tobacco  . Never Used    O:  Filed Vitals:   07/19/12 1336  BP: 109/75  Pulse: 75  Temp: 98.1 F (36.7 C)    Gen: NAD CV: RRR, no murmur Pulm: CTA bilat, no wheezes or crackles Ext: Warm, no chronic skin changes, mild BLE pitting edema   A/P: 56 y.o. female p/w HTN -See problem list -f/u in 1 month

## 2012-07-19 NOTE — Assessment & Plan Note (Signed)
Pt reports good compliance.  Would like to just increase her metoprolol rather than doing ambulatory BP monitoring b/c her schedule is sporadic and it is difficult to get here 2 days in a row-- likely this will just show her BPs are high and that we need to increase her medicines.  She would also prefer to increase metoprolol to 50 BID rather than adding a new agent such as norvasc. Cont enalapril and HCTZ (both at max doses).

## 2012-08-08 ENCOUNTER — Other Ambulatory Visit: Payer: Self-pay | Admitting: *Deleted

## 2012-08-08 DIAGNOSIS — I10 Essential (primary) hypertension: Secondary | ICD-10-CM

## 2012-08-08 MED ORDER — ENALAPRIL MALEATE 20 MG PO TABS: 40.0000 mg | ORAL_TABLET | Freq: Every day | ORAL | Status: DC

## 2012-08-14 ENCOUNTER — Other Ambulatory Visit: Payer: Self-pay | Admitting: *Deleted

## 2012-08-14 DIAGNOSIS — M25562 Pain in left knee: Secondary | ICD-10-CM

## 2012-08-14 MED ORDER — MELOXICAM 7.5 MG PO TABS: 7.5000 mg | ORAL_TABLET | Freq: Two times a day (BID) | ORAL | Status: DC

## 2012-08-21 ENCOUNTER — Encounter: Payer: Self-pay | Admitting: Family Medicine

## 2012-08-21 ENCOUNTER — Ambulatory Visit (INDEPENDENT_AMBULATORY_CARE_PROVIDER_SITE_OTHER): Payer: Self-pay | Admitting: Family Medicine

## 2012-08-21 VITALS — BP 136/77 | HR 74 | Ht 63.5 in | Wt 265.0 lb

## 2012-08-21 DIAGNOSIS — I10 Essential (primary) hypertension: Secondary | ICD-10-CM

## 2012-08-21 NOTE — Progress Notes (Signed)
S: Pt comes in today for follow up.  Will be meeting with Britta Mccreedy tomorrow to have orange card renewed.    HYPERTENSION BP: 136/77 Meds: enalapril 40, HCTZ 25, metoprolol 50 BID Taking meds: Yes     # of doses missed/week: 0 Symptoms: Headache: No Dizziness: No Vision changes: No SOB:  No Chest pain: No LE swelling: No Tobacco use: No Diet: has been trying to drink less coffee     ROS: Per HPI  History  Smoking status  . Former Smoker  Smokeless tobacco  . Never Used    O:  Filed Vitals:   08/21/12 1334  BP: 136/77  Pulse: 74    Gen: NAD CV: RRR, no murmur Pulm: CTA bilat, no wheezes or crackles Ext: Warm, no chronic skin changes, no edema   A/P: 56 y.o. female p/w HTN -See problem list -f/u in 1 month

## 2012-08-21 NOTE — Assessment & Plan Note (Signed)
Goal BP <130/80, still just above but patietn would like to wait to increase medications.  Cont enalapril 40, HCTZ 25, metoprolol 50 BID.  Consider increasing metoprolol to 100 BID if still above goal vs starting norvasc if HR will not allow higher BB. F/u 1 month for BP and A1c check.

## 2012-08-21 NOTE — Patient Instructions (Addendum)
It was good to see you today.  Your blood pressure is still a little higher than our goal of <130/80 (it was 136/77).  We have decided to wait to increase your metoprolol any further.  Keep taking the 2 tablets twice a day.  Keep taking the enalapril and HCTZ.  We will recheck your blood pressure in about 1 month. It will be time for your A1c at that visit to check on your diabetes.  We can also do you flu shot then.    In the next month, keep staying away from salt and caffeine and we will see if that helps the blood pressure.

## 2012-09-08 ENCOUNTER — Other Ambulatory Visit: Payer: Self-pay | Admitting: *Deleted

## 2012-09-08 DIAGNOSIS — I10 Essential (primary) hypertension: Secondary | ICD-10-CM

## 2012-09-08 MED ORDER — HYDROCHLOROTHIAZIDE 25 MG PO TABS: 25.0000 mg | ORAL_TABLET | Freq: Every day | ORAL | Status: DC

## 2012-09-20 ENCOUNTER — Encounter: Payer: Self-pay | Admitting: Family Medicine

## 2012-09-20 ENCOUNTER — Ambulatory Visit (INDEPENDENT_AMBULATORY_CARE_PROVIDER_SITE_OTHER): Payer: Self-pay | Admitting: Family Medicine

## 2012-09-20 VITALS — BP 188/97 | HR 80 | Ht 63.5 in | Wt 272.0 lb

## 2012-09-20 DIAGNOSIS — I10 Essential (primary) hypertension: Secondary | ICD-10-CM

## 2012-09-20 DIAGNOSIS — Z23 Encounter for immunization: Secondary | ICD-10-CM

## 2012-09-20 DIAGNOSIS — E7800 Pure hypercholesterolemia, unspecified: Secondary | ICD-10-CM

## 2012-09-20 DIAGNOSIS — E669 Obesity, unspecified: Secondary | ICD-10-CM

## 2012-09-20 DIAGNOSIS — E119 Type 2 diabetes mellitus without complications: Secondary | ICD-10-CM

## 2012-09-20 NOTE — Assessment & Plan Note (Signed)
Weight has increased, pt reports this is from dad being in hospital and being off her regular diet/exercise.  Will monitor.

## 2012-09-20 NOTE — Assessment & Plan Note (Signed)
Unwilling to change from Crestor even tho she can only take it qod b/c of cost.  Check FLP or direct LDL.

## 2012-09-20 NOTE — Progress Notes (Signed)
S: Pt comes in today for follow up.  HYPERTENSION BP: 188/97; at home is usually 138-140's/60's-70s Meds: enalapril 40, HCTZ 25, metoprolol 50 BID Taking meds: Yes     # of doses missed/week: usually NONE, but did miss yesterday's dose because father is in hospital so hasn't been home Symptoms: Headache: No Dizziness: No Vision changes: No SOB:  Yes : if passes 12 hour mark for taking medicine Chest pain: No LE swelling: No Tobacco use: No   DIABETES Home CBGs: hasn't checked since last month, hasn't felt like it has been high or low Meds: metformin 500 BID Taking Meds: yes # of doses missed per week: 0 Hypoglycemic episodes?: no Symptoms: Polyuria: no Polydipsia: no Parasthesias: no   Dizziness: no  Nausea:  no Vomiting:  no Last A1c: TODAY --> 6.5 (was 6.2 06/2012)   HYPERLIPIDEMIA /OBESITY Meds: Crestor- taking qod due to cost but will not consider change in statin due to previous intolerances  Muscle aches: no Last FLP or LDL:  Lab Results  Component Value Date   LDLCALC 161* 01/18/2012   Lab Results  Component Value Date   CHOL 273* 01/18/2012   HDL 78 01/18/2012   LDLCALC 161* 01/18/2012   LDLDIRECT 167* 08/16/2011   TRIG 171* 01/18/2012   CHOLHDL 3.5 01/18/2012    Diet: eating at the hospital for the past for weeks while dad has been in the hospital; before that was trying to eat mostly veggies but is still using dressing and mac n' cheese; oatmeal with blueberries for breakfast most days  Exercise: is doing gazelle 3 times per week usually 15-20 min, using upper body weights and ball 2-3 days per week   Weight:  Wt Readings from Last 3 Encounters:  09/20/12 272 lb (123.378 kg)  08/21/12 265 lb (120.203 kg)  07/19/12 269 lb (122.018 kg)     ROS: Per HPI  History  Smoking status  . Former Smoker  Smokeless tobacco  . Never Used    O:  Filed Vitals:   09/20/12 0854  BP: 188/97  Pulse: 80    Gen: NAD CV: RRR, no murmur Pulm: CTA bilat, no wheezes or  crackles Ext: Warm, no edema   A/P: 56 y.o. female p/w HTN, HLD, obesity, DM -See problem list -f/u in 1 month

## 2012-09-20 NOTE — Assessment & Plan Note (Signed)
A1c 6.5, continue metformin 500 BID

## 2012-09-20 NOTE — Assessment & Plan Note (Signed)
Still above goal of <130/80.  No meds today which likely accounts for reading that is higher than usual.  Patient pleaded with me to wait to increase metoprolol, so agreed to wait for one more month.  Continue enalapril 40, HCTZ 25, metoprolol 50 BID.  Will increase metoprolol vs adding norvasc at f/u.

## 2012-09-20 NOTE — Patient Instructions (Addendum)
It was good to see you (again) today.  Your diabetes looks good-- keep taking the metformin.  Your A1c was 6.5!  Our goal is <7 so you are doing well!  Your blood pressure was really high today!  We decided that we will wait ONE more month to increase your metoprolol.  If it is still high in January (meaning >130/80), we will increase your medicine.  Keep up the great work with walking and trying to eat right!  You can always see Dr. Gerilyn Pilgrim, our nutritionist, if you want extra tips or tricks.  You can schedule an appointment directly with her by calling [phone].   Have a great Christmas! Come back to see me in about 1 month.

## 2012-10-05 ENCOUNTER — Other Ambulatory Visit: Payer: Self-pay | Admitting: *Deleted

## 2012-10-05 DIAGNOSIS — I10 Essential (primary) hypertension: Secondary | ICD-10-CM

## 2012-10-05 MED ORDER — METOPROLOL TARTRATE 25 MG PO TABS: 50.0000 mg | ORAL_TABLET | Freq: Two times a day (BID) | ORAL | Status: DC

## 2012-10-09 ENCOUNTER — Other Ambulatory Visit: Payer: Self-pay | Admitting: *Deleted

## 2012-10-09 DIAGNOSIS — E119 Type 2 diabetes mellitus without complications: Secondary | ICD-10-CM

## 2012-10-09 MED ORDER — GABAPENTIN 300 MG PO CAPS: 300.0000 mg | ORAL_CAPSULE | Freq: Three times a day (TID) | ORAL | Status: DC

## 2012-10-24 ENCOUNTER — Encounter: Payer: Self-pay | Admitting: Family Medicine

## 2012-10-24 ENCOUNTER — Ambulatory Visit (INDEPENDENT_AMBULATORY_CARE_PROVIDER_SITE_OTHER): Payer: Self-pay | Admitting: Family Medicine

## 2012-10-24 VITALS — BP 145/80 | HR 60 | Ht 63.5 in | Wt 266.0 lb

## 2012-10-24 DIAGNOSIS — I10 Essential (primary) hypertension: Secondary | ICD-10-CM

## 2012-10-24 MED ORDER — CHLORTHALIDONE 25 MG PO TABS: 25.0000 mg | ORAL_TABLET | Freq: Every day | ORAL | Status: DC

## 2012-10-24 NOTE — Progress Notes (Signed)
S: Pt comes in today for follow up.  HYPERTENSION BP: 145/80 Meds: enalapril 40, HCTZ 25, metoprolol 50 BID Taking meds: Yes     # of doses missed/week: 0 Symptoms: Headache: No Dizziness: No Vision changes: No SOB:  No Chest pain: No LE swelling: No Tobacco use: No    ROS: Per HPI  History  Smoking status  . Former Smoker  Smokeless tobacco  . Never Used    O:  Filed Vitals:   10/24/12 0920  BP: 145/80  Pulse: 60    Gen: NAD CV: RRR, no murmur Pulm: CTA bilat, no wheezes or crackles Ext: Warm, no edema   A/P: 57 y.o. female p/w HTN -See problem list -f/u in 1 month

## 2012-10-24 NOTE — Assessment & Plan Note (Signed)
Per JNC-8, new goal is <140/90, which she is still above.  Will try change HCTZ to chlorthalidone 25mg - can increase up to 100mg  daily if needed.  Continue enalapril 40, metoprolol 50 BID (cannot increase due to HR of 60)/ F/u 1 month.  Check BMET at that time.

## 2012-10-24 NOTE — Patient Instructions (Addendum)
It was good to see you today.  Your blood pressure is closer!  STOP the HCTZ.  [facility] in a different medicine, [facility] ([facility] should be [facility]4 [facility]).  Come back in about 4 weeks and we will see if the different medicine is helping.  We should check some blood work then.

## 2012-10-30 ENCOUNTER — Telehealth: Payer: Self-pay | Admitting: Family Medicine

## 2012-10-30 NOTE — Telephone Encounter (Signed)
Will fwd. To PCP for review .[name]  

## 2012-10-30 NOTE — Telephone Encounter (Signed)
Daughter is calling requesting that the paperwork for FMLA from [employer] not be completed.  The daughter would like to request that the paperwork be put on hold for now.

## 2012-10-31 NOTE — Telephone Encounter (Signed)
Ok, I will leave in my box until family calls and requests that it be filled out. Thanks!

## 2012-11-21 ENCOUNTER — Other Ambulatory Visit: Payer: Self-pay | Admitting: *Deleted

## 2012-11-21 DIAGNOSIS — M25562 Pain in left knee: Secondary | ICD-10-CM

## 2012-11-21 MED ORDER — MELOXICAM 7.5 MG PO TABS: 7.5000 mg | ORAL_TABLET | Freq: Two times a day (BID) | ORAL | Status: DC

## 2012-11-22 ENCOUNTER — Encounter: Payer: Self-pay | Admitting: Family Medicine

## 2012-11-22 ENCOUNTER — Other Ambulatory Visit: Payer: Self-pay | Admitting: *Deleted

## 2012-11-22 ENCOUNTER — Ambulatory Visit (INDEPENDENT_AMBULATORY_CARE_PROVIDER_SITE_OTHER): Payer: Self-pay | Admitting: Family Medicine

## 2012-11-22 VITALS — BP 142/71 | HR 65 | Temp 97.8°F | Ht 63.5 in | Wt 264.0 lb

## 2012-11-22 DIAGNOSIS — M25569 Pain in unspecified knee: Secondary | ICD-10-CM

## 2012-11-22 DIAGNOSIS — M25561 Pain in right knee: Secondary | ICD-10-CM

## 2012-11-22 DIAGNOSIS — I10 Essential (primary) hypertension: Secondary | ICD-10-CM

## 2012-11-22 LAB — COMPREHENSIVE METABOLIC PANEL
BUN: 20 mg/dL (ref 6–23)
CO2: 26 mEq/L (ref 19–32)
Calcium: 9.3 mg/dL (ref 8.4–10.5)
Chloride: 103 mEq/L (ref 96–112)
Creat: 0.86 mg/dL (ref 0.50–1.10)
Glucose, Bld: 155 mg/dL — ABNORMAL HIGH (ref 70–99)

## 2012-11-22 MED ORDER — METFORMIN HCL 500 MG PO TABS: 500.0000 mg | ORAL_TABLET | Freq: Two times a day (BID) | ORAL | Status: DC

## 2012-11-22 NOTE — Assessment & Plan Note (Signed)
Still slightly elevated, will increase chlorthalidone to 50mg , cont enalapril 40, metoprolol 50 BID

## 2012-11-22 NOTE — Patient Instructions (Signed)
It was good to see you today.  Plan on coming Friday morning for injections for your knees.  Increase your chlorthalidone to 50mg  (2 pills) every day.  We are checking some blood work today.  I will see you Friday!

## 2012-11-22 NOTE — Assessment & Plan Note (Signed)
Interested in injection, has too much to do today but will f/u for B injections on Friday. Already taking Tylenol and antiinflam (Mobic, previously failed naprosyn)

## 2012-11-22 NOTE — Progress Notes (Signed)
S: Pt comes in today for follow up.  HYPERTENSION BP: 142/71 BP Readings from Last 3 Encounters:  11/22/12 142/71  10/24/12 145/80  09/20/12 188/97   Meds: enalapril 40, metoprolol 50 BID, chlorthalidone 25 (replaced HCTZ 10/24/12) Taking meds: Yes     # of doses missed/week: 0 Symptoms: Headache: No Dizziness: No Vision changes: No SOB:  No Chest pain: No LE swelling: No Tobacco use: No   KNEE PAIN Bilateral knee pain, pain with walking- particularly bad today. Pain when going to stand up after sitting for long period of time.  Has been taking Tylenol Arthritis , helps some but still with significant pain.  10/10 today, average 5/10.  Having difficulty with doing things she needs to do-- very painful after working all day.  Wants to exercise to lose weight, but unable to do it with pain in her knees.  Has had issues for ~1 year, left knee initially was worse but now both bother her the same amount.  Had steroid injection in left knee ~1 year ago, which helped.  Pain with sitting for >1 hour at a time.    Previously tried naproxen, which didn't help, is now on Mobic 7.5mg  BID, but still having pain.    ROS: Per HPI  History  Smoking status  . Former Smoker  Smokeless tobacco  . Never Used    O:  Filed Vitals:   11/22/12 0904  BP: 142/71  Pulse: 65  Temp: 97.8 F (36.6 C)    Gen: NAD CV: RRR, no murmur Pulm: CTA bilat, no wheezes or crackles Ext: Warm, no edema B Knee: no effusion or swelling, no tenderness to palpation, mildly limited ROM bilaterally   A/P: 57 y.o. female p/w knee pain, HTN -See problem list -f/u in days for injections

## 2012-11-24 ENCOUNTER — Encounter: Payer: Self-pay | Admitting: Family Medicine

## 2012-11-24 ENCOUNTER — Ambulatory Visit (INDEPENDENT_AMBULATORY_CARE_PROVIDER_SITE_OTHER): Payer: Self-pay | Admitting: Family Medicine

## 2012-11-24 VITALS — BP 166/83 | HR 72 | Ht 63.5 in | Wt 264.0 lb

## 2012-11-24 DIAGNOSIS — M25561 Pain in right knee: Secondary | ICD-10-CM

## 2012-11-24 DIAGNOSIS — M25569 Pain in unspecified knee: Secondary | ICD-10-CM

## 2012-11-24 NOTE — Progress Notes (Signed)
S: Pt comes in today for follow up of bilateral knee pain.  Patient was seen 2 days ago and desired injection for her knee pain, but was unable to have it done that day and wishes to schedule it for a day where she did not have as much to do and could rest with anticipation of some increase pain post injection.  Has had issues for ~1 year, left knee initially was worse but now both bother her the same amount. Had steroid injection in left knee ~1 year ago, which helped.  Has tried both mobic and naproxen in addition to Tylenol.  Pain is achy, midline, nonspecific.  Worse with stairs (she tries to avoid these), and worse after standing or sitting for long periods of time.    ROS: Per HPI  History  Smoking status  . Former Smoker  Smokeless tobacco  . Never Used    O:  Filed Vitals:   11/24/12 0841  BP: 166/83  Pulse: 72    Gen: NAD Knees: no effusion or swelling, no tenderness to palpation, mildly limited ROM bilaterally   A/P: 57 y.o. female p/w B knee pain -See problem list -f/u in 1 month for BP   PROCEDURE NOTE INJECTION: Patient was given informed consent, signed copy in the chart. Appropriate time out was taken. Area prepped in usual sterile fashion with alcohol and betadine. 1 cc of depomedrol 40 mg/ml plus  4 cc of 1% lidocaine without epinephrine was injected into the right knee using a medial approach followed by 1 cc of depomedrol 40 mg/ml plus  4 cc of 1% lidocaine without epinephrine into the left knee using a medial approach. The patient tolerated the procedure well. There were no complications. Post procedure instructions were given including icing for pain tonight, signs/symptoms of infection, and red flags for return.

## 2012-11-24 NOTE — Assessment & Plan Note (Signed)
Bilateral injections done.  Discussed red flags for infection.  Discussed ice tonight if become achy.  F/u 1 month for BP recheck, will f/u if injections helped.  Consider new xrays (most recent 09/2011) and/or PT.  Could try switching naprosyn to diclofenac.

## 2012-12-13 ENCOUNTER — Telehealth: Payer: Self-pay | Admitting: Family Medicine

## 2012-12-13 DIAGNOSIS — I10 Essential (primary) hypertension: Secondary | ICD-10-CM

## 2012-12-13 MED ORDER — METOPROLOL TARTRATE 25 MG PO TABS: 50.0000 mg | ORAL_TABLET | Freq: Two times a day (BID) | ORAL | Status: DC

## 2012-12-13 NOTE — Telephone Encounter (Signed)
Pt states that pharmacy has sent request for metoprolol and we have not rec'd it yet.  She is now out and needs to know what to do.  [facility] [address] -

## 2012-12-13 NOTE — Telephone Encounter (Signed)
Message left on patient's  voicemail that we have not received any refill request from  pharmacy . Advised I will send in refill now for one month supply and will send message to Dr. Fara Boros for further refills.

## 2012-12-25 ENCOUNTER — Other Ambulatory Visit: Payer: Self-pay | Admitting: *Deleted

## 2012-12-25 DIAGNOSIS — M25562 Pain in left knee: Secondary | ICD-10-CM

## 2012-12-25 MED ORDER — MELOXICAM 7.5 MG PO TABS: 7.5000 mg | ORAL_TABLET | Freq: Two times a day (BID) | ORAL | Status: DC

## 2013-01-01 ENCOUNTER — Telehealth: Payer: Self-pay | Admitting: Family Medicine

## 2013-01-01 DIAGNOSIS — I10 Essential (primary) hypertension: Secondary | ICD-10-CM

## 2013-01-01 MED ORDER — METOPROLOL TARTRATE 25 MG PO TABS: 50.0000 mg | ORAL_TABLET | Freq: Two times a day (BID) | ORAL | Status: DC

## 2013-01-01 NOTE — Telephone Encounter (Signed)
Woops, sent in the correct number (120)

## 2013-01-01 NOTE — Telephone Encounter (Signed)
Fwd. To PCP to address .[name]  

## 2013-01-01 NOTE — Telephone Encounter (Signed)
Was given  Script for metoprolol and is supposed to take 2- 2xdaily - only got it for #60.  [facility]eds [facility] is supposed to contact, but patient was afraid they will drop the ball.

## 2013-01-16 ENCOUNTER — Other Ambulatory Visit: Payer: Self-pay | Admitting: Family Medicine

## 2013-02-06 ENCOUNTER — Other Ambulatory Visit: Payer: Self-pay | Admitting: *Deleted

## 2013-02-06 DIAGNOSIS — M25562 Pain in left knee: Secondary | ICD-10-CM

## 2013-02-06 MED ORDER — MELOXICAM 7.5 MG PO TABS: 7.5000 mg | ORAL_TABLET | Freq: Two times a day (BID) | ORAL | Status: DC

## 2013-02-08 ENCOUNTER — Telehealth: Payer: Self-pay | Admitting: Family Medicine

## 2013-02-08 NOTE — Telephone Encounter (Signed)
Called pt. Left message with pt's sister to call us back. PLEASE tell pt, that she can have the medication transferred. She needs to tell the pharmacist to send the Rx to the pharmacy of pt's choice. Thank you. Lorenda Hatchet, Renato Battles

## 2013-02-08 NOTE — Telephone Encounter (Signed)
Patient is calling because her Rx for Meloxicam was supposed to be sent to [facility] [address] because it is more affordable there, but [facility] and the patient needs this corrected today please.

## 2013-02-16 ENCOUNTER — Other Ambulatory Visit: Payer: Self-pay | Admitting: *Deleted

## 2013-02-16 ENCOUNTER — Other Ambulatory Visit: Payer: Self-pay | Admitting: Family Medicine

## 2013-02-16 DIAGNOSIS — I10 Essential (primary) hypertension: Secondary | ICD-10-CM

## 2013-02-16 MED ORDER — ENALAPRIL MALEATE 20 MG PO TABS: 40.0000 mg | ORAL_TABLET | Freq: Every day | ORAL | Status: DC

## 2013-03-15 ENCOUNTER — Ambulatory Visit: Payer: Self-pay | Admitting: Family Medicine

## 2013-03-15 ENCOUNTER — Telehealth: Payer: Self-pay | Admitting: Family Medicine

## 2013-03-16 ENCOUNTER — Other Ambulatory Visit: Payer: Self-pay | Admitting: *Deleted

## 2013-03-16 DIAGNOSIS — I10 Essential (primary) hypertension: Secondary | ICD-10-CM

## 2013-03-16 DIAGNOSIS — M25562 Pain in left knee: Secondary | ICD-10-CM

## 2013-03-16 MED ORDER — MELOXICAM 7.5 MG PO TABS: 7.5000 mg | ORAL_TABLET | Freq: Two times a day (BID) | ORAL | Status: DC

## 2013-03-16 MED ORDER — ENALAPRIL MALEATE 20 MG PO TABS: 40.0000 mg | ORAL_TABLET | Freq: Every day | ORAL | Status: DC

## 2013-03-21 ENCOUNTER — Ambulatory Visit (INDEPENDENT_AMBULATORY_CARE_PROVIDER_SITE_OTHER): Payer: Self-pay | Admitting: Family Medicine

## 2013-03-21 ENCOUNTER — Encounter: Payer: Self-pay | Admitting: Family Medicine

## 2013-03-21 VITALS — BP 165/84 | HR 70 | Ht 63.5 in | Wt 268.0 lb

## 2013-03-21 DIAGNOSIS — E119 Type 2 diabetes mellitus without complications: Secondary | ICD-10-CM

## 2013-03-21 DIAGNOSIS — I10 Essential (primary) hypertension: Secondary | ICD-10-CM

## 2013-03-21 LAB — POCT GLYCOSYLATED HEMOGLOBIN (HGB A1C): Hemoglobin A1C: 6.4

## 2013-03-21 MED ORDER — ENALAPRIL MALEATE 20 MG PO TABS: 40.0000 mg | ORAL_TABLET | Freq: Every day | ORAL | Status: DC

## 2013-03-21 MED ORDER — CHLORTHALIDONE 50 MG PO TABS: 50.0000 mg | ORAL_TABLET | Freq: Every day | ORAL | Status: DC

## 2013-03-21 NOTE — Assessment & Plan Note (Signed)
Elevated today, pt only taking chlorthalidone 25- sent in new Rx for 50mg .  Cont metoprolol and enalapril.  Pt will call with BPs next week to ensure these are WNL.  If still elevated, will increase chlorthalidone to 100mg . BMET 11/2012 was normal.

## 2013-03-21 NOTE — Progress Notes (Signed)
S: Pt comes in today for follow up.  HYPERTENSION BP: 165/84; reports better readings at home 145/65, 137/? Meds: enalapril 40, metorpolol 50 BID, chlorthalidone 50 Taking meds: Yes - did not increase chlorthalidone to 50 (only taking 25) and did not take meds this AM, ate ham yesterday    # of doses missed/week: 1 Symptoms: Headache: No Dizziness: No Vision changes: No SOB:  No Chest pain: No LE swelling: No Tobacco use: No   DIABETES Home CBGs: Meds: metformin 500 BID Taking Meds: yes # of doses missed per week: 0 Hypoglycemic episodes?: no Last foot exam:  Last eye exam: TODAY Symptoms: Polyuria: no Polydipsia: no Parasthesias: no   Dizziness: no  Nausea:  no Vomiting:  no Last A1c:  Lab Results  Component Value Date   HGBA1C 6.4 03/21/2013      ROS: Per HPI  History  Smoking status  . Former Smoker  Smokeless tobacco  . Never Used    O:  Filed Vitals:   03/21/13 0956  BP: 165/84  Pulse: 70    Gen: NAD CV: RRR, no murmur Pulm: CTA bilat, no wheezes or crackles Ext: Warm, no edema   A/P: 57 y.o. female p/w HTN, DM -See problem list -f/u in 3 months

## 2013-03-21 NOTE — Assessment & Plan Note (Addendum)
A1c 6.4 Cont met 500 BID. F/u 3 months. Eye exam done today.

## 2013-03-21 NOTE — Patient Instructions (Signed)
It was good to see you today.  Your blood pressure is a little high-- pick up your new chlorthalidone prescription. Then, check your blood pressure a few times next week and CALL ME with those numbers-- you can just leave a message with the front staff or my nurses.  Your diabetes is great, so keep taking the metformin!  Come back in 3 months for your next A1c and to meet your new doctor!

## 2013-03-28 NOTE — Telephone Encounter (Signed)
Patient  was calling in her BP check. 125 over 71 JW

## 2013-03-28 NOTE — Telephone Encounter (Signed)
Perfect, no need to change any medicines!  Follow up as previously discussed. Thanks.

## 2013-03-28 NOTE — Telephone Encounter (Signed)
Will fwd to Md.  Montavius Subramaniam L, CMA  

## 2013-04-09 ENCOUNTER — Encounter: Payer: Self-pay | Admitting: Family Medicine

## 2013-04-09 ENCOUNTER — Other Ambulatory Visit: Payer: Self-pay | Admitting: Family Medicine

## 2013-04-12 ENCOUNTER — Other Ambulatory Visit: Payer: Self-pay

## 2013-05-16 ENCOUNTER — Other Ambulatory Visit: Payer: Self-pay | Admitting: *Deleted

## 2013-05-16 MED ORDER — METOPROLOL TARTRATE 25 MG PO TABS: 25.0000 mg | ORAL_TABLET | Freq: Two times a day (BID) | ORAL | Status: DC

## 2013-05-16 MED ORDER — MELOXICAM 7.5 MG PO TABS: 7.5000 mg | ORAL_TABLET | Freq: Every day | ORAL | Status: DC

## 2013-05-18 ENCOUNTER — Telehealth: Payer: Self-pay | Admitting: Family Medicine

## 2013-05-18 MED ORDER — METOPROLOL TARTRATE 25 MG PO TABS: 50.0000 mg | ORAL_TABLET | Freq: Two times a day (BID) | ORAL | Status: DC

## 2013-05-18 NOTE — Telephone Encounter (Signed)
Patient is right. Upon reviewing her medications, she has been taking metoprolol 25mg  2 tablets twice a day.  I will send a new prescription to the pharmacy for when she runs out of the prescription she just filled.   Marena Chancy, PGY-3 Family Medicine Resident

## 2013-05-18 NOTE — Telephone Encounter (Signed)
Forward to PCP to clarify directions.[name], Rodena Medin

## 2013-05-18 NOTE — Addendum Note (Signed)
Addended by: Lonia Skinner on: 05/18/2013 01:58 PM   Modules accepted: Orders

## 2013-05-18 NOTE — Telephone Encounter (Signed)
Pt got rx for metroprol. It says to take 1 tablet twice a day. Previous prescription said 2 tablets twice a day. She is wondering if this is correct and why the change Please advise

## 2013-05-18 NOTE — Telephone Encounter (Signed)
Called patient and gave her the message.[name], Rodena Medin

## 2013-06-11 ENCOUNTER — Telehealth: Payer: Self-pay | Admitting: Family Medicine

## 2013-06-11 MED ORDER — MELOXICAM 7.5 MG PO TABS: 7.5000 mg | ORAL_TABLET | Freq: Two times a day (BID) | ORAL | Status: DC

## 2013-06-11 NOTE — Telephone Encounter (Signed)
Pt states she has contacted [facility] on [address] for refill on meloxicam.  She says the pharmacy states they are waiting for the dr to call in the refill. No documentation showing contact from Pharmacy. She also wants to find out why dosage was changed. Please advise. She called the pharmacy on Wed last week and again this morning

## 2013-06-11 NOTE — Telephone Encounter (Signed)
Will fwd. To PCP for review .[name]  

## 2013-06-11 NOTE — Telephone Encounter (Signed)
Will refill meloxicam 7.5mg  bid. I don't see another dose that was prescribed. Patient should make appointment to be seen for her next refills since I have never met her.   [name], PGY-3 Family Medicine Resident

## 2013-06-11 NOTE — Telephone Encounter (Signed)
Called pt. Advised. Pt agreed and will sched. OV with Dr.Losq. Lorenda Hatchet, Renato Battles

## 2013-07-16 ENCOUNTER — Telehealth: Payer: Self-pay | Admitting: Family Medicine

## 2013-07-16 NOTE — Telephone Encounter (Signed)
Forward to PCP for request.[name], [name]  

## 2013-07-16 NOTE — Telephone Encounter (Signed)
Refill request for Meloxicam. Patient has scheduled appt on 10/20 but will out of this med before then.

## 2013-07-17 MED ORDER — MELOXICAM 7.5 MG PO TABS: 7.5000 mg | ORAL_TABLET | Freq: Two times a day (BID) | ORAL | Status: DC

## 2013-07-17 NOTE — Telephone Encounter (Signed)
refilled mobic  Marena Chancy, PGY-3 Family Medicine Resident

## 2013-07-23 ENCOUNTER — Encounter: Payer: Self-pay | Admitting: Family Medicine

## 2013-07-23 ENCOUNTER — Ambulatory Visit (INDEPENDENT_AMBULATORY_CARE_PROVIDER_SITE_OTHER): Payer: 59 | Admitting: Family Medicine

## 2013-07-23 VITALS — BP 140/76 | HR 63 | Temp 98.3°F | Ht 64.0 in | Wt 264.4 lb

## 2013-07-23 DIAGNOSIS — E119 Type 2 diabetes mellitus without complications: Secondary | ICD-10-CM

## 2013-07-23 DIAGNOSIS — Z23 Encounter for immunization: Secondary | ICD-10-CM

## 2013-07-23 DIAGNOSIS — M25569 Pain in unspecified knee: Secondary | ICD-10-CM

## 2013-07-23 DIAGNOSIS — I10 Essential (primary) hypertension: Secondary | ICD-10-CM

## 2013-07-23 DIAGNOSIS — M25561 Pain in right knee: Secondary | ICD-10-CM

## 2013-07-23 DIAGNOSIS — M25579 Pain in unspecified ankle and joints of unspecified foot: Secondary | ICD-10-CM

## 2013-07-23 DIAGNOSIS — E669 Obesity, unspecified: Secondary | ICD-10-CM

## 2013-07-23 DIAGNOSIS — E7800 Pure hypercholesterolemia, unspecified: Secondary | ICD-10-CM

## 2013-07-23 LAB — POCT GLYCOSYLATED HEMOGLOBIN (HGB A1C): Hemoglobin A1C: 6.7

## 2013-07-23 LAB — COMPREHENSIVE METABOLIC PANEL
ALT: 14 U/L (ref 0–35)
AST: 18 U/L (ref 0–37)
Albumin: 4.3 g/dL (ref 3.5–5.2)
CO2: 30 mEq/L (ref 19–32)
Chloride: 100 mEq/L (ref 96–112)
Glucose, Bld: 196 mg/dL — ABNORMAL HIGH (ref 70–99)
Potassium: 3.7 mEq/L (ref 3.5–5.3)
Sodium: 139 mEq/L (ref 135–145)
Total Bilirubin: 0.5 mg/dL (ref 0.3–1.2)
Total Protein: 7.2 g/dL (ref 6.0–8.3)

## 2013-07-23 LAB — LIPID PANEL: LDL Cholesterol: 160 mg/dL — ABNORMAL HIGH (ref 0–99)

## 2013-07-23 MED ORDER — MELOXICAM 7.5 MG PO TABS: 7.5000 mg | ORAL_TABLET | Freq: Two times a day (BID) | ORAL | Status: DC

## 2013-07-23 MED ORDER — ROSUVASTATIN CALCIUM 10 MG PO TABS: 10.0000 mg | ORAL_TABLET | Freq: Every day | ORAL | Status: DC

## 2013-07-23 MED ORDER — METFORMIN HCL 500 MG PO TABS: 500.0000 mg | ORAL_TABLET | Freq: Two times a day (BID) | ORAL | Status: DC

## 2013-07-23 MED ORDER — METOPROLOL TARTRATE 50 MG PO TABS: 50.0000 mg | ORAL_TABLET | Freq: Two times a day (BID) | ORAL | Status: DC

## 2013-07-23 MED ORDER — FLUTICASONE PROPIONATE 50 MCG/ACT NA SUSP: 1.0000 | Freq: Every day | NASAL | Status: DC

## 2013-07-23 NOTE — Patient Instructions (Signed)
Continue working on diet and exercise and I will see you back in 3 months for follow up in diabetes.   I will call you if we need to discuss anything about the labs.

## 2013-07-24 DIAGNOSIS — M25579 Pain in unspecified ankle and joints of unspecified foot: Secondary | ICD-10-CM | POA: Insufficient documentation

## 2013-07-24 NOTE — Progress Notes (Signed)
Patient ID: [name]    DOB: [birth year], 57 y.o.   MRN: [id number] --- Subjective:  [name] is a 57 y.o.female who presents for medication refills, follow up on diabetes. Concern includes the following:  - foot pain on top of feet, left more than right. Not associated with swelling. Ongoing for a few weeks. She noticed that it's worst with wearing high heels.   - HTN: BP's at home in 130's/60's. Compliant with medications. Doses missed in 1 week: none No chest pain, no shortness of breath, occasional lower extremity swelling bilateral, better with elevation and worst at the end of the day.   - Type 2 diabetes: compliant with metformin 500mg  bid, no missed doses in 1 week. No hypoglycemia. No lower extremity tingling and numbness.   -HLD: crestor: takes it 3 days a week because it makes her sleepy.  Health care maintenance:  - mammogram 2 years ago - colonoscopy: 10 years ago    ROS: see HPI Past Medical History: reviewed and updated medications and allergies. Social History: Tobacco: none currently  Objective: Filed Vitals:   07/23/13 0945  BP: 140/76  Pulse: 63  Temp: 98.3 F (36.8 C)    Physical Examination:   General appearance - alert, well appearing, and in no distress Neck - supple, no significant adenopathy Chest - clear to auscultation, no wheezes, rales or rhonchi, symmetric air entry Heart - normal rate, regular rhythm, normal S1, S2, no murmurs, rubs, clicks or gallops Abdomen - soft, nontender, nondistended, no masses or organomegaly Extremities - non pitting edema, sensation to light touch intact, dorsalis pedis pulses present bilaterally, no tenderness to palpation along feet bilaterally,pes planus.

## 2013-07-24 NOTE — Assessment & Plan Note (Signed)
In range for diabetes. Continue enalapril, metoprolol. cmet today

## 2013-07-24 NOTE — Assessment & Plan Note (Signed)
A1C of 6.7 from 6.4 previously. Gave patient choice between starting higher dose of metformin or working on diet and exercise and checking it back in 3 months. Patient would like to focus on diet and exercise before increasing medication. If not better in 3 months, will increase metformin to 1000mg  bid.  Obtain lipid panel and CMP today.

## 2013-07-24 NOTE — Assessment & Plan Note (Signed)
Refilled mobic which helps her with bilateral knee pain

## 2013-07-24 NOTE — Assessment & Plan Note (Signed)
Bilateral foot pain, non reproducible on exam. Likely from flat feet and high heels. Recommended shoes with arch support. If this doesn't work, will recommend off the shelf orthotics.

## 2013-08-03 ENCOUNTER — Telehealth: Payer: Self-pay | Admitting: Family Medicine

## 2013-08-03 NOTE — Telephone Encounter (Signed)
Called pt and told her we do not have coupons.  Pt wants MD to know that it will be a while before she can take the meds because its too expensive.  Lashaunda Schild, Darlyne Russian, CMA

## 2013-08-03 NOTE — Telephone Encounter (Signed)
Called patient back to see if there would be other options for statins. Turns out, crestor is the only statin she tolerates.  She will need to wait and see if she can get it through the MAP program at this point.   Marena Chancy, PGY-3 Family Medicine Resident

## 2013-08-03 NOTE — Telephone Encounter (Signed)
Pt called and wanted to know if we had any coupons for Crestor . She said it is very expensive. JW

## 2013-09-17 ENCOUNTER — Other Ambulatory Visit: Payer: Self-pay | Admitting: Family Medicine

## 2013-10-19 ENCOUNTER — Other Ambulatory Visit: Payer: Self-pay

## 2013-10-19 DIAGNOSIS — Z1231 Encounter for screening mammogram for malignant neoplasm of breast: Secondary | ICD-10-CM

## 2013-10-20 ENCOUNTER — Other Ambulatory Visit: Payer: Self-pay | Admitting: Family Medicine

## 2013-11-16 ENCOUNTER — Other Ambulatory Visit: Payer: Self-pay | Admitting: *Deleted

## 2013-11-16 ENCOUNTER — Ambulatory Visit: Payer: 59

## 2013-11-16 MED ORDER — ENALAPRIL MALEATE 20 MG PO TABS: 20.0000 mg | ORAL_TABLET | Freq: Every day | ORAL | Status: DC

## 2013-11-16 MED ORDER — MELOXICAM 7.5 MG PO TABS: 7.5000 mg | ORAL_TABLET | Freq: Two times a day (BID) | ORAL | Status: DC

## 2013-11-16 MED ORDER — METOPROLOL TARTRATE 50 MG PO TABS: 50.0000 mg | ORAL_TABLET | Freq: Two times a day (BID) | ORAL | Status: DC

## 2013-11-26 ENCOUNTER — Telehealth: Payer: Self-pay | Admitting: Family Medicine

## 2013-11-26 NOTE — Telephone Encounter (Signed)
Pt called and needs clarification on her medication Enalapril. She has always taken 20 mg twice a day. Now her new prescription is 20 mg once a day. She wasn't aware that it was changing and would like the doctor to call her. jw

## 2013-11-26 NOTE — Telephone Encounter (Signed)
Pt has been taking Vasotec 20 mg two tablets once a day.  On 11/16/2013, Rx was called in as one tablet once a day.  I called pharmacy and and clarified that patient is to be taking two tablets once a day.  Called patient and let her know and also reminded patient that she needs an OV.  Taichi Repka, Loralyn Freshwater, [place]

## 2013-11-28 ENCOUNTER — Ambulatory Visit
Admission: RE | Admit: 2013-11-28 | Discharge: 2013-11-28 | Disposition: A | Discharge status: Home / Self Care | Payer: No Typology Code available for payment source | Source: Ambulatory Visit

## 2013-11-28 DIAGNOSIS — Z1231 Encounter for screening mammogram for malignant neoplasm of breast: Secondary | ICD-10-CM

## 2014-01-01 ENCOUNTER — Ambulatory Visit (INDEPENDENT_AMBULATORY_CARE_PROVIDER_SITE_OTHER): Payer: No Typology Code available for payment source | Admitting: Family Medicine

## 2014-01-01 VITALS — BP 150/64 | HR 79 | Temp 98.1°F | Ht 63.5 in | Wt 271.0 lb

## 2014-01-01 DIAGNOSIS — J329 Chronic sinusitis, unspecified: Secondary | ICD-10-CM

## 2014-01-01 DIAGNOSIS — E7800 Pure hypercholesterolemia, unspecified: Secondary | ICD-10-CM

## 2014-01-01 DIAGNOSIS — H109 Unspecified conjunctivitis: Secondary | ICD-10-CM

## 2014-01-01 DIAGNOSIS — E119 Type 2 diabetes mellitus without complications: Secondary | ICD-10-CM

## 2014-01-01 DIAGNOSIS — I10 Essential (primary) hypertension: Secondary | ICD-10-CM

## 2014-01-01 LAB — POCT GLYCOSYLATED HEMOGLOBIN (HGB A1C): Hemoglobin A1C: 7

## 2014-01-01 MED ORDER — ENALAPRIL MALEATE 20 MG PO TABS: 20.0000 mg | ORAL_TABLET | Freq: Two times a day (BID) | ORAL | Status: DC

## 2014-01-01 MED ORDER — AZITHROMYCIN 250 MG PO TABS: ORAL_TABLET | ORAL | Status: DC

## 2014-01-01 MED ORDER — ROSUVASTATIN CALCIUM 10 MG PO TABS: 10.0000 mg | ORAL_TABLET | Freq: Every day | ORAL | Status: DC

## 2014-01-01 MED ORDER — ERYTHROMYCIN 5 MG/GM OP OINT: 1.0000 "application " | TOPICAL_OINTMENT | Freq: Every day | OPHTHALMIC | Status: DC

## 2014-01-03 DIAGNOSIS — H109 Unspecified conjunctivitis: Secondary | ICD-10-CM | POA: Insufficient documentation

## 2014-01-03 DIAGNOSIS — J329 Chronic sinusitis, unspecified: Secondary | ICD-10-CM | POA: Insufficient documentation

## 2014-01-03 NOTE — Assessment & Plan Note (Signed)
Given duration of 1 week or more and continued sinus pressure and drainage, will treat for bacterial sinusitis. Patient allergic to amoxicillin and therefore will use azithromycin.

## 2014-01-03 NOTE — Progress Notes (Signed)
Patient ID: [name]    DOB: Mar 09, 1956, 58 y.o.   MRN: [id number] --- Subjective:  [name] is a 58 y.o.female who presents for follow up on diabetes. Her additional concern includes sinus pressure and pain for over 1 week. She also reports symptoms of pink eye.   # Diabetes: - medications: metformin 500mg  bid - missed doses in 1 week:0 - hypoglycemic events: none - CBG's at home:doesn't check regularly - changes in vision? none - numbness or tingling in lower extremities? Right leg gets numb with tingling but this may be related to her back.  - sores or ulcers? none - seen by podiatry?no - last eye check? 12/31/13 - diet: eats out a lot - exercise: doesn't exercise regularly but has a "gazelle" exercising machine  # sinus pressure and headache: with green and bloody mucous discharge. Frontal sinus headache. No fevers. Mild cough. No sick contacts.   # Pink eye: was seen by eye doctor 3-4 days prior to office visit and fitted with contacts. After wearing contacts for a day or 2, she started having watering and redness of the left eye. She returned to the eye doctor the day prior to this office visit and was told she had conjunctivitis for which they wanted to treat her with antibiotic ointment. She said that since she was going to her doctor's the next day, she would get the medicine there.  She feels like redness and watering is improving. She denies any change in vision. No feeling of sand in the eye. Not wearing contacts anymore.    ROS: see HPI Past Medical History: reviewed and updated medications and allergies. Social History: Tobacco: former smoker  Objective: Filed Vitals:   01/01/14 1646  BP: 150/64  Pulse: 79  Temp: 98.1 F (36.7 C)   Manual repeat: 140/70  Physical Examination:   General appearance - alert, well appearing, and in no distress Ears - bilateral TM's and external ear canals normal Nose - erythematous and congested nasal turbinates with maxillary  sinus and frontal sinus tenderness Mouth - mucous membranes moist, pharynx normal without lesions Eyes - mildly erythematous conjunctiva in left eye, no drainage.  Chest - clear to auscultation, no wheezes, rales or rhonchi, symmetric air entry Heart - normal rate, regular rhythm, normal S1, S2, no murmurs Extremities - normal dorsalis pedis pulse, no sores or ulcers, thickened and dystrophic nails on great toes.

## 2014-01-03 NOTE — Assessment & Plan Note (Signed)
Patient has not been on any statin therapy due to difficulty tolerating it.  She is willing to try crestor.  - crestor 10mg . Start 3 times a week to allow for tolerability and titrate up as possible.  - repeat lipid panel in 3-4 months

## 2014-01-03 NOTE — Assessment & Plan Note (Signed)
A1C of 7, increased from previous.  Patient wanting to work on diet and exercise before increasing metformin dose.  - exercise on "gazelle" home exercising machine every other morning - eat out less - repeat A1C in 3 months - podiatry referral placed. Patient has likely onychomycosis which she can be evaluated for when she is seen by podiatry.

## 2014-01-03 NOTE — Assessment & Plan Note (Addendum)
Very mild erythematous conjunctiva on exam but since she had been evaluated and diagnosed with conjunctivitis the day prior by her eye doctor with recommendation for antibiotic treatment, will treat with erythromycin ointment.

## 2014-01-03 NOTE — Assessment & Plan Note (Signed)
Repeat BP better.  Continue current meds.  Exercise and diet If not better in 3 months, will adjust meds.

## 2014-01-28 ENCOUNTER — Encounter: Payer: Self-pay | Admitting: Podiatry

## 2014-01-28 ENCOUNTER — Ambulatory Visit (INDEPENDENT_AMBULATORY_CARE_PROVIDER_SITE_OTHER): Payer: No Typology Code available for payment source | Admitting: Podiatry

## 2014-01-28 VITALS — Ht 63.5 in | Wt 270.0 lb

## 2014-01-28 DIAGNOSIS — M79609 Pain in unspecified limb: Secondary | ICD-10-CM

## 2014-01-28 DIAGNOSIS — B351 Tinea unguium: Secondary | ICD-10-CM

## 2014-01-28 NOTE — Progress Notes (Signed)
   Subjective:    Patient ID: [name], female    DOB: November 05, 1955, 58 y.o.   MRN: [id number]  HPI Comments: Pt presents for debridement and diabetic foot exam.     Review of Systems  Allergic/Immunologic: Positive for environmental allergies.  All other systems reviewed and are negative.      Objective:   Physical Exam Orientated x48 58 year old black female  Vascular: DP and PT pulses 2/4 bilaterally.  Neurological: Sensation to 10 g monofilament wire intact 5/5 bilaterally. Vibratory sensation intact bilaterally. Ankle reflexes reactive bilaterally.  Dermatological: Incurvated, discolored, elongated, hypertrophic toenails x10  Musculoskeletal: No deformities noted bilaterally. No restriction ankle, subtalar, midtarsal joints bilaterally       Assessment & Plan:  Assessment: Diabetic without complications Symptomatic onychomycoses x10  Plan: Nails x10 are debrided without a bleeding. Reappoint at three-month intervals.

## 2014-01-28 NOTE — Patient Instructions (Signed)
Diabetes and Foot Care Diabetes may cause you to have problems because of poor blood supply (circulation) to your feet and legs. This may cause the skin on your feet to become thinner, break easier, and heal more slowly. Your skin may become dry, and the skin may peel and crack. You may also have nerve damage in your legs and feet causing decreased feeling in them. You may not notice minor injuries to your feet that could lead to infections or more serious problems. Taking care of your feet is one of the most important things you can do for yourself.  HOME CARE INSTRUCTIONS  Wear shoes at all times, even in the house. Do not go barefoot. Bare feet are easily injured.  Check your feet daily for blisters, cuts, and redness. If you cannot see the bottom of your feet, use a mirror or ask someone for help.  Wash your feet with warm water (do not use hot water) and mild soap. Then pat your feet and the areas between your toes until they are completely dry. Do not soak your feet as this can dry your skin.  Apply a moisturizing lotion or petroleum jelly (that does not contain alcohol and is unscented) to the skin on your feet and to dry, brittle toenails. Do not apply lotion between your toes.  Trim your toenails straight across. Do not dig under them or around the cuticle. File the edges of your nails with an emery board or nail file.  Do not cut corns or calluses or try to remove them with medicine.  Wear clean socks or stockings every day. Make sure they are not too tight. Do not wear knee-high stockings since they may decrease blood flow to your legs.  Wear shoes that fit properly and have enough cushioning. To break in new shoes, wear them for just a few hours a day. This prevents you from injuring your feet. Always look in your shoes before you put them on to be sure there are no objects inside.  Do not cross your legs. This may decrease the blood flow to your feet.  If you find a minor scrape,  cut, or break in the skin on your feet, keep it and the skin around it clean and dry. These areas may be cleansed with mild soap and water. Do not cleanse the area with peroxide, alcohol, or iodine.  When you remove an adhesive bandage, be sure not to damage the skin around it.  If you have a wound, look at it several times a day to make sure it is healing.  Do not use heating pads or hot water bottles. They may burn your skin. If you have lost feeling in your feet or legs, you may not know it is happening until it is too late.  Make sure your health care provider performs a complete foot exam at least annually or more often if you have foot problems. Report any cuts, sores, or bruises to your health care provider immediately. SEEK MEDICAL CARE IF:   You have an injury that is not healing.  You have cuts or breaks in the skin.  You have an ingrown nail.  You notice redness on your legs or feet.  You feel burning or tingling in your legs or feet.  You have pain or cramps in your legs and feet.  Your legs or feet are numb.  Your feet always feel cold. SEEK IMMEDIATE MEDICAL CARE IF:   There is increasing redness,   swelling, or pain in or around a wound.  There is a red line that goes up your leg.  Pus is coming from a wound.  You develop a fever or as directed by your health care provider.  You notice a bad smell coming from an ulcer or wound. Document Released: 09/17/2000 Document Revised: 05/23/2013 Document Reviewed: 02/27/2013 [employer] Patient Information 2014 [employer].  

## 2014-02-06 ENCOUNTER — Encounter: Payer: Self-pay | Admitting: Emergency Medicine

## 2014-02-06 ENCOUNTER — Ambulatory Visit (INDEPENDENT_AMBULATORY_CARE_PROVIDER_SITE_OTHER): Payer: No Typology Code available for payment source | Admitting: Emergency Medicine

## 2014-02-06 VITALS — BP 135/76 | HR 80 | Ht 63.5 in | Wt 271.0 lb

## 2014-02-06 DIAGNOSIS — M5431 Sciatica, right side: Secondary | ICD-10-CM | POA: Insufficient documentation

## 2014-02-06 DIAGNOSIS — M5430 Sciatica, unspecified side: Secondary | ICD-10-CM

## 2014-02-06 MED ORDER — GABAPENTIN 300 MG PO CAPS: 300.0000 mg | ORAL_CAPSULE | Freq: Every day | ORAL | Status: DC

## 2014-02-06 MED ORDER — CYCLOBENZAPRINE HCL 5 MG PO TABS: 5.0000 mg | ORAL_TABLET | Freq: Every day | ORAL | Status: DC

## 2014-02-06 NOTE — Progress Notes (Signed)
   Subjective:    Patient ID: [name], female    DOB: 1956-09-11, 58 y.o.   MRN: [id number]  HPI [name] is here for a same-day appointment for right leg pain.  She states this pain started about one week ago, but got acutely worse 2 days ago. It starts kind of in her right buttock and goes down the outside of her leg all the way to her foot. She is unable to describe it, she states that it is just there. Associated with numbness of the lateral leg. No leg swelling or weakness. No fevers or redness. Denies any back pain. No bowel or bladder incontinence.  Current Outpatient Prescriptions on File Prior to Visit  Medication Sig Dispense Refill  . azithromycin (ZITHROMAX) 250 MG tablet Take 500mg  on day 1 and 250mg  daily from days 2-5  6 tablet  0  . chlorthalidone (HYGROTON) 50 MG tablet TAKE ONE TABLET BY MOUTH ONCE DAILY  30 tablet  0  . enalapril (VASOTEC) 20 MG tablet Take 1 tablet (20 mg total) by mouth 2 (two) times daily.  60 tablet  4  . erythromycin (ROMYCIN) ophthalmic ointment Place 1 application into both eyes at bedtime. 1/2 inch on bottom of lid  3.5 g  0  . fluticasone (FLONASE) 50 MCG/ACT nasal spray Place 1 spray into the nose daily.  16 g  5  . meloxicam (MOBIC) 7.5 MG tablet Take 1 tablet (7.5 mg total) by mouth 2 (two) times daily.  60 tablet  2  . metFORMIN (GLUCOPHAGE) 500 MG tablet Take 1 tablet (500 mg total) by mouth 2 (two) times daily with a meal.  60 tablet  6  . metoprolol (LOPRESSOR) 50 MG tablet Take 1 tablet (50 mg total) by mouth 2 (two) times daily.  60 tablet  3  . omeprazole (PRILOSEC) 40 MG capsule Take 1 capsule (40 mg total) by mouth daily.  30 capsule  11  . rosuvastatin (CRESTOR) 10 MG tablet Take 1 tablet (10 mg total) by mouth daily.  90 tablet  3  . traZODone (DESYREL) 50 MG tablet 1-2 tabs by mouth at bedtime as needed for sleep        No current facility-administered medications on file prior to visit.    I have reviewed and updated the  following as appropriate: allergies and current medications SHx: former smoker   Review of Systems See HPI    Objective:   Physical Exam BP 135/76  Pulse 80  Ht 5' 3.5" (1.613 m)  Wt 271 lb (122.925 kg)  BMI 47.25 kg/m2 Gen: alert, cooperative, mild distress with exam Back: no erythema or edema; no vertebral tenderness or step-offs; no SI joint tenderness; tender over right piriformis; seated SLR + on right Right leg: no erythema or edema; mild tenderness along lateral aspect; no weakness; 2+ DP pulse; no calf tenderness      Assessment & Plan:

## 2014-02-06 NOTE — Patient Instructions (Signed)
It was nice to meet you!  You have sciatica. Take flexeril and gabapentin at bedtime. Start doing the exercises in a few days.  If it is not any better in 1 week, let us know as the next step is physical therapy.  Sciatica Sciatica is pain, weakness, numbness, or tingling along your sciatic nerve. The nerve starts in the lower back and runs down the back of each leg. Nerve damage or certain conditions pinch or put pressure on the sciatic nerve. This causes the pain, weakness, and other discomforts of sciatica. HOME CARE   Only take medicine as told by your doctor.  Apply ice to the affected area for 20 minutes. Do this 3 4 times a day for the first 48 72 hours. Then try heat in the same way.  Exercise, stretch, or do your usual activities if these do not make your pain worse.  Go to physical therapy as told by your doctor.  Keep all doctor visits as told.  Do not wear high heels or shoes that are not supportive.  Get a firm mattress if your mattress is too soft to lessen pain and discomfort. GET HELP RIGHT AWAY IF:   You cannot control when you poop (bowel movement) or pee (urinate).  You have more weakness in your lower back, lower belly (pelvis), butt (buttocks), or legs.  You have redness or puffiness (swelling) of your back.  You have a burning feeling when you pee.  You have pain that gets worse when you lie down.  You have pain that wakes you from your sleep.  Your pain is worse than past pain.  Your pain lasts longer than 4 weeks.  You are suddenly losing weight without reason. MAKE SURE YOU:   Understand these instructions.  Will watch this condition.  Will get help right away if you are not doing well or get worse. Document Released: 06/29/2008 Document Revised: 03/21/2012 Document Reviewed: 01/30/2012 [facility] Patient Information 2014 [place].

## 2014-02-06 NOTE — Assessment & Plan Note (Signed)
Likely coming from piriformis. Continue meloxicam. Start flexeril and gabapentin at night. Handout on piriformis stretches given. If no improvement in 1-2 weeks, would refer to physical therapy.

## 2014-02-12 ENCOUNTER — Other Ambulatory Visit: Payer: Self-pay | Admitting: Family Medicine

## 2014-03-16 ENCOUNTER — Other Ambulatory Visit: Payer: Self-pay | Admitting: Family Medicine

## 2014-03-18 ENCOUNTER — Other Ambulatory Visit: Payer: Self-pay | Admitting: *Deleted

## 2014-03-18 MED ORDER — CHLORTHALIDONE 50 MG PO TABS: ORAL_TABLET | ORAL | Status: DC

## 2014-04-23 ENCOUNTER — Other Ambulatory Visit: Payer: Self-pay | Admitting: *Deleted

## 2014-04-25 ENCOUNTER — Telehealth: Payer: Self-pay | Admitting: Family Medicine

## 2014-04-25 ENCOUNTER — Other Ambulatory Visit: Payer: Self-pay | Admitting: *Deleted

## 2014-04-25 NOTE — Telephone Encounter (Signed)
Pt called because the pharmacy has been faxing [place] refill request for 8 days and we have not responded. Can someone check into this . jw

## 2014-04-26 NOTE — Telephone Encounter (Signed)
2 pending refill requests on mobic. Will forward to MD. Kennon Holter, Cyril Woodmansee C

## 2014-04-28 MED ORDER — METOPROLOL TARTRATE 50 MG PO TABS: ORAL_TABLET | ORAL | Status: DC

## 2014-04-28 MED ORDER — MELOXICAM 7.5 MG PO TABS: ORAL_TABLET | ORAL | Status: DC

## 2014-04-28 NOTE — Telephone Encounter (Signed)
I have refilled pts metoprolol and Mobic. Pt will be covered for one month but I will be insisting that pts sees me before I refill any more of her Mobic. I do not see how long pt has been on this but long-term use is not ideal.   -Georges Lynch, MD

## 2014-04-28 NOTE — Telephone Encounter (Signed)
I refilled Mobic and Metoprolol for pt. She does not have a scheduled appt w/ me. I am not aware of how long she has been on Mobic but I do not feel comfortable with any more Mobic refills w/o first seeing her. Please ask that she makes an appt w/ me in the next month.  Thank you!

## 2014-04-29 ENCOUNTER — Ambulatory Visit: Payer: No Typology Code available for payment source | Admitting: Podiatry

## 2014-04-29 NOTE — Telephone Encounter (Signed)
Left message to return call. Please tell patient her scripts were sent in but she will need to schedule an appointment with her new PCP for any further refills.[name], Kevin Fenton

## 2014-04-29 NOTE — Telephone Encounter (Signed)
Left voice message for pt to call and schedule medication follow up.  Only 30 day supply of medication was filled. Derl Barrow, RN

## 2014-05-08 ENCOUNTER — Encounter: Payer: Self-pay | Admitting: Family Medicine

## 2014-05-08 DIAGNOSIS — M25579 Pain in unspecified ankle and joints of unspecified foot: Secondary | ICD-10-CM

## 2014-05-09 NOTE — Telephone Encounter (Signed)
Spoke with patient and insurance regarding podiatry referral and her plan doesn't require PA.  They also state that her last bill from Dr. Phoebe Perch was for $345.  She was billed for $180 for debriding.  This is not covered under plan, but diabetic foot care is.  Informed patient of this and she is aware.  She is also aware to direct any more questions regarding podiatry bills to their office.  Jazmin Hartsell,CMA

## 2014-05-09 NOTE — Telephone Encounter (Signed)
Referral placed to podiatry.  Algis Greenhouse. Jerline Pain, [place] Resident PGY-1

## 2014-05-09 NOTE — Telephone Encounter (Signed)
Message copied by Valerie Roys on Thu May 09, 2014  2:25 PM ------      Message from: Derl Barrow      Created: Thu May 09, 2014 10:20 AM      Regarding: Referral to Podiatry       Pt called stating she has a follow up appt on 05/27/2014 with Podiatry. She said they were billing because the insurance information was not sent in on time.  Please give her a call [phone].  Derl Barrow, RN       ------

## 2014-05-20 ENCOUNTER — Telehealth: Payer: Self-pay | Admitting: Family Medicine

## 2014-05-20 MED ORDER — MELOXICAM 7.5 MG PO TABS: ORAL_TABLET | ORAL | Status: DC

## 2014-05-20 NOTE — Telephone Encounter (Signed)
Refill request for Meloxicam

## 2014-05-21 ENCOUNTER — Other Ambulatory Visit: Payer: Self-pay | Admitting: *Deleted

## 2014-05-22 MED ORDER — CHLORTHALIDONE 50 MG PO TABS: ORAL_TABLET | ORAL | Status: DC

## 2014-05-22 NOTE — Telephone Encounter (Signed)
Refill for hygroton placed.

## 2014-05-27 ENCOUNTER — Ambulatory Visit: Payer: No Typology Code available for payment source | Admitting: Podiatry

## 2014-05-27 ENCOUNTER — Other Ambulatory Visit: Payer: Self-pay | Admitting: *Deleted

## 2014-05-28 MED ORDER — CHLORTHALIDONE 50 MG PO TABS: ORAL_TABLET | ORAL | Status: DC

## 2014-05-28 NOTE — Telephone Encounter (Signed)
Hygroton script sent in.

## 2014-06-03 ENCOUNTER — Encounter: Payer: Self-pay | Admitting: Family Medicine

## 2014-06-03 ENCOUNTER — Ambulatory Visit (INDEPENDENT_AMBULATORY_CARE_PROVIDER_SITE_OTHER): Payer: No Typology Code available for payment source | Admitting: Family Medicine

## 2014-06-03 VITALS — BP 161/74 | HR 70 | Temp 98.0°F | Wt 274.0 lb

## 2014-06-03 DIAGNOSIS — M25562 Pain in left knee: Secondary | ICD-10-CM

## 2014-06-03 DIAGNOSIS — E669 Obesity, unspecified: Secondary | ICD-10-CM

## 2014-06-03 DIAGNOSIS — E119 Type 2 diabetes mellitus without complications: Secondary | ICD-10-CM

## 2014-06-03 DIAGNOSIS — M25561 Pain in right knee: Secondary | ICD-10-CM

## 2014-06-03 DIAGNOSIS — E7800 Pure hypercholesterolemia, unspecified: Secondary | ICD-10-CM

## 2014-06-03 DIAGNOSIS — K219 Gastro-esophageal reflux disease without esophagitis: Secondary | ICD-10-CM

## 2014-06-03 DIAGNOSIS — M25569 Pain in unspecified knee: Secondary | ICD-10-CM

## 2014-06-03 LAB — POCT GLYCOSYLATED HEMOGLOBIN (HGB A1C): Hemoglobin A1C: 7.6

## 2014-06-03 MED ORDER — TRAZODONE HCL 50 MG PO TABS: 50.0000 mg | ORAL_TABLET | Freq: Every evening | ORAL | Status: DC | PRN

## 2014-06-03 MED ORDER — OMEPRAZOLE 40 MG PO CPDR: 40.0000 mg | DELAYED_RELEASE_CAPSULE | Freq: Every day | ORAL | Status: DC

## 2014-06-03 MED ORDER — ENALAPRIL MALEATE 20 MG PO TABS: 20.0000 mg | ORAL_TABLET | Freq: Two times a day (BID) | ORAL | Status: DC

## 2014-06-03 MED ORDER — ROSUVASTATIN CALCIUM 10 MG PO TABS: 10.0000 mg | ORAL_TABLET | Freq: Every day | ORAL | Status: DC

## 2014-06-03 MED ORDER — METFORMIN HCL 500 MG PO TABS: 500.0000 mg | ORAL_TABLET | Freq: Two times a day (BID) | ORAL | Status: DC

## 2014-06-03 MED ORDER — CHLORTHALIDONE 50 MG PO TABS: ORAL_TABLET | ORAL | Status: DC

## 2014-06-03 MED ORDER — METOPROLOL TARTRATE 50 MG PO TABS: ORAL_TABLET | ORAL | Status: DC

## 2014-06-03 MED ORDER — MELOXICAM 7.5 MG PO TABS: ORAL_TABLET | ORAL | Status: DC

## 2014-06-03 NOTE — Progress Notes (Signed)
Patient ID: [name], female   DOB: October 04, 1956, 58 y.o.   MRN: [id number]  Georges Lynch, MD, MS Phone: [phone]  Subjective:  Chief complaint: Medication refill and knee pain.  Pt Here for medication refill and knee pain. (Bilateral) Patient is here for medication refill and knee pain as well as to meet her new PCP. She states she has been doing well with no additional issues. Her knees continue to bother her as they have for a long time. She has had 2 prior episodes of knee injections for this pain. Pain is currently adequately controlled on MOBIC. Patient states that she has no further concerns. Upon hearing that patient's A1c today was 7.6 patient was noticeably upset. She stated that she's been less able to watch what she has been eating but promises that she will work hard to get this down in the future. She currently sees a podiatrist and ophthalmologist. She states that her podiatrist regularly checks for diabetic neuropathy and that she is currently up-to-date on this. She says that her ophthalmologist also does her annual diabetic eye exam and that she is currently up-to-date. Today we also discussed her hyperlipidemia. She was found to have elevated total cholesterol triglycerides and LDL is on her last fasting lipid panel in October of last year. It appears that she has a low tolerance for statin medications. She complains of regular myalgias on his medications. I attempted to discuss with her the importance of getting her on a lipid control medication. She states that of all the statins she has attempted in the past Crestor at the least amount of side effects.  Review of Systems  Constitutional: Negative for fever and chills.  Eyes: Negative for blurred vision and double vision.  Respiratory: Negative for cough and shortness of breath.   Cardiovascular: Negative for chest pain, palpitations and orthopnea.  Gastrointestinal: Negative for heartburn, nausea, vomiting, abdominal pain and  diarrhea.  Genitourinary: Negative for dysuria.  Musculoskeletal: Positive for myalgias.  Skin: Negative for itching and rash.  Neurological: Negative for dizziness and headaches.  Psychiatric/Behavioral: Negative for depression and substance abuse.     Past Medical History Patient Active Problem List   Diagnosis Date Noted  . Sciatica of right side 02/06/2014  . Sinusitis 01/03/2014  . Conjunctivitis 01/03/2014  . Pain in joint, ankle and foot 07/24/2013  . Bilateral knee pain 11/22/2012  . Leg pain, bilateral 08/16/2011  . Fatigue 05/27/2011  . LUQ abdominal pain 01/26/2011  . DIABETES-TYPE 2 03/08/2008  . LIPOMA 03/31/2007  . HYPERCHOLESTEROLEMIA 12/01/2006  . OBESITY, NOS 12/01/2006  . DEPRESSIVE DISORDER, NOS 12/01/2006  . HYPERTENSION, BENIGN SYSTEMIC 12/01/2006  . GASTROESOPHAGEAL REFLUX, NO ESOPHAGITIS 12/01/2006    Medications- reviewed and updated Current Outpatient Prescriptions  Medication Sig Dispense Refill  . chlorthalidone (HYGROTON) 50 MG tablet TAKE ONE TABLET BY MOUTH ONCE DAILY  30 tablet  3  . enalapril (VASOTEC) 20 MG tablet Take 1 tablet (20 mg total) by mouth 2 (two) times daily.  60 tablet  4  . fluticasone (FLONASE) 50 MCG/ACT nasal spray Place 1 spray into the nose daily.  16 g  5  . gabapentin (NEURONTIN) 300 MG capsule Take 1 capsule (300 mg total) by mouth at bedtime.  30 capsule  0  . meloxicam (MOBIC) 7.5 MG tablet TAKE ONE TABLET BY MOUTH TWICE DAILY  30 tablet  3  . metFORMIN (GLUCOPHAGE) 500 MG tablet Take 1 tablet (500 mg total) by mouth 2 (two) times daily with a  meal.  60 tablet  6  . metoprolol (LOPRESSOR) 50 MG tablet TAKE ONE TABLET BY MOUTH TWICE DAILY  60 tablet  3  . omeprazole (PRILOSEC) 40 MG capsule Take 1 capsule (40 mg total) by mouth daily.  30 capsule  11  . traZODone (DESYREL) 50 MG tablet Take 1 tablet (50 mg total) by mouth at bedtime as needed for sleep. 1-2 tabs by mouth at bedtime as needed for sleep 1-2 tabs by mouth at  bedtime as needed for sleep  30 tablet  2  . rosuvastatin (CRESTOR) 10 MG tablet Take 1 tablet (10 mg total) by mouth daily.  90 tablet  3   No current facility-administered medications for this visit.    Objective: BP 161/74  Pulse 70  Temp(Src) 98 F (36.7 C) (Oral)  Wt 274 lb (124.286 kg) Gen: NAD, alert, obese, cooperative with exam HEENT: NCAT, EOMI CV: RRR, good S1/S2, no murmur Resp: CTABL, no wheezes, non-labored Abd: Soft, Non Tender, Non Distended, BS present, no guarding or organomegaly Ext: No edema, warm Neuro: Alert and oriented, No gross deficits   Assessment/Plan:  HYPERCHOLESTEROLEMIA Patient and I discussed her current medication regimen. She's not been taking her Crestor as scheduled due to the side effects of muscle cramping in her legs. She and I discussed the possibility of changing this prescription. She states that although the side effects are disruptive Crestor has had the least significant amount of side effects that she is experienced for many of the prior statins she has tried in the past. - Today we decided to restart her Crestor at the same dose of 10 mg daily. - She has instructions to discontinue the 10 mg dosage if she begins to experience these myalgias again. At which time she is to begin taking a half tablet (5 mg) daily. - Patient is due for her followup lipid panel at the end of October which she knows understands and plans to schedule an appointment to have this done.  DIABETES-TYPE 2 Patient states that she is actively trying to watch what she eats. She is trying to stay relatively active however her bilateral knee pain has significantly inhibited this at times. She is currently tolerating her medication regimen very well. - Today's A1c of 7.6. This is up from 5 months ago (7.0). Patient is upset by this number and states she will try harder with modification of her diet. - No change will be made in patient's medication regimen. I believe the  patient will attempt to obtain greater control with diet modification. If by her next visit this proves to be inadequate I will make further efforts with medication modification to control her diabetes. - Diabetic foot and eye exam are currently up-to-date. She sees a podiatrist as well as an ophthalmologist for these.  Bilateral knee pain Patient states that this is been persistent. Right greater than left. Pain is adequately controlled with MOBIC. Patient is on omeprazole 40 mg for NSAIDs-induced gastritis prevention. Patient denies any symptoms of heartburn nausea chest pain epigastric pain melena hematochezia hematemesis or coffee-ground emesis. I will continue current MOBIC regimen.  - Patient is open minded about future corticosteroid knee injections. She states she has received these in the past (x2). Patient says that she will reschedule a followup appointment to have these done towards the end of the day in the future.    Orders Placed This Encounter  Procedures  . POCT A1C    Meds ordered this encounter  Medications  .  rosuvastatin (CRESTOR) 10 MG tablet    Sig: Take 1 tablet (10 mg total) by mouth daily.    Dispense:  90 tablet    Refill:  3  . chlorthalidone (HYGROTON) 50 MG tablet    Sig: TAKE ONE TABLET BY MOUTH ONCE DAILY    Dispense:  30 tablet    Refill:  3  . enalapril (VASOTEC) 20 MG tablet    Sig: Take 1 tablet (20 mg total) by mouth 2 (two) times daily.    Dispense:  60 tablet    Refill:  4  . meloxicam (MOBIC) 7.5 MG tablet    Sig: TAKE ONE TABLET BY MOUTH TWICE DAILY    Dispense:  30 tablet    Refill:  3  . metFORMIN (GLUCOPHAGE) 500 MG tablet    Sig: Take 1 tablet (500 mg total) by mouth 2 (two) times daily with a meal.    Dispense:  60 tablet    Refill:  6  . metoprolol (LOPRESSOR) 50 MG tablet    Sig: TAKE ONE TABLET BY MOUTH TWICE DAILY    Dispense:  60 tablet    Refill:  3  . omeprazole (PRILOSEC) 40 MG capsule    Sig: Take 1 capsule (40 mg total)  by mouth daily.    Dispense:  30 capsule    Refill:  11  . traZODone (DESYREL) 50 MG tablet    Sig: Take 1 tablet (50 mg total) by mouth at bedtime as needed for sleep. 1-2 tabs by mouth at bedtime as needed for sleep 1-2 tabs by mouth at bedtime as needed for sleep    Dispense:  30 tablet    Refill:  2

## 2014-06-03 NOTE — Progress Notes (Signed)
Patient ID: [name], female   DOB: 1956/08/03, 58 y.o.   MRN: [id number] Reviewed:  Agree with Dr. Eyvonne Mechanic documentation and management.

## 2014-06-03 NOTE — Assessment & Plan Note (Signed)
Patient and I discussed her current medication regimen. She's not been taking her Crestor as scheduled due to the side effects of muscle cramping in her legs. She and I discussed the possibility of changing this prescription. She states that although the side effects are disruptive Crestor has had the least significant amount of side effects that she is experienced for many of the prior statins she has tried in the past. - Today we decided to restart her Crestor at the same dose of 10 mg daily. - She has instructions to discontinue the 10 mg dosage if she begins to experience these myalgias again. At which time she is to begin taking a half tablet (5 mg) daily. - Patient is due for her followup lipid panel at the end of October which she knows understands and plans to schedule an appointment to have this done.

## 2014-06-03 NOTE — Assessment & Plan Note (Signed)
Patient states that she is actively trying to watch what she eats. She is trying to stay relatively active however her bilateral knee pain has significantly inhibited this at times. She is currently tolerating her medication regimen very well. - Today's A1c of 7.6. This is up from 5 months ago (7.0). Patient is upset by this number and states she will try harder with modification of her diet. - No change will be made in patient's medication regimen. I believe the patient will attempt to obtain greater control with diet modification. If by her next visit this proves to be inadequate I will make further efforts with medication modification to control her diabetes. - Diabetic foot and eye exam are currently up-to-date. She sees a podiatrist as well as an ophthalmologist for these.

## 2014-06-03 NOTE — Assessment & Plan Note (Signed)
Patient states that this is been persistent. Right greater than left. Pain is adequately controlled with MOBIC. Patient is on omeprazole 40 mg for NSAIDs-induced gastritis prevention. Patient denies any symptoms of heartburn nausea chest pain epigastric pain melena hematochezia hematemesis or coffee-ground emesis. I will continue current MOBIC regimen.  - Patient is open minded about future corticosteroid knee injections. She states she has received these in the past (x2). Patient says that she will reschedule a followup appointment to have these done towards the end of the day in the future.

## 2014-06-03 NOTE — Patient Instructions (Addendum)
It was a please seeing you today in our clinic. Today we discussed your knee pain and your medication regimen. Here is the treatment plan we have discussed and agreed upon together:  - We went through and removed any unnecessary medications from your current medication list. - Begin taking your Prilosec with meals again. This will help prevent any GI issues caused by your [facility]. - You're currently psychiatrist's for any foot/ankle issues. - You are currently up-to-date on your annual diabetic foot and eye exams. - If your knee pain increases to the point of intolerance please do not hesitate to schedule an appointment with me for corticosteroid injections. - Restart Crestor 10mg  Daily. If you begin to experience the adverse symptoms you had been experiencing in the past (leg cramping) then discontinue this. At that time I would recommend that you begin taking a 1/2 tablet of Crestor everyday. Please contact me with any issues you experience.

## 2014-06-07 ENCOUNTER — Ambulatory Visit (INDEPENDENT_AMBULATORY_CARE_PROVIDER_SITE_OTHER): Payer: No Typology Code available for payment source | Admitting: Family Medicine

## 2014-06-07 ENCOUNTER — Ambulatory Visit: Payer: No Typology Code available for payment source

## 2014-06-07 ENCOUNTER — Encounter: Payer: Self-pay | Admitting: Family Medicine

## 2014-06-07 VITALS — BP 153/70 | HR 76 | Temp 98.1°F | Ht 63.5 in | Wt 278.0 lb

## 2014-06-07 DIAGNOSIS — M25562 Pain in left knee: Principal | ICD-10-CM

## 2014-06-07 DIAGNOSIS — M25569 Pain in unspecified knee: Secondary | ICD-10-CM

## 2014-06-07 DIAGNOSIS — M25561 Pain in right knee: Secondary | ICD-10-CM

## 2014-06-07 NOTE — Patient Instructions (Signed)
Joint Injection  Care After  Refer to this sheet in the next few days. These instructions provide you with information on caring for yourself after you have had a joint injection. Your caregiver also may give you more specific instructions. Your treatment has been planned according to current medical practices, but problems sometimes occur. Call your caregiver if you have any problems or questions after your procedure.  After any type of joint injection, it is not uncommon to experience:  · Soreness, swelling, or bruising around the injection site.  · Mild numbness, tingling, or weakness around the injection site caused by the numbing medicine used before or with the injection.  It also is possible to experience the following effects associated with the specific agent after injection:  · Iodine-based contrast agents:  ¨ Allergic reaction (itching, hives, widespread redness, and swelling beyond the injection site).  · Corticosteroids (These effects are rare.):  ¨ Allergic reaction.  ¨ Increased blood sugar levels (If you have diabetes and you notice that your blood sugar levels have increased, notify your caregiver).  ¨ Increased blood pressure levels.  ¨ Mood swings.  · Hyaluronic acid in the use of viscosupplementation.  ¨ Temporary heat or redness.  ¨ Temporary rash and itching.  ¨ Increased fluid accumulation in the injected joint.  These effects all should resolve within a day after your procedure.   HOME CARE INSTRUCTIONS  · Limit yourself to light activity the day of your procedure. Avoid lifting heavy objects, bending, stooping, or twisting.  · Take prescription or over-the-counter pain medication as directed by your caregiver.  · You may apply ice to your injection site to reduce pain and swelling the day of your procedure. Ice may be applied 03-04 times:  ¨ Put ice in a plastic bag.  ¨ Place a towel between your skin and the bag.  ¨ Leave the ice on for no longer than 15-20 minutes each time.  SEEK  IMMEDIATE MEDICAL CARE IF:   · Pain and swelling get worse rather than better or extend beyond the injection site.  · Numbness does not go away.  · Blood or fluid continues to leak from the injection site.  · You have chest pain.  · You have swelling of your face or tongue.  · You have trouble breathing or you become dizzy.  · You develop a fever, chills, or severe tenderness at the injection site that last longer than 1 day.  MAKE SURE YOU:  · Understand these instructions.  · Watch your condition.  · Get help right away if you are not doing well or if you get worse.  Document Released: 06/03/2011 Document Revised: 12/13/2011 Document Reviewed: 06/03/2011  [employer]® Patient Information ©2015 [employer]. This information is not intended to replace advice given to you by your health care provider. Make sure you discuss any questions you have with your health care provider.

## 2014-06-10 NOTE — Progress Notes (Signed)
Patient ID: [name], female   DOB: January 29, 1956, 58 y.o.   MRN: [id number]  HPI:  Pt presents for a same day appointment to discuss bilateral knee pain.  Pt has known bilateral osteoarthritis. Had had knee injections in the past and received benefit from them. Last injections were about a year ago. Does not take blood thinners. Has pain with walking. Takes mobic for pain. Desires bilat injections today.  ROS: See HPI  [place]: hx T2DM (recent A1c 7.6), HLD, obesity, depression, HTN, GERD  PHYSICAL EXAM: BP 153/70  Pulse 76  Temp(Src) 98.1 F (36.7 C) (Oral)  Ht 5' 3.5" (1.613 m)  Wt 278 lb (126.1 kg)  BMI 48.47 kg/m2 Gen: NAD HEENT: NCAT Lungs: NWOB Neuro: grossly nonfocal speech normal Ext: bilat knees TTP over joint line. Crepitus present with passive flexion & extension. Full strength with knee flexion & extension. No effusion or warmth present on either knee.  ASSESSMENT/PLAN:  Bilateral knee pain Knees injected today. Anticipate will provide some relief of pain. F/u prn.   Procedure Note: After informed written consent was obtained, patient was seated on exam table. L knee was prepped with alcohol swab. Utilizing anterolateral approach, patient's left knee was injected intraarticularly with mixture of 1cc of depomedrol 40mg /mL and 4cc of 1% lidocaine without epinephrine.   Attention was then turned to the R knee which was similarly prepped with alcohol and injected with 1cc depomedrol 40mg /mL mixed with 4cc of 1% lidocaine without epinephrine via anterolateral approach.   Patient tolerated the procedure well without immediate complications and was given post-procedure instructions.     FOLLOW UP: F/u as needed if symptoms worsen or do not improve.   [place]. Ardelia Mems, [place]

## 2014-06-10 NOTE — Assessment & Plan Note (Signed)
Knees injected today. Anticipate will provide some relief of pain. F/u prn.   Procedure Note: After informed written consent was obtained, patient was seated on exam table. L knee was prepped with alcohol swab. Utilizing anterolateral approach, patient's left knee was injected intraarticularly with mixture of 1cc of depomedrol 40mg /mL and 4cc of 1% lidocaine without epinephrine.   Attention was then turned to the R knee which was similarly prepped with alcohol and injected with 1cc depomedrol 40mg /mL mixed with 4cc of 1% lidocaine without epinephrine via anterolateral approach.   Patient tolerated the procedure well without immediate complications and was given post-procedure instructions.

## 2014-06-11 ENCOUNTER — Other Ambulatory Visit: Payer: Self-pay | Admitting: Family Medicine

## 2014-06-11 ENCOUNTER — Telehealth: Payer: Self-pay | Admitting: Family Medicine

## 2014-06-11 MED ORDER — MELOXICAM 7.5 MG PO TABS: ORAL_TABLET | ORAL | Status: DC

## 2014-06-11 MED ORDER — ENALAPRIL MALEATE 20 MG PO TABS: 20.0000 mg | ORAL_TABLET | Freq: Two times a day (BID) | ORAL | Status: DC

## 2014-06-11 NOTE — Telephone Encounter (Signed)
Pt called because the pharmacy stated that they did not receive a refill request on patients enalapril and her Mobic was called in at 30 qty at twice a day and this needs to be 60 qty to be able to last one month. jw

## 2014-06-17 ENCOUNTER — Ambulatory Visit (INDEPENDENT_AMBULATORY_CARE_PROVIDER_SITE_OTHER): Payer: No Typology Code available for payment source | Admitting: Podiatry

## 2014-06-17 DIAGNOSIS — M79676 Pain in unspecified toe(s): Secondary | ICD-10-CM

## 2014-06-17 DIAGNOSIS — B351 Tinea unguium: Secondary | ICD-10-CM

## 2014-06-17 DIAGNOSIS — M79609 Pain in unspecified limb: Secondary | ICD-10-CM

## 2014-06-18 NOTE — Progress Notes (Signed)
Patient ID: [name], female   DOB: Dec 30, 1955, 58 y.o.   MRN: [id number]  Subjective: This patient presents today complaining of painful toenails  Objective: Hypertrophic, elongated, discolored toenails 6-10  Assessment: Symptomatic onychomycoses 6-10  Plan: Debrided toenails x10 without a bleeding  Reappoint x3 months

## 2014-07-29 ENCOUNTER — Other Ambulatory Visit: Payer: No Typology Code available for payment source

## 2014-07-29 DIAGNOSIS — E7800 Pure hypercholesterolemia, unspecified: Secondary | ICD-10-CM

## 2014-07-29 NOTE — Progress Notes (Signed)
FLP DONE TODAY Vivian Neuwirth 

## 2014-07-30 LAB — LIPID PANEL
CHOL/HDL RATIO: 3 ratio
CHOLESTEROL: 193 mg/dL (ref 0–200)
HDL: 65 mg/dL (ref >39)
LDL Cholesterol: 100 mg/dL — ABNORMAL HIGH (ref 0–99)
TRIGLYCERIDES: 141 mg/dL (ref <150)
VLDL: 28 mg/dL (ref 0–40)

## 2014-08-17 ENCOUNTER — Emergency Department (HOSPITAL_COMMUNITY): Payer: No Typology Code available for payment source

## 2014-08-17 ENCOUNTER — Emergency Department (HOSPITAL_COMMUNITY)
Admission: EM | Admit: 2014-08-17 | Discharge: 2014-08-17 | Disposition: A | Discharge status: Home / Self Care | Payer: No Typology Code available for payment source | Attending: Emergency Medicine | Admitting: Emergency Medicine

## 2014-08-17 ENCOUNTER — Encounter (HOSPITAL_COMMUNITY): Payer: Self-pay | Admitting: Cardiology

## 2014-08-17 DIAGNOSIS — Y998 Other external cause status: Secondary | ICD-10-CM | POA: Insufficient documentation

## 2014-08-17 DIAGNOSIS — Y92481 Parking lot as the place of occurrence of the external cause: Secondary | ICD-10-CM | POA: Insufficient documentation

## 2014-08-17 DIAGNOSIS — Z880 Allergy status to penicillin: Secondary | ICD-10-CM | POA: Insufficient documentation

## 2014-08-17 DIAGNOSIS — S3992XA Unspecified injury of lower back, initial encounter: Secondary | ICD-10-CM | POA: Diagnosis not present

## 2014-08-17 DIAGNOSIS — K219 Gastro-esophageal reflux disease without esophagitis: Secondary | ICD-10-CM | POA: Insufficient documentation

## 2014-08-17 DIAGNOSIS — Z87891 Personal history of nicotine dependence: Secondary | ICD-10-CM | POA: Insufficient documentation

## 2014-08-17 DIAGNOSIS — M545 Low back pain: Secondary | ICD-10-CM

## 2014-08-17 DIAGNOSIS — S199XXA Unspecified injury of neck, initial encounter: Secondary | ICD-10-CM | POA: Diagnosis present

## 2014-08-17 DIAGNOSIS — I10 Essential (primary) hypertension: Secondary | ICD-10-CM | POA: Insufficient documentation

## 2014-08-17 DIAGNOSIS — Z79899 Other long term (current) drug therapy: Secondary | ICD-10-CM | POA: Insufficient documentation

## 2014-08-17 DIAGNOSIS — M542 Cervicalgia: Secondary | ICD-10-CM

## 2014-08-17 DIAGNOSIS — Y9389 Activity, other specified: Secondary | ICD-10-CM | POA: Insufficient documentation

## 2014-08-17 DIAGNOSIS — E119 Type 2 diabetes mellitus without complications: Secondary | ICD-10-CM | POA: Diagnosis not present

## 2014-08-17 DIAGNOSIS — Z791 Long term (current) use of non-steroidal anti-inflammatories (NSAID): Secondary | ICD-10-CM | POA: Insufficient documentation

## 2014-08-17 DIAGNOSIS — Z7951 Long term (current) use of inhaled steroids: Secondary | ICD-10-CM | POA: Insufficient documentation

## 2014-08-17 MED ORDER — HYDROCODONE-ACETAMINOPHEN 5-325 MG PO TABS: 1.0000 | ORAL_TABLET | Freq: Four times a day (QID) | ORAL | Status: DC | PRN

## 2014-08-17 MED ORDER — ONDANSETRON HCL 4 MG PO TABS: 4.0000 mg | ORAL_TABLET | Freq: Four times a day (QID) | ORAL | Status: DC
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
## 2014-09-16 ENCOUNTER — Ambulatory Visit (INDEPENDENT_AMBULATORY_CARE_PROVIDER_SITE_OTHER): Payer: No Typology Code available for payment source | Admitting: Podiatry

## 2014-09-16 ENCOUNTER — Encounter: Payer: Self-pay | Admitting: Podiatry

## 2014-09-16 DIAGNOSIS — M79676 Pain in unspecified toe(s): Secondary | ICD-10-CM

## 2014-09-16 DIAGNOSIS — B351 Tinea unguium: Secondary | ICD-10-CM

## 2014-09-17 NOTE — Progress Notes (Signed)
Patient ID: [name], female   DOB: 12/27/1955, 58 y.o.   MRN: [id number]  Subjective: This patient presents for ongoing debridement of painful toenails  Objective: The toenails are elongated, discolored, hypertrophic 6-10  Assessment: Symptomatic onychomycoses 6-10  Plan: Debrided toenails 10 without a bleeding  Reappoint 3 months

## 2014-11-15 ENCOUNTER — Other Ambulatory Visit: Payer: Self-pay | Admitting: Family Medicine

## 2014-11-19 ENCOUNTER — Other Ambulatory Visit: Payer: Self-pay | Admitting: Family Medicine

## 2014-11-19 ENCOUNTER — Telehealth: Payer: Self-pay | Admitting: Family Medicine

## 2014-11-19 NOTE — Telephone Encounter (Signed)
There were some outstanding refills from prior scripts she had been given. These were written for only 30 tablets -- The scripts I had written for her had both run out (which is why the pharmacy then filled the old scripts). I fixed the problem and she has refills of the appropriate amount waiting for her in the pharmacy.  I would like to see her sometime soon to make sure shes not experiencing any issues w/ this long-term Meloxicam.

## 2014-11-19 NOTE — Telephone Encounter (Signed)
Pt was supposed to get rx for enalapril and meloxicam, supposed to be a qty of 60 but rx was written for 30. Pt would like this to be corrected, goes to [facility]/[address]

## 2014-11-20 NOTE — Telephone Encounter (Signed)
Pt informed and she is scheduled to see you on march 7th. Katrese Shell Kennon Holter, CMA

## 2014-12-09 ENCOUNTER — Encounter: Payer: Self-pay | Admitting: Family Medicine

## 2014-12-09 ENCOUNTER — Ambulatory Visit (INDEPENDENT_AMBULATORY_CARE_PROVIDER_SITE_OTHER): Payer: No Typology Code available for payment source | Admitting: Family Medicine

## 2014-12-09 VITALS — BP 176/64 | HR 73 | Temp 98.1°F | Ht 64.0 in | Wt 274.2 lb

## 2014-12-09 DIAGNOSIS — M25562 Pain in left knee: Secondary | ICD-10-CM

## 2014-12-09 DIAGNOSIS — E119 Type 2 diabetes mellitus without complications: Secondary | ICD-10-CM

## 2014-12-09 DIAGNOSIS — Z23 Encounter for immunization: Secondary | ICD-10-CM

## 2014-12-09 DIAGNOSIS — M25561 Pain in right knee: Secondary | ICD-10-CM

## 2014-12-09 DIAGNOSIS — J069 Acute upper respiratory infection, unspecified: Secondary | ICD-10-CM | POA: Insufficient documentation

## 2014-12-09 DIAGNOSIS — R011 Cardiac murmur, unspecified: Secondary | ICD-10-CM

## 2014-12-09 LAB — POCT GLYCOSYLATED HEMOGLOBIN (HGB A1C): HEMOGLOBIN A1C: 8.7

## 2014-12-09 NOTE — Assessment & Plan Note (Signed)
Patient has been on long-term meloxicam. I've never been overly pleased with this regimen however patient stated that it was very helpful in controlling her pain. At her last visit I let her know that I would like to decrease her use of meloxicam if possible. Because of this patient purchased a bottle of "Tylenol arthritis" which appears to be a form of extended release Tylenol 650 mg. She states that this has significantly improved her pain. I have asked if she felt comfortable substituting this medication with her meloxicam and using her meloxicam only for breakthrough pain. Patient stated her understanding and willing to incorporate this treatment regimen into her daily life.

## 2014-12-09 NOTE — Assessment & Plan Note (Signed)
A1c 8.7. This is an increase from 7.6 on her previous test. Patient states good compliance with all medications. Patient did state that she has been sick on and off for the past couple months. Infection/ailments can cause increases in blood glucose levels. Although this likely has not affected her A1c to much this may have had a slight effect on this value. Also patient's to our ill have a decreased likelihood of continuing their exercise regimens. This would likely have a greater effect on her A1c. I will continue to follow patient and encourage daily exercising and medication compliance.

## 2014-12-09 NOTE — Progress Notes (Addendum)
HPI  Patient presents today for a one-week history of sore throat and earache. Patient states that she's been experiencing a sore throat and earache for the past week. This is been associated with some nausea and dizziness. She states that initially she began experiencing some vague body aches and a low-grade fever. She also had a few days and of diarrhea and currently has a cough. Patient states that she is been sick off and on for the past couple months. No significant symptoms at this time however she is ready to "beat this". no other issues at this time. Patient denies any headaches chest pain changes in vision or fainting vomiting diaphoresis chills shortness of breath.   Reports good compliance with her medications. Pain is under control using her meloxicam and over-the-counter Tylenol. Patient states that the Tylenol she is purchased has been working very well to control her pain--much better than before.  Patient I also discussed her statin. I recommended that she increase her statin dosing to 20 mg daily. However, patient reminded me that she has had a bad history statin use and did not feel comfortable with this increase.  Smoking status noted ROS: Per HPI  Objective: BP 176/64 mmHg  Pulse 73  Temp(Src) 98.1 F (36.7 C) (Oral)  Ht 5\' 4"  (1.626 m)  Wt 274 lb 3.2 oz (124.376 kg)  BMI 47.04 kg/m2 Gen: NAD, alert, cooperative with exam HEENT: NCAT, EOMI, PERRL CV: RRR, good S1/S2, slight 2/5 systolic murmur noted at Rt heart border. Resp: CTABL, no wheezes, non-labored Abd: SNTND, BS present, no guarding or organomegaly Ext: No edema, warm Neuro: Alert and oriented, No gross deficits  Assessment and plan:  URI (upper respiratory infection) Patient complains of a sore throat and earache 1 week. She also has associated symptoms of nausea and dizziness. Initial symptoms were vague body aches and a slight fever. She states that she currently has a cough. No sick contacts. Reports  some yellowish mucus production. Physical exam yielded some oropharyngeal erythema with cobblestoning. Bilateral mild tympanic effusions present. Left-sided sternocleidomastoid lymphadenopathy 1.  Likely viral etiology versus Streptococcus pharyngitis versus mycoplasma pneumoniae. Viral etiology appears to be the most likely diagnosis. Patient has been experiencing or systemic symptoms. No exudate noted on physical exam.   Supportive therapy at this point. Tylenol for fever and body aches (although is not experiencing these at this time). I asked that she continue to watch these symptoms. If she is no improvement by the end of this week I asked that she contact me and I may place her on antibiotic therapy.   Diabetes mellitus, type II A1c 8.7. This is an increase from 7.6 on her previous test. Patient states good compliance with all medications. Patient did state that she has been sick on and off for the past couple months. Infection/ailments can cause increases in blood glucose levels. Although this likely has not affected her A1c to much this may have had a slight effect on this value. Also patient's to our ill have a decreased likelihood of continuing their exercise regimens. This would likely have a greater effect on her A1c. I will continue to follow patient and encourage daily exercising and medication compliance.   Bilateral knee pain Patient has been on long-term meloxicam. I've never been overly pleased with this regimen however patient stated that it was very helpful in controlling her pain. At her last visit I let her know that I would like to decrease her use of meloxicam if possible.  Because of this patient purchased a bottle of "Tylenol arthritis" which appears to be a form of extended release Tylenol 650 mg. She states that this has significantly improved her pain. I have asked if she felt comfortable substituting this medication with her meloxicam and using her meloxicam only for  breakthrough pain. Patient stated her understanding and willing to incorporate this treatment regimen into her daily life.     Orders Placed This Encounter  Procedures  . Flu Vaccine QUAD 36+ mos IM  . HgB A1c    No orders of the defined types were placed in this encounter.     Elberta Leatherwood, MD,MS,  PGY1 12/09/2014 5:33 PM

## 2014-12-09 NOTE — Addendum Note (Signed)
Addended byGeorges Lynch D on: 12/09/2014 05:33 PM   Modules accepted: Miquel Dunn

## 2014-12-09 NOTE — Patient Instructions (Signed)
It was a pleasure seeing you today in our clinic. Today we discussed your sore throat, and medications. Here is the treatment plan we have discussed and agreed upon together:   - Regarding your sore throat: I think we can watch this for now. Because you have been dealing with this for a week already I feel comfortable say that if this has not improved by the end of the week than call and leave a message for me at the office. - Regarding your medications: I like the idea of reducing the amount of meloxicam you take and replacing this with the over-the-counter Tylenol you brought in with you. Feel free to use the Meloxicam as an option for "breakthrough" pain on days you are having a hard time controlling the pain.  - Also: I'm glad we were able to discuss your Statin medication. We will leave this dosing the same due to the trouble you've had in the past with statin medications

## 2014-12-09 NOTE — Assessment & Plan Note (Signed)
Patient complains of a sore throat and earache 1 week. She also has associated symptoms of nausea and dizziness. Initial symptoms were vague body aches and a slight fever. She states that she currently has a cough. No sick contacts. Reports some yellowish mucus production. Physical exam yielded some oropharyngeal erythema with cobblestoning. Bilateral mild tympanic effusions present. Left-sided sternocleidomastoid lymphadenopathy 1.  Likely viral etiology versus Streptococcus pharyngitis versus mycoplasma pneumoniae. Viral etiology appears to be the most likely diagnosis. Patient has been experiencing or systemic symptoms. No exudate noted on physical exam.   Supportive therapy at this point. Tylenol for fever and body aches (although is not experiencing these at this time). I asked that she continue to watch these symptoms. If she is no improvement by the end of this week I asked that she contact me and I may place her on antibiotic therapy.

## 2014-12-13 ENCOUNTER — Telehealth: Payer: Self-pay | Admitting: Family Medicine

## 2014-12-13 ENCOUNTER — Other Ambulatory Visit: Payer: Self-pay | Admitting: Family Medicine

## 2014-12-13 MED ORDER — CLARITHROMYCIN 250 MG PO TABS: 250.0000 mg | ORAL_TABLET | Freq: Two times a day (BID) | ORAL | Status: DC

## 2014-12-13 NOTE — Telephone Encounter (Signed)
[name] still having issues and need rx sent for throat and ears.  Please contact her to let her know when sent.

## 2014-12-13 NOTE — Telephone Encounter (Signed)
Pt voiced understanding and doesn't take her norco often.  Jazmin Hartsell,CMA

## 2014-12-13 NOTE — Telephone Encounter (Signed)
Prescription sent in for Clarithromycin. To be taken 2 times a day for 14 days. Please inform her that I do NOT recommend taking her Norco while on this medication.  Thanks!

## 2014-12-16 ENCOUNTER — Ambulatory Visit: Payer: No Typology Code available for payment source | Admitting: Podiatry

## 2015-01-21 ENCOUNTER — Other Ambulatory Visit: Payer: Self-pay | Admitting: Family Medicine

## 2015-03-05 ENCOUNTER — Encounter: Payer: No Typology Code available for payment source | Admitting: Family Medicine

## 2015-03-17 ENCOUNTER — Other Ambulatory Visit: Payer: Self-pay | Admitting: *Deleted

## 2015-03-17 MED ORDER — FLUTICASONE PROPIONATE 50 MCG/ACT NA SUSP: 1.0000 | Freq: Every day | NASAL | Status: DC

## 2015-03-19 ENCOUNTER — Encounter: Payer: No Typology Code available for payment source | Admitting: Family Medicine

## 2015-03-27 ENCOUNTER — Other Ambulatory Visit: Payer: Self-pay | Admitting: Family Medicine

## 2015-04-08 ENCOUNTER — Ambulatory Visit (INDEPENDENT_AMBULATORY_CARE_PROVIDER_SITE_OTHER): Payer: No Typology Code available for payment source | Admitting: Family Medicine

## 2015-04-08 ENCOUNTER — Encounter: Payer: Self-pay | Admitting: Family Medicine

## 2015-04-08 VITALS — BP 161/67 | HR 76 | Temp 97.9°F | Ht 64.0 in | Wt 272.9 lb

## 2015-04-08 DIAGNOSIS — N950 Postmenopausal bleeding: Secondary | ICD-10-CM | POA: Diagnosis not present

## 2015-04-08 DIAGNOSIS — R079 Chest pain, unspecified: Secondary | ICD-10-CM | POA: Diagnosis not present

## 2015-04-08 DIAGNOSIS — N939 Abnormal uterine and vaginal bleeding, unspecified: Secondary | ICD-10-CM

## 2015-04-08 DIAGNOSIS — E1165 Type 2 diabetes mellitus with hyperglycemia: Secondary | ICD-10-CM | POA: Diagnosis not present

## 2015-04-08 DIAGNOSIS — M79672 Pain in left foot: Secondary | ICD-10-CM

## 2015-04-08 LAB — POCT GLYCOSYLATED HEMOGLOBIN (HGB A1C): Hemoglobin A1C: 9.1

## 2015-04-08 NOTE — Patient Instructions (Signed)
It was a pleasure seeing you today in our clinic. Today we discussed your vaginal bleeding, foot/leg, and chest pain. Here is the treatment plan we have discussed and agreed upon together:   - We performed an EKG today. Everything looks fine. We will scan this into our system so we have it on file. - For your foot pain, try the icing technique with the frozen water bottle we discussed in the clinic. Perform this technique at least 2-3 times a day. - For your vaginal bleeding I'd like to have you get a transvaginal ultrasound. This will be done at the [facility] hospital. That will help me determine what is causing this bleeding.

## 2015-04-09 LAB — BASIC METABOLIC PANEL WITH GFR
BUN: 20 mg/dL (ref 6–23)
CHLORIDE: 97 meq/L (ref 96–112)
CO2: 24 mEq/L (ref 19–32)
Calcium: 9.2 mg/dL (ref 8.4–10.5)
Creat: 0.86 mg/dL (ref 0.50–1.10)
GFR, Est African American: 85 mL/min
GFR, Est Non African American: 74 mL/min
Glucose, Bld: 278 mg/dL — ABNORMAL HIGH (ref 70–99)
POTASSIUM: 3.5 meq/L (ref 3.5–5.3)
Sodium: 136 mEq/L (ref 135–145)

## 2015-04-09 LAB — CBC
HEMATOCRIT: 38.3 % (ref 36.0–46.0)
Hemoglobin: 13 g/dL (ref 12.0–15.0)
MCH: 28.6 pg (ref 26.0–34.0)
MCHC: 33.9 g/dL (ref 30.0–36.0)
MCV: 84.4 fL (ref 78.0–100.0)
MPV: 11.2 fL (ref 8.6–12.4)
Platelets: 369 10*3/uL (ref 150–400)
RBC: 4.54 MIL/uL (ref 3.87–5.11)
RDW: 13.8 % (ref 11.5–15.5)
WBC: 10.1 10*3/uL (ref 4.0–10.5)

## 2015-04-11 DIAGNOSIS — M79672 Pain in left foot: Secondary | ICD-10-CM | POA: Insufficient documentation

## 2015-04-11 DIAGNOSIS — R079 Chest pain, unspecified: Secondary | ICD-10-CM | POA: Insufficient documentation

## 2015-04-11 DIAGNOSIS — N939 Abnormal uterine and vaginal bleeding, unspecified: Secondary | ICD-10-CM | POA: Insufficient documentation

## 2015-04-11 MED ORDER — ASPIRIN EC 81 MG PO TBEC: 81.0000 mg | DELAYED_RELEASE_TABLET | Freq: Every day | ORAL | Status: DC

## 2015-04-11 NOTE — Assessment & Plan Note (Addendum)
Patient had vague chest pain ~2wks ago. Pain is no longer present. Patient denied any activity she does that brings out this pain. EKG was obtained during this visit. EKG showed some T wave inversions indicative of either old damage or some ischemia. B/c patient is pain free this is likely non-acute. The was no baseline EKG to base any changes off of.  - No significant w/u deemed necessary at this time as patient is currently asymptomatic and has been for >1wk - I STRONGLY encouraged patient to seek medical care immediately if she experiences these sxs again.. Patient stated her understanding. - ASA

## 2015-04-11 NOTE — Assessment & Plan Note (Signed)
Patient's Sxs c/w tenosynovitis of the flexors of the foot (flexor digitorum brevis or flexor hallucis brevis?). Does not have consistencies w/ plantar fascitis.  - recommended RICE treatment. Icing at least 3x a day. Recommended freezing bottle to roll over the effected area for ~10-23min - no prescriptions deemed necessary at this time.

## 2015-04-11 NOTE — Assessment & Plan Note (Addendum)
Unknown etiology at this time. Cannot r/o neoplastic cause at this time. DDx includes fibroids, neoplasm (endometrial/cervical/vaginal/bladder), infectious, delayed menopause. - pelvic US ordered - CBC ordered - I have asked that she f/u w/ me once results are back.   Next: endometrial bx if necessary

## 2015-04-11 NOTE — Progress Notes (Signed)
HPI  CC: foot pain Patient is here for her foot pain. Pain started earlier this AM. Pain is sharp and achy in nature. Localized to the anterior transverse arch of the Lt foot. Does not radiate. She denies trauma to this area. She has had some pain like this in the past but this seems worse; in the past it typically resolved in a few days. Denies rash, erythema, ecchymosis, edema, insect bites, numbness, or weakness.  Patient also discussed some issues she has had w/ her "mensual cycle" she states that it seems to be fairly irregular. She will have occasional bleeding and clots passed for a 4-6wk span, only to then go w/o any "peroid" for 5-6 months. This has been going on for over a year. Bleeding is minimal. Does not require a pad. She has not been sexually active since her husband passed (~a decade). Patient understands this is abnormal b/c she "thought [she] already went through menopause". Denies fould odor, itching, pain, dysuria, hematuria, dizziness, lightheadedness, fatigue, HA, hot flashes, diaphoresis, n/v/d, SOB.  ROS was positive for Chest pain. We discussed this at length and she stated that she had experienced unprovoked CP 2 weeks ago. Pain was substernal. She had a difficult time describing the pain beyond that. Pain lasted ~3 days. CP was no longer present, and had not been for >1wk. Nothing improved or worsened this pain. It seemed to resolve on its own.  Review of Systems   See HPI for ROS. All other systems reviewed and are negative.  Past medical history and social history reviewed and updated in the EMR as appropriate.  Objective: BP 161/67 mmHg  Pulse 76  Temp(Src) 97.9 F (36.6 C) (Oral)  Ht 5\' 4"  (1.626 m)  Wt 272 lb 14.4 oz (123.787 kg)  BMI 46.82 kg/m2 Gen: NAD, alert, cooperative HEENT: NCAT, EOMI, PERRL, OP clear, no scleral icterus CV: RRR, no murmur Resp: CTAB, no wheezes, non-labored Abd: SNTND, BS present, no guarding or organomegaly Ext: No edema,  warm, pulses present bilaterally. ROM preserved throughout. Tenderness noted at the medial aspect of the anterior transverse arch of the Lt foot. No skin or gross boney abnormalities. Sensation intact throughout. Neuro: Alert and oriented, Speech clear, No gross deficits  Assessment and plan:  Left foot pain Patient's Sxs c/w tenosynovitis of the flexors of the foot (flexor digitorum brevis or flexor hallucis brevis?). Does not have consistencies w/ plantar fascitis.  - recommended RICE treatment. Icing at least 3x a day. Recommended freezing bottle to roll over the effected area for ~10-74min - no prescriptions deemed necessary at this time.  Abnormal uterine bleeding Unknown etiology at this time. Cannot r/o neoplastic cause at this time. DDx includes fibroids, neoplasm (endometrial/cervical/vaginal/bladder), infectious, delayed menopause. - pelvic US ordered - CBC ordered - I have asked that she f/u w/ me once results are back.   Next: endometrial bx if necessary  Diabetes mellitus, type II Seems fairly well controlled - A1c ordered - BMP  Pain in the chest Patient had vague chest pain ~2wks ago. Pain is no longer present. Patient denied any activity she does that brings out this pain. EKG was obtained during this visit. EKG showed some T wave inversions indicative of either old damage or some ischemia. B/c patient is pain free this is likely non-acute. The was no baseline EKG to base any changes off of.  - No significant w/u deemed necessary at this time as patient is currently asymptomatic and has been for >1wk -  I STRONGLY encouraged patient to seek medical care immediately if she experiences these sxs again.. Patient stated her understanding. - ASA    Orders Placed This Encounter  Procedures  . US Transvaginal Non-OB    Standing Status: Future     Number of Occurrences:      Standing Expiration Date: 06/08/2016    Order Specific Question:  Reason for Exam (SYMPTOM  OR  DIAGNOSIS REQUIRED)    Answer:  postmenopausal bleeding    Order Specific Question:  Preferred imaging location?    Answer:  [facility]  . US Pelvis Complete    Standing Status: Future     Number of Occurrences:      Standing Expiration Date: 06/08/2016    Order Specific Question:  Reason for Exam (SYMPTOM  OR DIAGNOSIS REQUIRED)    Answer:  postmenopausal bleeding    Order Specific Question:  Preferred imaging location?    Answer:  [facility]  . CBC  . BASIC METABOLIC PANEL WITH GFR  . POCT HgB A1C  . EKG 12-Lead    Meds ordered this encounter  Medications  . aspirin EC 81 MG tablet    Sig: Take 1 tablet (81 mg total) by mouth daily.    Dispense:  90 tablet    Refill:  3     Elberta Leatherwood, MD,MS,  PGY1 04/11/2015 9:22 PM

## 2015-04-11 NOTE — Assessment & Plan Note (Addendum)
Seems fairly well controlled - A1c ordered - BMP

## 2015-04-15 ENCOUNTER — Ambulatory Visit (HOSPITAL_COMMUNITY)
Admission: RE | Admit: 2015-04-15 | Discharge: 2015-04-15 | Disposition: A | Discharge status: Home / Self Care | Payer: No Typology Code available for payment source | Source: Ambulatory Visit | Attending: Family Medicine | Admitting: Family Medicine

## 2015-04-15 DIAGNOSIS — E119 Type 2 diabetes mellitus without complications: Secondary | ICD-10-CM | POA: Diagnosis not present

## 2015-04-15 DIAGNOSIS — N950 Postmenopausal bleeding: Secondary | ICD-10-CM | POA: Insufficient documentation

## 2015-04-15 DIAGNOSIS — Z87891 Personal history of nicotine dependence: Secondary | ICD-10-CM | POA: Diagnosis not present

## 2015-04-15 DIAGNOSIS — I10 Essential (primary) hypertension: Secondary | ICD-10-CM | POA: Diagnosis not present

## 2015-04-18 ENCOUNTER — Telehealth: Payer: Self-pay | Admitting: Family Medicine

## 2015-04-18 NOTE — Telephone Encounter (Signed)
I was able to call and discuss the results of this [place] w/ patient over the phone. I informed her that the US showed a thickened endometrial stripe. This indicates a need for further w/u w/ bx. Patient stated her understanding of this and her desire to continue the workup. I informed her she will need to make an appt in our office soon. I do not have an appt available next week, so I informed her that one of my colleagues may have an appt sooner than I--patient said she would rather see me for this bx.  Elberta Leatherwood, MD,MS,  PGY2 04/18/2015 3:36 PM

## 2015-05-01 ENCOUNTER — Ambulatory Visit (INDEPENDENT_AMBULATORY_CARE_PROVIDER_SITE_OTHER): Payer: No Typology Code available for payment source | Admitting: Family Medicine

## 2015-05-01 ENCOUNTER — Encounter: Payer: Self-pay | Admitting: Family Medicine

## 2015-05-01 VITALS — BP 170/73 | HR 75 | Temp 97.9°F | Ht 64.0 in | Wt 272.5 lb

## 2015-05-01 DIAGNOSIS — N939 Abnormal uterine and vaginal bleeding, unspecified: Secondary | ICD-10-CM

## 2015-05-01 NOTE — Patient Instructions (Signed)
It was a pleasure seeing you today in our clinic. Today we performed an endometrial biopsy. Here is the treatment plan we have discussed and agreed upon together:   - Please schedule an appointment for Wednesday the 3rd of August, or Wednesday the 10th of August, to discuss the results.

## 2015-05-02 NOTE — Assessment & Plan Note (Signed)
Biopsy obtained. Patient tolerated procedure very well. We discussed postprocedure spotting, and s/s of infection to watch for. - I have asked patient to f/u w/ me in one week to go over the results. I will contact her the day of that visit if the results are NOT in at that point, so she can reschedule. This was a plan she and I agreed upon together and she stated she'd feel much more comfortable with. - sample sent for path.

## 2015-05-02 NOTE — Progress Notes (Signed)
   HPI  CC: endometrial bx Patient here today for bx. Has been experiencing AUB for many months. No status change. Patient nervous about procedure but states she is ready to get it done. Procedure explained to her in step-by-step detail. Answered all questions she had. Patient ok to proceed w/ bx. [name] present for procedure.  ROS: no recent fevers, chills, dysuria, intercourse, n/v/d/c, abdominal pain. All other systems reviewed and are negative.  Past medical history and social history reviewed and updated in the EMR as appropriate.  Objective: BP 170/73 mmHg  Pulse 75  Temp(Src) 97.9 F (36.6 C) (Oral)  Ht 5\' 4"  (1.626 m)  Wt 272 lb 8 oz (123.605 kg)  BMI 46.75 kg/m2 Gen: NAD, alert, cooperative, and pleasant. CV: RRR Resp: CTAB, no wheezes, non-labored Abd: SNTND, BS present, obese Pelvic exam: normal external genitalia, vulva, vagina, cervix, uterus and adnexa. Ext: No edema, warm Neuro: Alert and oriented, Speech clear, No gross deficits  PROCEDURE NOTE: Endometrial Biopsy Patient given informed consent, signed copy in the chart.  Appropriate time out taken. The patient was placed in the lithotomy position and the cervix brought into view with sterile speculum. The portio of cervix was cleansed x 3 with betadine swabs.  A tenaculum was placed in the anterior lip of the cervix.  A uterine sound was used to measure the uterus (~6cm). A pipelle (x2) was introduced  into the uterus, suction created,  and an endometrial sample was obtained. All equipment was removed and accounted for.   The patient tolerated the procedure well.  Minimal spotting type bleeding.    Assessment and plan:  Abnormal uterine bleeding Biopsy obtained. Patient tolerated procedure very well. We discussed postprocedure spotting, and s/s of infection to watch for. - I have asked patient to f/u w/ me in one week to go over the results. I will contact her the day of that visit if the results are NOT in  at that point, so she can reschedule. This was a plan she and I agreed upon together and she stated she'd feel much more comfortable with. - sample sent for path.    Cheyenne Leatherwood, MD,MS,  PGY2 05/02/2015 6:05 PM

## 2015-05-07 ENCOUNTER — Ambulatory Visit (INDEPENDENT_AMBULATORY_CARE_PROVIDER_SITE_OTHER): Payer: No Typology Code available for payment source | Admitting: Family Medicine

## 2015-05-07 ENCOUNTER — Encounter: Payer: Self-pay | Admitting: Family Medicine

## 2015-05-07 VITALS — BP 148/53 | HR 76 | Temp 98.3°F | Ht 64.0 in | Wt 272.0 lb

## 2015-05-07 DIAGNOSIS — N939 Abnormal uterine and vaginal bleeding, unspecified: Secondary | ICD-10-CM

## 2015-05-07 NOTE — Patient Instructions (Signed)
It was a pleasure seeing you today in our clinic. Today we discussed your biopsy report. Here is the treatment plan we have discussed and agreed upon together:   - Your biopsy was NEGATIVE for any signs of malignancy! - I have sent a referral to Gynecology. At this point they may be able to give [place] more answers to what is causing this issue. - I will follow along your progress with them the best I can. - Please call me if you have any questions.

## 2015-05-09 NOTE — Progress Notes (Signed)
   HPI  CC: biopsy f/u  Patient here for endometrial biopsy results. Results were negative for hyperplasia/malignancy. Patient was obviously very relieved.  AUB has been ongoing for ~70mths. Spotting occurs irregularly. She will pass a large clot every ~71mths. No change in symptoms at this time. Patient is very interested in determining where to go from here in her w/u. No new weight loss, fatigue, confusion, dizziness, HA, SOB, CP, n/v/d, dysuria, or bone pain.  Review of Systems   See HPI for ROS. All other systems reviewed and are negative.  Past medical history and social history reviewed and updated in the EMR as appropriate.  Objective: BP 148/53 mmHg  Pulse 76  Temp(Src) 98.3 F (36.8 C) (Oral)  Ht 5\' 4"  (1.626 m)  Wt 272 lb (123.378 kg)  BMI 46.67 kg/m2 Gen: NAD, alert, cooperative, and pleasant. CV: RRR, no murmur Resp: CTAB, no wheezes, non-labored Neuro: Alert and oriented, Speech clear, No gross deficits  Assessment and plan:  Abnormal uterine bleeding Biopsy neg for hyperplasia/malignancy. US showed thickened endometrium. Light spotting continues. Etiology unknown at this time. I feel that at this point it is reasonable to refer patient to GYN. Patient was in agreement with this. Will follow patient's case closely.    Orders Placed This Encounter  Procedures  . Ambulatory referral to Gynecology    Referral Priority:  Routine    Referral Type:  Consultation    Referral Reason:  Specialty Services Required    Requested Specialty:  Gynecology    Number of Visits Requested:  1     Elberta Leatherwood, MD,MS,  PGY2 05/09/2015 2:50 PM

## 2015-05-09 NOTE — Assessment & Plan Note (Signed)
Biopsy neg for hyperplasia/malignancy. US showed thickened endometrium. Light spotting continues. Etiology unknown at this time. I feel that at this point it is reasonable to refer patient to GYN. Patient was in agreement with this. Will follow patient's case closely.

## 2015-05-20 ENCOUNTER — Other Ambulatory Visit: Payer: Self-pay | Admitting: Gynecology

## 2015-05-20 ENCOUNTER — Encounter: Payer: Self-pay | Admitting: Gynecology

## 2015-05-20 ENCOUNTER — Other Ambulatory Visit (HOSPITAL_COMMUNITY)
Admission: RE | Admit: 2015-05-20 | Discharge: 2015-05-20 | Disposition: A | Discharge status: Home / Self Care | Payer: No Typology Code available for payment source | Source: Ambulatory Visit | Attending: Gynecology | Admitting: Gynecology

## 2015-05-20 ENCOUNTER — Ambulatory Visit (INDEPENDENT_AMBULATORY_CARE_PROVIDER_SITE_OTHER): Payer: No Typology Code available for payment source | Admitting: Gynecology

## 2015-05-20 VITALS — BP 138/90 | Ht 63.0 in | Wt 273.0 lb

## 2015-05-20 DIAGNOSIS — N950 Postmenopausal bleeding: Secondary | ICD-10-CM

## 2015-05-20 DIAGNOSIS — Z1151 Encounter for screening for human papillomavirus (HPV): Secondary | ICD-10-CM | POA: Diagnosis present

## 2015-05-20 DIAGNOSIS — Z01419 Encounter for gynecological examination (general) (routine) without abnormal findings: Secondary | ICD-10-CM | POA: Diagnosis not present

## 2015-05-20 DIAGNOSIS — Z124 Encounter for screening for malignant neoplasm of cervix: Secondary | ICD-10-CM | POA: Diagnosis not present

## 2015-05-20 NOTE — Patient Instructions (Signed)

## 2015-05-20 NOTE — Progress Notes (Signed)
   Patient is a gravida 4 para 2 Ab2 with prior tubal sterilization procedure who was referred to our practice as a courtesy of patient's PCP Dr. Georges Lynch as a result of patient's postmenopausal bleeding. She states that she has had this bleeding for 18 months at time she was bottle at times that she would pass blood clots every 6-8 weeks. Patient stated that after the birth of her child in 75 she did not have a cycle for 6 years. She had been placed on oral contraception pills. She has been on no hormone replacement therapy she denies any vasomotor symptoms she stated that her menarche was at the age of 8 and that she recently menopause around 60. Her PCP did an endometrial biopsy on July 28 of this year with the following pathology report:  Diagnosis Endometrium, biopsy - ATROPHIC APPEARING ENDOMETRIUM. - THERE IS NO EVIDENCE OF HYPERPLASIA OR MALIGNANCY.  On July 12 a pelvic ultrasound had been ordered with the following report noted: FINDINGS: Uterus  Measurements: 8.0 x 4.4 x 5.1 cm. Retroverted without focal mass.  Endometrium  Thickness: 7.6 mm. Heterogeneous with mobile internal echoes. There is increased vascularity within the endometrium. No focal mass identified.  Right ovary  Measurements: The ovary is not visualized, and may be absent or obscured. No adnexal mass identified.  Left ovary  Measurements: The ovary is not visualized, either absent or obscured . No adnexal mass identified.  Exam:  Blood pressure 130/90   Weight 273 pounds   5 feet 2 inches tall   BMI 48.36 kg meter for meter square  Gen. Appearance well-developed well-nourished female with above mentioned complaint  Exam: Abdomen soft pendulous nontender no rebound or guarding Pelvic: Bartholin urethra Skene was within normal limits Vagina: No lesions or discharge Cervix: No lesions or discharge  Uterus: difficult to assess size due to patient's abdominal girth Adnexa: Same as above Rectal  exam: Not done   Assessment/plan: morbidly obese patient  With history of hypertension and type 2 diabetes with postmenopausal bleeding for 18 months. PCP recently  Did an endometrial biopsy which demonstrated atrophic endometrium but ultrasound demonstrated a thickened endometrium 7.6 mm heterogeneous with internal level echoes an increased vascularity  To the endometrium. For this reason I'm going to ask patient to return back to the office within the next week to schedule sonohysterogram to rule out any intracavitary defect such as endometrial polyp or submucous myoma contribute to her is menopausal bleeding. I have explained to her that if there is no intracavitary defect noted that I'm going to resample her again vigorously to submit additional tissue for histological evaluation.   Her Pap smear was done today since she has not had one from 2012. She stated her colonoscopy was 10 years ago and she see at that scheduled. She also had a mammogram in 2015. Literature information was provided.

## 2015-05-22 LAB — CYTOLOGY - PAP

## 2015-05-28 ENCOUNTER — Telehealth: Payer: Self-pay | Admitting: Gynecology

## 2015-05-28 NOTE — Telephone Encounter (Signed)
05/28/15-I spoke with pt and let her know that her [employer] ins covers the sonohysterogram as follows: There is a 20% coins for the [id number] and [id number] of $79.88. There is a 30% coins on the [id number] which would be $95.92. If bx needed, 30% coins of $71.07. Pt will call back to let me know if she wants to proceed. Offered payment plan.wl

## 2015-06-04 ENCOUNTER — Ambulatory Visit (INDEPENDENT_AMBULATORY_CARE_PROVIDER_SITE_OTHER): Payer: No Typology Code available for payment source

## 2015-06-04 ENCOUNTER — Other Ambulatory Visit: Payer: Self-pay | Admitting: Gynecology

## 2015-06-04 ENCOUNTER — Ambulatory Visit (INDEPENDENT_AMBULATORY_CARE_PROVIDER_SITE_OTHER): Payer: No Typology Code available for payment source | Admitting: Gynecology

## 2015-06-04 ENCOUNTER — Encounter: Payer: Self-pay | Admitting: Gynecology

## 2015-06-04 DIAGNOSIS — N8331 Acquired atrophy of ovary: Secondary | ICD-10-CM | POA: Diagnosis not present

## 2015-06-04 DIAGNOSIS — N83319 Acquired atrophy of ovary, unspecified side: Secondary | ICD-10-CM

## 2015-06-04 DIAGNOSIS — N9489 Other specified conditions associated with female genital organs and menstrual cycle: Secondary | ICD-10-CM

## 2015-06-04 DIAGNOSIS — N950 Postmenopausal bleeding: Secondary | ICD-10-CM

## 2015-06-04 DIAGNOSIS — R938 Abnormal findings on diagnostic imaging of other specified body structures: Secondary | ICD-10-CM | POA: Diagnosis not present

## 2015-06-04 DIAGNOSIS — R9389 Abnormal findings on diagnostic imaging of other specified body structures: Secondary | ICD-10-CM

## 2015-06-04 DIAGNOSIS — N840 Polyp of corpus uteri: Secondary | ICD-10-CM

## 2015-06-04 NOTE — Progress Notes (Signed)
   Patient is a 59 year old who presented to the office today for sonohysterogram as part of her evaluation for postmenopausal bleeding. Patient was last seen the office on 05/20/2015 her primary care physician Dr.Ian McKeag had referred her to [place] after he had done an endometrial biopsy and his office which demonstrated the following:  Diagnosis Endometrium, biopsy - ATROPHIC APPEARING ENDOMETRIUM. - THERE IS NO EVIDENCE OF HYPERPLASIA OR MALIGNANCY.  On July 12 a pelvic ultrasound had been ordered with the following report noted: FINDINGS: Uterus  Measurements: 8.0 x 4.4 x 5.1 cm. Retroverted without focal mass.  Endometrium  Thickness: 7.6 mm. Heterogeneous with mobile internal echoes. There is increased vascularity within the endometrium. No focal mass identified.  Right ovary  Measurements: The ovary is not visualized, and may be absent or obscured. No adnexal mass identified.  Left ovary  Measurements: The ovary is not visualized, either absent or obscured . No adnexal mass identified.  She had a Pap smear here in the office on last visit and she's here for sonohysterogram. Patient was counseled for the procedure. The cervix was cleansed with Betadine solution and a sterile catheter was introduced into the uterine cavity and a posterior right endometrial polyp measuring 22 x 6 x 9 millimeter was noted. Right and left ovary were atrophic. Endometrial stripe 6.3 mm as a result of endometrial polyp. Uterus measures 7.0 x 5.5 x 4.5 cm.  Assessment/plan: Postmenopausal bleeding on this 59 year old with normal endometrial biopsy but sonohysterogram demonstrating a large endometrial polyp. Patient will be scheduled to undergo outpatient resectoscopic polypectomy in a few weeks. Literature information was provided.

## 2015-06-05 ENCOUNTER — Telehealth: Payer: Self-pay

## 2015-06-05 NOTE — Telephone Encounter (Signed)
I contacted patient regarding Dr. Moshe Salisbury has sent me order to schedule outpatient surgery. I discussed insurance benefits with her and her estimated surgery prepayment. Patient tells me there is not any way she would ever be able to have the full estimated surgery prepymt at one time. She said she might could pay partial payment. She wants to look at her finances and call me back with what she could pay. I explained that I do not make decisions regarding payment amounts but can present it to those who do. I will wait to hear from her.

## 2015-06-07 ENCOUNTER — Other Ambulatory Visit: Payer: Self-pay | Admitting: Family Medicine

## 2015-06-18 ENCOUNTER — Encounter: Payer: Self-pay | Admitting: Gynecology

## 2015-07-07 ENCOUNTER — Other Ambulatory Visit: Payer: Self-pay | Admitting: Family Medicine

## 2015-08-08 ENCOUNTER — Other Ambulatory Visit: Payer: Self-pay | Admitting: Family Medicine

## 2015-08-18 ENCOUNTER — Encounter: Payer: Self-pay | Admitting: Gynecology

## 2015-08-18 ENCOUNTER — Ambulatory Visit (INDEPENDENT_AMBULATORY_CARE_PROVIDER_SITE_OTHER): Payer: No Typology Code available for payment source | Admitting: Gynecology

## 2015-08-18 ENCOUNTER — Encounter (HOSPITAL_COMMUNITY)
Admission: RE | Admit: 2015-08-18 | Discharge: 2015-08-18 | Disposition: A | Discharge status: Home / Self Care | Payer: No Typology Code available for payment source | Source: Ambulatory Visit | Attending: Gynecology | Admitting: Gynecology

## 2015-08-18 ENCOUNTER — Encounter (HOSPITAL_COMMUNITY): Payer: Self-pay

## 2015-08-18 VITALS — BP 126/84

## 2015-08-18 DIAGNOSIS — N950 Postmenopausal bleeding: Secondary | ICD-10-CM | POA: Insufficient documentation

## 2015-08-18 DIAGNOSIS — N840 Polyp of corpus uteri: Secondary | ICD-10-CM

## 2015-08-18 DIAGNOSIS — Z01818 Encounter for other preprocedural examination: Secondary | ICD-10-CM | POA: Diagnosis present

## 2015-08-18 LAB — BASIC METABOLIC PANEL
Anion gap: 13 (ref 5–15)
BUN: 17 mg/dL (ref 6–20)
CALCIUM: 9 mg/dL (ref 8.9–10.3)
CO2: 26 mmol/L (ref 22–32)
CREATININE: 0.93 mg/dL (ref 0.44–1.00)
Chloride: 98 mmol/L — ABNORMAL LOW (ref 101–111)
GFR calc Af Amer: >60 mL/min (ref >60)
GFR calc non Af Amer: >60 mL/min (ref >60)
GLUCOSE: 258 mg/dL — AB (ref 65–99)
Potassium: 3.5 mmol/L (ref 3.5–5.1)
Sodium: 137 mmol/L (ref 135–145)

## 2015-08-18 LAB — CBC
HEMATOCRIT: 40.6 % (ref 36.0–46.0)
Hemoglobin: 13.3 g/dL (ref 12.0–15.0)
MCH: 29.9 pg (ref 26.0–34.0)
MCHC: 32.8 g/dL (ref 30.0–36.0)
MCV: 91.2 fL (ref 78.0–100.0)
Platelets: 364 10*3/uL (ref 150–400)
RBC: 4.45 MIL/uL (ref 3.87–5.11)
RDW: 13.9 % (ref 11.5–15.5)
WBC: 10.8 10*3/uL — ABNORMAL HIGH (ref 4.0–10.5)

## 2015-08-18 NOTE — Progress Notes (Signed)
   Patient presented to the office today for preoperative consultation in preparation for next weeks resectoscopic polypectomy as a result of her endometrial polyp and postmenopausal bleeding. See previous note dated 06/04/2015 for detail.   Patient states that approximately 4 weeks ago she had bright red bleeding from her vagina for a few days accompanied by passage of tissue-like material and clots and then it was dark red blood for for 5 days and she's had no further bleeding. She wonders if she still has the polyp or not.  Patient will return back to the office this Wednesday to repeat her sonohysterogram to see if she has had spontaneous passage of the endometrial polyp if so we will cancel her surgery if not we will complete her preoperative exam and consultation at that time.

## 2015-08-18 NOTE — Patient Instructions (Addendum)
   Your procedure is scheduled on: NOV 22 (TUESDAY)  Enter through the Main Entrance of [facility] at: 6AM  Pick up the phone at the desk and dial [phone] and inform us of your arrival.  Please call this number if you have any problems the morning of surgery: [phone]  DO NOT EAT OR DRINK AFTER MIDNIGHT NOV 21 ([place])   Take these medicines the morning of surgery with a SIP OF WATER: TAKE BLOOD PRESSUE MEDS DAY OF SURGERY... DO NOT TAKE METFORMIN DAY OF SURGERY...  Do not wear jewelry, make-up, or FINGER nail polish No metal in your hair or on your body. Do not wear lotions, powders, perfumes.  You may wear deodorant.  Do not bring valuables to the hospital. Contacts, dentures or bridgework may not be worn into surgery.   Patients discharged on the day of surgery will not be allowed to drive home.

## 2015-08-19 ENCOUNTER — Telehealth: Payer: Self-pay | Admitting: Gynecology

## 2015-08-19 NOTE — Telephone Encounter (Signed)
08/19/15-I called pt [employer] and was told that the sonohysterogram would be covered under her $60 copay. This is different from when we did this in August of this year. Verified with agent this is correct. REF [id number]

## 2015-08-20 ENCOUNTER — Ambulatory Visit (INDEPENDENT_AMBULATORY_CARE_PROVIDER_SITE_OTHER): Payer: No Typology Code available for payment source | Admitting: Gynecology

## 2015-08-20 ENCOUNTER — Other Ambulatory Visit: Payer: No Typology Code available for payment source

## 2015-08-20 ENCOUNTER — Ambulatory Visit: Payer: No Typology Code available for payment source | Admitting: Gynecology

## 2015-08-20 ENCOUNTER — Ambulatory Visit (INDEPENDENT_AMBULATORY_CARE_PROVIDER_SITE_OTHER): Payer: No Typology Code available for payment source

## 2015-08-20 ENCOUNTER — Encounter: Payer: Self-pay | Admitting: Gynecology

## 2015-08-20 VITALS — BP 132/78

## 2015-08-20 DIAGNOSIS — N950 Postmenopausal bleeding: Secondary | ICD-10-CM

## 2015-08-20 DIAGNOSIS — N840 Polyp of corpus uteri: Secondary | ICD-10-CM | POA: Diagnosis not present

## 2015-08-20 NOTE — Patient Instructions (Signed)
Hysteroscopy °Hysteroscopy is a procedure used for looking inside the womb (uterus). It may be done for various reasons, including: °· To evaluate abnormal bleeding, fibroid (benign, noncancerous) tumors, polyps, scar tissue (adhesions), and possibly cancer of the uterus. °· To look for lumps (tumors) and other uterine growths. °· To look for causes of why a woman cannot get pregnant (infertility), causes of recurrent loss of pregnancy (miscarriages), or a lost intrauterine device (IUD). °· To perform a sterilization by blocking the fallopian tubes from inside the uterus. °In this procedure, a thin, flexible tube with a tiny light and camera on the end of it (hysteroscope) is used to look inside the uterus. A hysteroscopy should be done right after a menstrual period to be sure you are not pregnant. °LET YOUR HEALTH CARE PROVIDER KNOW ABOUT:  °· Any allergies you have. °· All medicines you are taking, including vitamins, herbs, eye drops, creams, and over-the-counter medicines. °· Previous problems you or members of your family have had with the use of anesthetics. °· Any blood disorders you have. °· Previous surgeries you have had. °· Medical conditions you have. °RISKS AND COMPLICATIONS  °Generally, this is a safe procedure. However, as with any procedure, complications can occur. Possible complications include: °· Putting a hole in the uterus. °· Excessive bleeding. °· Infection. °· Damage to the cervix. °· Injury to other organs. °· Allergic reaction to medicines. °· Too much fluid used in the uterus for the procedure. °BEFORE THE PROCEDURE  °· Ask your health care provider about changing or stopping any regular medicines. °· Do not take aspirin or blood thinners for 1 week before the procedure, or as directed by your health care provider. These can cause bleeding. °· If you smoke, do not smoke for 2 weeks before the procedure. °· In some cases, a medicine is placed in the cervix the day before the procedure.  This medicine makes the cervix have a larger opening (dilate). This makes it easier for the instrument to be inserted into the uterus during the procedure. °· Do not eat or drink anything for at least 8 hours before the surgery. °· Arrange for someone to take you home after the procedure. °PROCEDURE  °· You may be given a medicine to relax you (sedative). You may also be given one of the following: °¨ A medicine that numbs the area around the cervix (local anesthetic). °¨ A medicine that makes you sleep through the procedure (general anesthetic). °· The hysteroscope is inserted through the vagina into the uterus. The camera on the hysteroscope sends a picture to a TV screen. This gives the surgeon a good view inside the uterus. °· During the procedure, air or a liquid is put into the uterus, which allows the surgeon to see better. °· Sometimes, tissue is gently scraped from inside the uterus. These tissue samples are sent to a lab for testing. °AFTER THE PROCEDURE  °· If you had a general anesthetic, you may be groggy for a couple hours after the procedure. °· If you had a local anesthetic, you will be able to go home as soon as you are stable and feel ready. °· You may have some cramping. This normally lasts for a couple days. °· You may have bleeding, which varies from light spotting for a few days to menstrual-like bleeding for 3-7 days. This is normal. °· If your test results are not back during the visit, make an appointment with your health care provider to find out the   results.   This information is not intended to replace advice given to you by your health care provider. Make sure you discuss any questions you have with your health care provider.   Document Released: 12/27/2000 Document Revised: 07/11/2013 Document Reviewed: 04/19/2013 Elsevier Interactive Patient Education 2016 [employer]   

## 2015-08-20 NOTE — Progress Notes (Signed)
[name] is an 59 y.o. female for preoperative consultation and examination before scheduled surgery next week. Patient scheduled for resectoscopic polypectomy is a result of endometrial polyp contributing to [place] menopausal bleeding. Patient was seen on August 31 and my note stated the following:  Patient had been referred to our practice by her primary care physician Dr.Ian McKeag had referred her to [place] after he had done an endometrial biopsy and his office which demonstrated the following:  Diagnosis Endometrium, biopsy - ATROPHIC APPEARING ENDOMETRIUM. - THERE IS NO EVIDENCE OF HYPERPLASIA OR MALIGNANCY.  On July 12 a pelvic ultrasound had been ordered with the following report noted: FINDINGS: Uterus  Measurements: 8.0 x 4.4 x 5.1 cm. Retroverted without focal mass.  Endometrium  Thickness: 7.6 mm. Heterogeneous with mobile internal echoes. There is increased vascularity within the endometrium. No focal mass identified.  Right ovary  Measurements: The ovary is not visualized, and may be absent or obscured. No adnexal mass identified.  Left ovary  Measurements: The ovary is not visualized, either absent or obscured . No adnexal mass identified.  She had a Pap smear here in the office on last visit and she's here for sonohysterogram. Patient was counseled for the procedure. The cervix was cleansed with Betadine solution and a sterile catheter was introduced into the uterine cavity and a posterior right endometrial polyp measuring 22 x 6 x 9 millimeter was noted. Right and left ovary were atrophic. Endometrial stripe 6.3 mm as a result of endometrial polyp. Uterus measures 7.0 x 5.5 x 4.5 cm.  She was here in the office a few days ago for preop examination and had voiced that few weeks prior she had had a large amount of clots and tissue and she had been wondering if she had passed the endometrial polyp. For this reason she was asked to return today to do a  sonohysterogram. The ultrasound demonstrated the following:  Ultrasound demonstrated uterus measures 7.1 x 5.2 x 4.3 cm with endometrial stripe of 10.5 mm. Right ovary not seen. No adnexal masses. Left ovary atrophic and normal. No fluid in the cul-de-sac. The cervix was cleansed with Betadine solution and sterile catheter was introduced into the uterine cavity and a uterine polyp was still present and measured 29 x 8 x 7 mm anterior left uterine wall.  Pertinent Gynecological History: Menses: post-menopausal Bleeding: post menopausal bleeding Contraception: post menopausal status DES exposure: denies Blood transfusions: none Sexually transmitted diseases: Gonorrhea and chlamydia many years ago Previous GYN Procedures: 2 normal spontaneous vaginal deliveries and 1 D&C  Last mammogram: normal Date: 2015 Last pap: normal Date: 2016 OB History: G 3, P2 A1   Menstrual History: Menarche age: 55 No LMP recorded. Patient is postmenopausal.    Past Medical History  Diagnosis Date  . Hypertension   . Diabetes mellitus without complication ([place])   . GERD (gastroesophageal reflux disease)     Past Surgical History  Procedure Laterality Date  . Tubal ligation      Family History  Problem Relation Age of Onset  . Heart disease Mother   . Breast cancer Paternal Grandmother   . Breast cancer Paternal Aunt   . Hypertension Father   . Hypertension Sister   . Hypertension Brother     Social History:  reports that she has quit smoking. She has never used smokeless tobacco. She reports that she drinks alcohol. She reports that she does not use illicit drugs.  Allergies:  Allergies  Allergen Reactions  . Amoxicillin  Hives  . Lovastatin     REACTION: leg cramps  . Penicillins Hives    Has patient had a PCN reaction causing immediate rash, facial/tongue/throat swelling, SOB or lightheadedness with hypotension: No Has patient had a PCN reaction causing severe rash involving mucus  membranes or skin necrosis: No Has patient had a PCN reaction that required hospitalization No Has patient had a PCN reaction occurring within the last 10 years: No If all of the above answers are "NO", then may proceed with Cephalosporin use.      (Not in a hospital admission)  REVIEW OF SYSTEMS: A ROS was performed and pertinent positives and negatives are included in the history.  GENERAL: No fevers or chills. HEENT: No change in vision, no earache, sore throat or sinus congestion. NECK: No pain or stiffness. CARDIOVASCULAR: No chest pain or pressure. No palpitations. PULMONARY: No shortness of breath, cough or wheeze. GASTROINTESTINAL: No abdominal pain, nausea, vomiting or diarrhea, melena or bright red blood per rectum. GENITOURINARY: No urinary frequency, urgency, hesitancy or dysuria. MUSCULOSKELETAL: No joint or muscle pain, no back pain, no recent trauma. DERMATOLOGIC: No rash, no itching, no lesions. ENDOCRINE: No polyuria, polydipsia, no heat or cold intolerance. No recent change in weight. HEMATOLOGICAL: No anemia or easy bruising or bleeding. NEUROLOGIC: No headache, seizures, numbness, tingling or weakness. PSYCHIATRIC: No depression, no loss of interest in normal activity or change in sleep pattern.     Blood pressure 132/78.  Physical Exam:  HEENT:unremarkable Neck:Supple, midline, no thyroid megaly, no carotid bruits Lungs:  Clear to auscultation no rhonchi's or wheezes Heart:Regular rate and rhythm, no murmurs or gallops Breast Exam: Not done Abdomen: Soft nontender no rebound guarding Pelvic:BUS within normal limits Vagina: No lesions or discharge Cervix: No lesions or discharge Uterus: 8 weeks size nontender mobile Adnexa: No masses or tenderness Extremities: No cords, no edema Rectal: Not done   Assessment/Plan: Postmenopausal patient with irregular bleeding. Workup has consisted of a benign endometrial biopsy but so hysterogram demonstrated endometrial polyp.  Patient scheduled to undergo resectoscopic polypectomy next week. The following risk of surgery were discussed with the patient and all questions were answered:                        Patient was counseled as to the risk of surgery to include the following:  1. Infection (prohylactic antibiotics will be administered)  2. DVT/Pulmonary Embolism (prophylactic pneumo compression stockings will be used)  3.Trauma to internal organs requiring additional surgical procedure to repair any injury to     Internal organs requiring perhaps additional hospitalization days.  4.Hemmorhage requiring transfusion and blood products which carry risks such as             anaphylactic reaction, hepatitis and AIDS  Patient had received literature information on the procedure scheduled and all her questions were answered and fully accepts all risk.   [facility] HMD8:59 AMTD@Note     [name] 08/20/2015, 8:42 AM  Note: This dictation was prepared with  Dragon/digital dictation along withSmart phrase technology. Any transcriptional errors that result from this process are unintentional.

## 2015-08-21 NOTE — Anesthesia Preprocedure Evaluation (Addendum)
Anesthesia Evaluation  Patient identified by MRN, date of birth, ID band Patient awake    Reviewed: Allergy & Precautions, NPO status , Patient's Chart, lab work & pertinent test results  Airway Mallampati: II       Dental  (+) Teeth Intact, Dental Advisory Given   Pulmonary former smoker (quit 15 years ago),    breath sounds clear to auscultation       Cardiovascular hypertension, Pt. on medications  Rhythm:Regular  ECHO 2009 EF 70%   Neuro/Psych Depression    GI/Hepatic GERD  ,  Endo/Other  diabetes, Poorly Controlled, Type 2, Oral Hypoglycemic AgentsMorbid obesity  Renal/GU    Endometrial polyp    Musculoskeletal   Abdominal (+) + obese,   Peds  Hematology 13/40   Anesthesia Other Findings   Reproductive/Obstetrics                            Anesthesia Physical Anesthesia Plan  ASA: III  Anesthesia Plan: General   Post-op Pain Management:    Induction: Intravenous  Airway Management Planned: Oral ETT and Video Laryngoscope Planned  Additional Equipment:   Intra-op Plan:   Post-operative Plan: Extubation in OR  Informed Consent: I have reviewed the patients History and Physical, chart, labs and discussed the procedure including the risks, benefits and alternatives for the proposed anesthesia with the patient or authorized representative who has indicated his/her understanding and acceptance.     Plan Discussed with:   Anesthesia Plan Comments:         Anesthesia Quick Evaluation

## 2015-08-25 MED ORDER — GENTAMICIN SULFATE 40 MG/ML IJ SOLN
INTRAVENOUS | Status: AC
  Administered 2015-08-26: 116 mL via INTRAVENOUS
  Filled 2015-08-25: qty 10

## 2015-08-26 ENCOUNTER — Ambulatory Visit (HOSPITAL_COMMUNITY): Payer: No Typology Code available for payment source | Admitting: Anesthesiology

## 2015-08-26 ENCOUNTER — Ambulatory Visit (HOSPITAL_COMMUNITY)
Admission: RE | Admit: 2015-08-26 | Discharge: 2015-08-26 | Disposition: A | Discharge status: Home / Self Care | Payer: No Typology Code available for payment source | Source: Ambulatory Visit | Attending: Gynecology | Admitting: Gynecology

## 2015-08-26 ENCOUNTER — Encounter (HOSPITAL_COMMUNITY): Payer: Self-pay | Admitting: *Deleted

## 2015-08-26 ENCOUNTER — Encounter (HOSPITAL_COMMUNITY)
Admission: RE | Disposition: A | Discharge status: Home / Self Care | Payer: Self-pay | Source: Ambulatory Visit | Attending: Gynecology

## 2015-08-26 ENCOUNTER — Telehealth: Payer: Self-pay | Admitting: *Deleted

## 2015-08-26 DIAGNOSIS — K219 Gastro-esophageal reflux disease without esophagitis: Secondary | ICD-10-CM | POA: Insufficient documentation

## 2015-08-26 DIAGNOSIS — Z87891 Personal history of nicotine dependence: Secondary | ICD-10-CM | POA: Diagnosis not present

## 2015-08-26 DIAGNOSIS — N840 Polyp of corpus uteri: Secondary | ICD-10-CM

## 2015-08-26 DIAGNOSIS — Z880 Allergy status to penicillin: Secondary | ICD-10-CM | POA: Insufficient documentation

## 2015-08-26 DIAGNOSIS — Z6841 Body Mass Index (BMI) 40.0 and over, adult: Secondary | ICD-10-CM | POA: Diagnosis not present

## 2015-08-26 DIAGNOSIS — N950 Postmenopausal bleeding: Secondary | ICD-10-CM | POA: Insufficient documentation

## 2015-08-26 DIAGNOSIS — I10 Essential (primary) hypertension: Secondary | ICD-10-CM | POA: Diagnosis not present

## 2015-08-26 DIAGNOSIS — E119 Type 2 diabetes mellitus without complications: Secondary | ICD-10-CM | POA: Insufficient documentation

## 2015-08-26 HISTORY — PX: DILATATION & CURETTAGE/HYSTEROSCOPY WITH MYOSURE: SHX6511

## 2015-08-26 LAB — GLUCOSE, CAPILLARY
GLUCOSE-CAPILLARY: 282 mg/dL — AB (ref 65–99)
Glucose-Capillary: 184 mg/dL — ABNORMAL HIGH (ref 65–99)

## 2015-08-26 SURGERY — DILATATION & CURETTAGE/HYSTEROSCOPY WITH MYOSURE
Anesthesia: General

## 2015-08-26 MED ORDER — SODIUM CHLORIDE 0.9 % IJ SOLN
INTRAMUSCULAR | Status: AC
  Filled 2015-08-26: qty 50

## 2015-08-26 MED ORDER — KETOROLAC TROMETHAMINE 30 MG/ML IJ SOLN
INTRAMUSCULAR | Status: AC
  Filled 2015-08-26: qty 1

## 2015-08-26 MED ORDER — MIDAZOLAM HCL 2 MG/2ML IJ SOLN
INTRAMUSCULAR | Status: AC
  Filled 2015-08-26: qty 2

## 2015-08-26 MED ORDER — MEPERIDINE HCL 25 MG/ML IJ SOLN: 6.2500 mg | INTRAMUSCULAR | Status: DC | PRN

## 2015-08-26 MED ORDER — IBUPROFEN 800 MG PO TABS: 800.0000 mg | ORAL_TABLET | Freq: Three times a day (TID) | ORAL | Status: DC | PRN

## 2015-08-26 MED ORDER — MIDAZOLAM HCL 2 MG/2ML IJ SOLN
INTRAMUSCULAR | Status: DC | PRN
  Administered 2015-08-26: 1 mg via INTRAVENOUS

## 2015-08-26 MED ORDER — ACETAMINOPHEN 325 MG PO TABS
ORAL_TABLET | ORAL | Status: AC
  Filled 2015-08-26: qty 2

## 2015-08-26 MED ORDER — PROPOFOL 10 MG/ML IV BOLUS
INTRAVENOUS | Status: AC
  Filled 2015-08-26: qty 20

## 2015-08-26 MED ORDER — INSULIN ASPART 100 UNIT/ML ~~LOC~~ SOLN
SUBCUTANEOUS | Status: DC | PRN
  Administered 2015-08-26: 5 [IU] via INTRAVENOUS

## 2015-08-26 MED ORDER — ACETAMINOPHEN 325 MG PO TABS
650.0000 mg | ORAL_TABLET | Freq: Once | ORAL | Status: AC
  Administered 2015-08-26: 650 mg via ORAL

## 2015-08-26 MED ORDER — PHENYLEPHRINE HCL 10 MG/ML IJ SOLN
INTRAMUSCULAR | Status: DC | PRN
  Administered 2015-08-26 (×2): 80 ug via INTRAVENOUS
  Administered 2015-08-26: 40 ug via INTRAVENOUS

## 2015-08-26 MED ORDER — PHENYLEPHRINE 40 MCG/ML (10ML) SYRINGE FOR IV PUSH (FOR BLOOD PRESSURE SUPPORT)
PREFILLED_SYRINGE | INTRAVENOUS | Status: AC
  Filled 2015-08-26: qty 10

## 2015-08-26 MED ORDER — INSULIN ASPART 100 UNIT/ML ~~LOC~~ SOLN
SUBCUTANEOUS | Status: AC
  Filled 2015-08-26: qty 1

## 2015-08-26 MED ORDER — ROCURONIUM BROMIDE 100 MG/10ML IV SOLN
INTRAVENOUS | Status: AC
  Filled 2015-08-26: qty 1

## 2015-08-26 MED ORDER — FENTANYL CITRATE (PF) 100 MCG/2ML IJ SOLN: 25.0000 ug | INTRAMUSCULAR | Status: DC | PRN

## 2015-08-26 MED ORDER — SUCCINYLCHOLINE CHLORIDE 20 MG/ML IJ SOLN
INTRAMUSCULAR | Status: AC
  Filled 2015-08-26: qty 1

## 2015-08-26 MED ORDER — FENTANYL CITRATE (PF) 250 MCG/5ML IJ SOLN
INTRAMUSCULAR | Status: AC
  Filled 2015-08-26: qty 5

## 2015-08-26 MED ORDER — PROMETHAZINE HCL 25 MG/ML IJ SOLN: 6.2500 mg | INTRAMUSCULAR | Status: DC | PRN

## 2015-08-26 MED ORDER — EPHEDRINE 5 MG/ML INJ
INTRAVENOUS | Status: AC
  Filled 2015-08-26: qty 10

## 2015-08-26 MED ORDER — EPHEDRINE SULFATE 50 MG/ML IJ SOLN
INTRAMUSCULAR | Status: DC | PRN
  Administered 2015-08-26 (×2): 10 mg via INTRAVENOUS

## 2015-08-26 MED ORDER — VASOPRESSIN 20 UNIT/ML IV SOLN
INTRAVENOUS | Status: AC
  Filled 2015-08-26: qty 1

## 2015-08-26 MED ORDER — SILVER NITRATE-POT NITRATE 75-25 % EX MISC
CUTANEOUS | Status: DC | PRN
  Administered 2015-08-26: 2

## 2015-08-26 MED ORDER — DEXAMETHASONE SODIUM PHOSPHATE 4 MG/ML IJ SOLN
INTRAMUSCULAR | Status: AC
  Filled 2015-08-26: qty 1

## 2015-08-26 MED ORDER — FENTANYL CITRATE (PF) 100 MCG/2ML IJ SOLN
INTRAMUSCULAR | Status: DC | PRN
  Administered 2015-08-26 (×2): 50 ug via INTRAVENOUS

## 2015-08-26 MED ORDER — SILVER NITRATE-POT NITRATE 75-25 % EX MISC
CUTANEOUS | Status: AC
  Filled 2015-08-26: qty 1

## 2015-08-26 MED ORDER — LIDOCAINE HCL (CARDIAC) 20 MG/ML IV SOLN
INTRAVENOUS | Status: DC | PRN
  Administered 2015-08-26: 60 mg via INTRAVENOUS

## 2015-08-26 MED ORDER — ONDANSETRON HCL 4 MG/2ML IJ SOLN
INTRAMUSCULAR | Status: AC
  Filled 2015-08-26: qty 2

## 2015-08-26 MED ORDER — SCOPOLAMINE 1 MG/3DAYS TD PT72
1.0000 | MEDICATED_PATCH | Freq: Once | TRANSDERMAL | Status: DC
  Administered 2015-08-26: 1.5 mg via TRANSDERMAL

## 2015-08-26 MED ORDER — ONDANSETRON HCL 4 MG/2ML IJ SOLN
INTRAMUSCULAR | Status: DC | PRN
  Administered 2015-08-26: 4 mg via INTRAVENOUS

## 2015-08-26 MED ORDER — PROPOFOL 10 MG/ML IV BOLUS
INTRAVENOUS | Status: DC | PRN
  Administered 2015-08-26: 250 mg via INTRAVENOUS

## 2015-08-26 MED ORDER — SCOPOLAMINE 1 MG/3DAYS TD PT72
MEDICATED_PATCH | TRANSDERMAL | Status: AC
  Filled 2015-08-26: qty 1

## 2015-08-26 MED ORDER — LIDOCAINE HCL (CARDIAC) 20 MG/ML IV SOLN
INTRAVENOUS | Status: AC
  Filled 2015-08-26: qty 5

## 2015-08-26 MED ORDER — SUCCINYLCHOLINE CHLORIDE 20 MG/ML IJ SOLN
INTRAMUSCULAR | Status: DC | PRN
  Administered 2015-08-26: 140 mg via INTRAVENOUS

## 2015-08-26 MED ORDER — LACTATED RINGERS IV SOLN
INTRAVENOUS | Status: DC
  Administered 2015-08-26: 10 mL/h via INTRAVENOUS

## 2015-08-26 SURGICAL SUPPLY — 24 items
CANISTERS HI-FLOW 3000CC (CANNISTER) ×2 IMPLANT
CATH FOLEY 2WAY SLVR 30CC 16FR (CATHETERS) IMPLANT
CATH ROBINSON RED A/P 16FR (CATHETERS) ×2 IMPLANT
CLOTH BEACON ORANGE TIMEOUT ST (SAFETY) ×2 IMPLANT
CONTAINER PREFILL 10% NBF 60ML (FORM) ×4 IMPLANT
DEVICE MYOSURE LITE (MISCELLANEOUS) IMPLANT
DEVICE MYOSURE REACH (MISCELLANEOUS) ×2 IMPLANT
FILTER ARTHROSCOPY CONVERTOR (FILTER) ×2 IMPLANT
GLOVE BIOGEL PI IND STRL 7.0 (GLOVE) ×1 IMPLANT
GLOVE BIOGEL PI IND STRL 8 (GLOVE) ×1 IMPLANT
GLOVE BIOGEL PI INDICATOR 7.0 (GLOVE) ×1
GLOVE BIOGEL PI INDICATOR 8 (GLOVE) ×1
GLOVE ECLIPSE 7.5 STRL STRAW (GLOVE) ×4 IMPLANT
GOWN STRL REUS W/TWL LRG LVL3 (GOWN DISPOSABLE) ×4 IMPLANT
PACK VAGINAL MINOR WOMEN LF (CUSTOM PROCEDURE TRAY) ×2 IMPLANT
PAD OB MATERNITY 4.3X12.25 (PERSONAL CARE ITEMS) ×2 IMPLANT
PAD PREP 24X48 CUFFED NSTRL (MISCELLANEOUS) ×2 IMPLANT
PLUG CATH AND CAP STER (CATHETERS) IMPLANT
SEAL ROD LENS SCOPE MYOSURE (ABLATOR) ×2 IMPLANT
SYR 30ML LL (SYRINGE) IMPLANT
TOWEL OR 17X24 6PK STRL BLUE (TOWEL DISPOSABLE) ×4 IMPLANT
TUBING AQUILEX INFLOW (TUBING) ×2 IMPLANT
TUBING AQUILEX OUTFLOW (TUBING) ×2 IMPLANT
WATER STERILE IRR 1000ML POUR (IV SOLUTION) ×2 IMPLANT

## 2015-08-26 NOTE — H&P (View-Only) (Signed)
[name] is an 59 y.o. female for preoperative consultation and examination before scheduled surgery next week. Patient scheduled for resectoscopic polypectomy is a result of endometrial polyp contributing to [place] menopausal bleeding. Patient was seen on August 31 and my note stated the following:  Patient had been referred to our practice by her primary care physician Dr.Ian Diaz had referred her to [place] after he had done an endometrial biopsy and his office which demonstrated the following:  Diagnosis Endometrium, biopsy - ATROPHIC APPEARING ENDOMETRIUM. - THERE IS NO EVIDENCE OF HYPERPLASIA OR MALIGNANCY.  On July 12 a pelvic ultrasound had been ordered with the following report noted: FINDINGS: Uterus  Measurements: 8.0 x 4.4 x 5.1 cm. Retroverted without focal mass.  Endometrium  Thickness: 7.6 mm. Heterogeneous with mobile internal echoes. There is increased vascularity within the endometrium. No focal mass identified.  Right ovary  Measurements: The ovary is not visualized, and may be absent or obscured. No adnexal mass identified.  Left ovary  Measurements: The ovary is not visualized, either absent or obscured . No adnexal mass identified.  She had a Pap smear here in the office on last visit and she's here for sonohysterogram. Patient was counseled for the procedure. The cervix was cleansed with Betadine solution and a sterile catheter was introduced into the uterine cavity and a posterior right endometrial polyp measuring 22 x 6 x 9 millimeter was noted. Right and left ovary were atrophic. Endometrial stripe 6.3 mm as a result of endometrial polyp. Uterus measures 7.0 x 5.5 x 4.5 cm.  She was here in the office a few days ago for preop examination and had voiced that few weeks prior she had had a large amount of clots and tissue and she had been wondering if she had passed the endometrial polyp. For this reason she was asked to return today to do a  sonohysterogram. The ultrasound demonstrated the following:  Ultrasound demonstrated uterus measures 7.1 x 5.2 x 4.3 cm with endometrial stripe of 10.5 mm. Right ovary not seen. No adnexal masses. Left ovary atrophic and normal. No fluid in the cul-de-sac. The cervix was cleansed with Betadine solution and sterile catheter was introduced into the uterine cavity and a uterine polyp was still present and measured 29 x 8 x 7 mm anterior left uterine wall.  Pertinent Gynecological History: Menses: post-menopausal Bleeding: post menopausal bleeding Contraception: post menopausal status DES exposure: denies Blood transfusions: none Sexually transmitted diseases: Gonorrhea and chlamydia many years ago Previous GYN Procedures: 2 normal spontaneous vaginal deliveries and 1 D&C  Last mammogram: normal Date: 2015 Last pap: normal Date: 2016 OB History: G 3, P2 A1   Menstrual History: Menarche age: 28 No LMP recorded. Patient is postmenopausal.    Past Medical History  Diagnosis Date  . Hypertension   . Diabetes mellitus without complication ([place])   . GERD (gastroesophageal reflux disease)     Past Surgical History  Procedure Laterality Date  . Tubal ligation      Family History  Problem Relation Age of Onset  . Heart disease Mother   . Breast cancer Paternal Grandmother   . Breast cancer Paternal Aunt   . Hypertension Father   . Hypertension Sister   . Hypertension Brother     Social History:  reports that she has quit smoking. She has never used smokeless tobacco. She reports that she drinks alcohol. She reports that she does not use illicit drugs.  Allergies:  Allergies  Allergen Reactions  . Amoxicillin  Hives  . Lovastatin     REACTION: leg cramps  . Penicillins Hives    Has patient had a PCN reaction causing immediate rash, facial/tongue/throat swelling, SOB or lightheadedness with hypotension: No Has patient had a PCN reaction causing severe rash involving mucus  membranes or skin necrosis: No Has patient had a PCN reaction that required hospitalization No Has patient had a PCN reaction occurring within the last 10 years: No If all of the above answers are "NO", then may proceed with Cephalosporin use.      (Not in a hospital admission)  REVIEW OF SYSTEMS: A ROS was performed and pertinent positives and negatives are included in the history.  GENERAL: No fevers or chills. HEENT: No change in vision, no earache, sore throat or sinus congestion. NECK: No pain or stiffness. CARDIOVASCULAR: No chest pain or pressure. No palpitations. PULMONARY: No shortness of breath, cough or wheeze. GASTROINTESTINAL: No abdominal pain, nausea, vomiting or diarrhea, melena or bright red blood per rectum. GENITOURINARY: No urinary frequency, urgency, hesitancy or dysuria. MUSCULOSKELETAL: No joint or muscle pain, no back pain, no recent trauma. DERMATOLOGIC: No rash, no itching, no lesions. ENDOCRINE: No polyuria, polydipsia, no heat or cold intolerance. No recent change in weight. HEMATOLOGICAL: No anemia or easy bruising or bleeding. NEUROLOGIC: No headache, seizures, numbness, tingling or weakness. PSYCHIATRIC: No depression, no loss of interest in normal activity or change in sleep pattern.     Blood pressure 132/78.  Physical Exam:  HEENT:unremarkable Neck:Supple, midline, no thyroid megaly, no carotid bruits Lungs:  Clear to auscultation no rhonchi's or wheezes Heart:Regular rate and rhythm, no murmurs or gallops Breast Exam: Not done Abdomen: Soft nontender no rebound guarding Pelvic:BUS within normal limits Vagina: No lesions or discharge Cervix: No lesions or discharge Uterus: 8 weeks size nontender mobile Adnexa: No masses or tenderness Extremities: No cords, no edema Rectal: Not done   Assessment/Plan: Postmenopausal patient with irregular bleeding. Workup has consisted of a benign endometrial biopsy but so hysterogram demonstrated endometrial polyp.  Patient scheduled to undergo resectoscopic polypectomy next week. The following risk of surgery were discussed with the patient and all questions were answered:                        Patient was counseled as to the risk of surgery to include the following:  1. Infection (prohylactic antibiotics will be administered)  2. DVT/Pulmonary Embolism (prophylactic pneumo compression stockings will be used)  3.Trauma to internal organs requiring additional surgical procedure to repair any injury to     Internal organs requiring perhaps additional hospitalization days.  4.Hemmorhage requiring transfusion and blood products which carry risks such as             anaphylactic reaction, hepatitis and AIDS  Patient had received literature information on the procedure scheduled and all her questions were answered and fully accepts all risk.   [facility] HMD8:59 AMTD@Note     [name] 08/20/2015, 8:42 AM  Note: This dictation was prepared with  Dragon/digital dictation along withSmart phrase technology. Any transcriptional errors that result from this process are unintentional.

## 2015-08-26 NOTE — Anesthesia Postprocedure Evaluation (Signed)
Anesthesia Post Note  Patient: [employer]  Procedure(s) Performed: Procedure(s) (LRB): DILATATION & CURETTAGE/HYSTEROSCOPY WITH MYOSURE (N/A)  Patient location during evaluation: PACU Anesthesia Type: General Level of consciousness: awake and alert Pain management: pain level controlled Vital Signs Assessment: post-procedure vital signs reviewed and stable Respiratory status: spontaneous breathing, nonlabored ventilation, respiratory function stable and patient connected to nasal cannula oxygen Cardiovascular status: blood pressure returned to baseline and stable Postop Assessment: No signs of nausea or vomiting Anesthetic complications: no    Last Vitals:  Filed Vitals:   08/26/15 0812 08/26/15 0815  BP: 117/62 119/61  Pulse: 68 68  Temp: 36.4 C   Resp: 16 16    Last Pain: There were no vitals filed for this visit.               Paymon Rosensteel

## 2015-08-26 NOTE — Telephone Encounter (Signed)
She already has Mobic she cant take both. Call in Motrin 800 mg TID PRN with food # 30

## 2015-08-26 NOTE — Transfer of Care (Signed)
Immediate Anesthesia Transfer of Care Note  Patient: [name]  Procedure(s) Performed: Procedure(s): DILATATION & CURETTAGE/HYSTEROSCOPY WITH MYOSURE (N/A)  Patient Location: PACU  Anesthesia Type:General  Level of Consciousness: awake, alert , oriented and patient cooperative  Airway & Oxygen Therapy: Patient Spontanous Breathing and Patient connected to nasal cannula oxygen  Post-op Assessment: Report given to RN and Post -op Vital signs reviewed and stable  Post vital signs: Reviewed and stable  Last Vitals:  Filed Vitals:   08/26/15 0607  BP: 132/66  Pulse: 72  Temp: 36.5 C  Resp: 18    Complications: No apparent anesthesia complications

## 2015-08-26 NOTE — Telephone Encounter (Signed)
Pt had surgery this am D&C states you were going to send Rx in for Motrin 800 mg to her pharmacy. Please advise

## 2015-08-26 NOTE — Interval H&P Note (Signed)
History and Physical Interval Note:  08/26/2015 7:17 AM  [name]  has presented today for surgery, with the diagnosis of post menopausal bleeding  The various methods of treatment have been discussed with the patient and family. After consideration of risks, benefits and other options for treatment, the patient has consented to  Procedure(s): [place] (N/A) as a surgical intervention .  The patient's history has been reviewed, patient examined, no change in status, stable for surgery.  I have reviewed the patient's chart and labs.  Questions were answered to the patient's satisfaction.     [name]

## 2015-08-26 NOTE — Anesthesia Procedure Notes (Signed)
Procedure Name: Intubation Date/Time: 08/26/2015 7:35 AM Performed by: Raenette Rover Pre-anesthesia Checklist: Patient identified, Emergency Drugs available, Suction available and Patient being monitored Patient Re-evaluated:Patient Re-evaluated prior to inductionOxygen Delivery Method: Circle system utilized Preoxygenation: Pre-oxygenation with 100% oxygen Intubation Type: IV induction and Rapid sequence Ventilation: Mask ventilation without difficulty Laryngoscope Size: Mac and 3 Grade View: Grade II Tube type: Oral Tube size: 7.0 mm Number of attempts: 1 Airway Equipment and Method: Patient positioned with wedge pillow and Stylet Placement Confirmation: ETT inserted through vocal cords under direct vision,  positive ETCO2,  CO2 detector and breath sounds checked- equal and bilateral Secured at: 21 cm Tube secured with: Tape Dental Injury: Teeth and Oropharynx as per pre-operative assessment

## 2015-08-26 NOTE — Telephone Encounter (Signed)
Pt aware Rx sent, pt aware

## 2015-08-26 NOTE — Op Note (Signed)
   Operative Note  08/26/2015  8:30 AM  PATIENT:  [name]  59 y.o. female  PRE-OPERATIVE DIAGNOSIS:  post menopausal bleeding  POST-OPERATIVE DIAGNOSIS:  post menopausal bleeding  PROCEDURE:  Procedure(s): DILATATION & CURETTAGE/HYSTEROSCOPY WITH MYOSURE  SURGEON:  Surgeon(s): [name], MD  ANESTHESIA:   general  FINDINGS: Large endometrial polyp attached to the fundus right uterine wall extending to the internal cervical os   DESCRIPTION OF OPERATION:FINDINGS: The patient was taken to the operating room where she underwent a successful general endotracheal anesthesia. Patient had PAS stockings for DVT prophylaxis. She received ampicillin and gentamicin prophylactically because of her allergies. Time out was undertaken to properly identify the patient and the proper operation schedule. The vagina and perineum were prepped and draped in usual sterile fashion. A red rubber Quentin Cornwall was inserted to empty the bladder is content for approximately 50 cc. Bimanual examination demonstrated an anteverted uterus. Patient's legs were in the high lithotomy position. A weighted speculum was placed in the posterior vaginal vault. A single-tooth tenaculum was placed on the anterior cervical lip. The uterus sounded to 7-1/2 cm. Pratt dilator were used to dilate the cervical canal to a 19 mm size. The Hologic Myosure resectoscopic morcellator with a scope of 6.25 mm and an operating blade of 3.0 mm was introduced into the intrauterine cavity. 0.9% normal saline was the distending media. Inspection of the endometrial cavity demonstrated a long endometrial polyp attached to the fundus of the uterus right sidewall extending to the internal cervical canal and vascular. With the resectoscopic morcellator the large polyp was removed and submitted for histological evaluation. Fluid deficit was approximately 100 cc. The single-tooth tenaculum was removed patient was extubated transferred to recovery room  stable vital signs blood loss was minimal. She received 30 mg of Toradol in route to the recovery room. Patient did receive 5 units of insulin before surgery as a result of her elevated blood sugar of 282. We'll check her blood sugar in the recovery room.    ESTIMATED BLOOD LOSS: Minimal   Intake/Output Summary (Last 24 hours) at 08/26/15 0830 Last data filed at 08/26/15 0809  Gross per 24 hour  Intake    700 ml  Output     55 ml  Net    645 ml     BLOOD ADMINISTERED:none   LOCAL MEDICATIONS USED:  NONE  SPECIMEN:  Source of Specimen:  Endometrial polyp and curettings  DISPOSITION OF SPECIMEN:  PATHOLOGY  COUNTS:  YES  PLAN OF CARE: Transfer to PACU  [facility] HMD8:30 AMTD@

## 2015-09-01 ENCOUNTER — Other Ambulatory Visit: Payer: Self-pay | Admitting: Family Medicine

## 2015-09-01 ENCOUNTER — Encounter (HOSPITAL_COMMUNITY): Payer: Self-pay | Admitting: Gynecology

## 2015-09-09 ENCOUNTER — Ambulatory Visit: Payer: No Typology Code available for payment source | Admitting: Gynecology

## 2015-09-10 ENCOUNTER — Encounter: Payer: Self-pay | Admitting: Gynecology

## 2015-09-10 ENCOUNTER — Ambulatory Visit (INDEPENDENT_AMBULATORY_CARE_PROVIDER_SITE_OTHER): Payer: 59 | Admitting: Gynecology

## 2015-09-10 VITALS — BP 132/78

## 2015-09-10 DIAGNOSIS — Z09 Encounter for follow-up examination after completed treatment for conditions other than malignant neoplasm: Secondary | ICD-10-CM

## 2015-09-10 NOTE — Progress Notes (Signed)
   Patient is a 59 year old who presented to the office for her postop visit. On 08/26/2015 patient underwent a resectoscopic polypectomy as a result of a large endometrial polyp contributing to postmenopausal bleeding. Patient doing well. Patient with no GU or GI complaints. Patient reports no vaginal bleeding. Findings from surgery as well as pictures and pathology report were shared with the patient.  FINDINGS: Large endometrial polyp attached to the fundus right uterine wall extending to the internal cervical os  Pathology report: Diagnosis Endometrial polyp - ENDOMETRIOID TYPE POLYP(S). - THERE IS NO EVIDENCE OF HYPERPLASIA OR MALIGNANCY  Exam: Abdomen: Soft nontender no rebound or guarding Pelvic: Bartholin urethra Skene was within normal limits Vagina: No lesions or discharge Cervix: No lesions or discharge Uterus: Anteverted normal size shape and consistency Adnexa: No palpable masses or tenderness Rectal exam: Not done  Assessment/plan: Patient status post resectoscopic polypectomy 2 weeks ago as a result of endometrial polyp contributing to postmenopausal bleeding patient doing well pathology report benign. Patient scheduled to return back next year for her annual exam or when necessary. She was provided with a requisition to schedule her overdue mammogram.

## 2015-10-11 ENCOUNTER — Other Ambulatory Visit: Payer: Self-pay | Admitting: Family Medicine

## 2016-01-29 ENCOUNTER — Ambulatory Visit: Payer: No Typology Code available for payment source | Admitting: Family Medicine

## 2016-01-30 ENCOUNTER — Ambulatory Visit (INDEPENDENT_AMBULATORY_CARE_PROVIDER_SITE_OTHER): Payer: Self-pay | Admitting: Family Medicine

## 2016-01-30 VITALS — BP 150/68 | HR 75 | Temp 98.4°F | Wt 266.0 lb

## 2016-01-30 DIAGNOSIS — E669 Obesity, unspecified: Secondary | ICD-10-CM

## 2016-01-30 DIAGNOSIS — K219 Gastro-esophageal reflux disease without esophagitis: Secondary | ICD-10-CM

## 2016-01-30 DIAGNOSIS — E119 Type 2 diabetes mellitus without complications: Secondary | ICD-10-CM

## 2016-01-30 DIAGNOSIS — I10 Essential (primary) hypertension: Secondary | ICD-10-CM

## 2016-01-30 DIAGNOSIS — E7800 Pure hypercholesterolemia, unspecified: Secondary | ICD-10-CM

## 2016-01-30 LAB — POCT GLYCOSYLATED HEMOGLOBIN (HGB A1C): Hemoglobin A1C: 9.6

## 2016-01-30 MED ORDER — CHLORTHALIDONE 50 MG PO TABS: 50.0000 mg | ORAL_TABLET | Freq: Every day | ORAL | Status: DC

## 2016-01-30 MED ORDER — OMEPRAZOLE 20 MG PO CPDR: 20.0000 mg | DELAYED_RELEASE_CAPSULE | Freq: Every day | ORAL | Status: DC

## 2016-01-30 MED ORDER — METFORMIN HCL 500 MG PO TABS: ORAL_TABLET | ORAL | Status: DC

## 2016-01-30 MED ORDER — HYDROCODONE-ACETAMINOPHEN 5-325 MG PO TABS: 1.0000 | ORAL_TABLET | Freq: Four times a day (QID) | ORAL | Status: DC | PRN

## 2016-01-30 MED ORDER — PRAVASTATIN SODIUM 20 MG PO TABS: 40.0000 mg | ORAL_TABLET | Freq: Every day | ORAL | Status: DC

## 2016-01-30 MED ORDER — TRAZODONE HCL 50 MG PO TABS: 50.0000 mg | ORAL_TABLET | Freq: Every evening | ORAL | Status: DC | PRN

## 2016-01-30 MED ORDER — METOPROLOL TARTRATE 50 MG PO TABS: 50.0000 mg | ORAL_TABLET | Freq: Two times a day (BID) | ORAL | Status: DC

## 2016-01-30 MED ORDER — MELOXICAM 7.5 MG PO TABS: 7.5000 mg | ORAL_TABLET | Freq: Two times a day (BID) | ORAL | Status: DC

## 2016-01-30 NOTE — Progress Notes (Signed)
HPI  CC: Diabetes follow-up and medication refill. Patient is here for diabetes follow-up. She states that she has been taking her medications religiously but has not been monitoring her CBGs.She states that she feels well and has had no unfortunate side effects.She has recently changed some of her diet in attempts to eat more healthy. She reports exercising infrequently. She denies any issues at this time. She denies any symptoms of hypoglycemia.  Patient has self discontinued her Crestor medication due to some leg cramping that she was experiencing. She has no desire to restart this medication but is asking if there is anything all she can take.  Patient denies any recent headache, lightheadedness, nausea, vertigo, shortness of breath, fever, chills, chest pain, abdominal pain, diarrhea, dysuria, polyuria, numbness, weakness, paresthesias.  CC, SH/smoking status, and VS noted  Objective: BP 150/68 mmHg  Pulse 75  Temp(Src) 98.4 F (36.9 C) (Oral)  Wt 266 lb (120.657 kg) Gen: NAD, alert, cooperative, and pleasant. CV: RRR  Resp: CTAB, no wheezes, non-labored Abd: SNTND, BS present, no guarding or organomegaly Ext: No edema, warm Neuro: Alert and oriented, Speech clear, No gross deficits  Assessment and plan:  Diabetes mellitus, type II Stable: Patient's A1c today was 9.6. This is slightly elevated from the previous value of 9.1. Patient endorses good compliance with her medications however she has not been monitoring her CBGs.Today her weight is down 6 pounds from when she has previously seen before the new year. She does report Being less physically active than she had been before. She does have plans to increase her physical activity. We discussed at length possible options to increase her physical activity. - I'm increasing her metformin dose from 500 mg twice a day to 1000 mg once in AM and 500 mg once in PM. - Follow-up in 3 months.  HYPERTENSION, BENIGN  SYSTEMIC Stable: Patient is currently being treated for hypertension. Today in the office her blood pressure was 150/68. She states that she recently had a cup of coffee as well as a Coke prior to that. She states that she has not had  Blood pressures this high when she checks it at home but does endorse some new stress at home through her work. - I have asked patient to record 3 consecutive days of random blood pressures throughout the week next week. She has been to contact our office with those values. If those values are elevated then I will opt to increase the dosing of some of her medications. - will monitor.  HYPERCHOLESTEROLEMIA Stable, but untreated: Patient has stopped taking the prescribed Crestor due to adverse side effects of cramping in her legs.Patient would like to be placed on a different medication at this time. - I have placed her on 20 mg pravastatin daily.  - I have informed her that if she experiences similar symptoms to the ones that she experienced on Crestor and atorvastatin that we will discontinue this and place all statins as a common allergy.    Orders Placed This Encounter  Procedures  . POCT A1C    Meds ordered this encounter  Medications  . omeprazole (PRILOSEC) 20 MG capsule    Sig: Take 1 capsule (20 mg total) by mouth daily.    Dispense:  30 capsule    Refill:  11  . chlorthalidone (HYGROTON) 50 MG tablet    Sig: Take 1 tablet (50 mg total) by mouth daily.    Dispense:  30 tablet    Refill:  11  .  HYDROcodone-acetaminophen (NORCO/VICODIN) 5-325 MG tablet    Sig: Take 1-2 tablets by mouth every 6 (six) hours as needed for moderate pain or severe pain.    Dispense:  20 tablet    Refill:  0  . meloxicam (MOBIC) 7.5 MG tablet    Sig: Take 1 tablet (7.5 mg total) by mouth 2 (two) times daily.    Dispense:  60 tablet    Refill:  2  . metFORMIN (GLUCOPHAGE) 500 MG tablet    Sig: Take 500 mg (1 tablet) every morning. Take 1000 mg (2 tablets) every  afternoon.    Dispense:  90 tablet    Refill:  11  . metoprolol (LOPRESSOR) 50 MG tablet    Sig: Take 1 tablet (50 mg total) by mouth 2 (two) times daily.    Dispense:  60 tablet    Refill:  11  . traZODone (DESYREL) 50 MG tablet    Sig: Take 1 tablet (50 mg total) by mouth at bedtime as needed for sleep. 1-2 tabs by mouth at bedtime as needed for sleep 1-2 tabs by mouth at bedtime as needed for sleep    Dispense:  30 tablet    Refill:  2  . pravastatin (PRAVACHOL) 20 MG tablet    Sig: Take 2 tablets (40 mg total) by mouth daily.    Dispense:  30 tablet    Refill:  11     Elberta Leatherwood, MD,MS,  PGY2 01/30/2016 5:43 PM

## 2016-01-30 NOTE — Assessment & Plan Note (Signed)
Stable, but untreated: Patient has stopped taking the prescribed Crestor due to adverse side effects of cramping in her legs.Patient would like to be placed on a different medication at this time. - I have placed her on 20 mg pravastatin daily.  - I have informed her that if she experiences similar symptoms to the ones that she experienced on Crestor and atorvastatin that we will discontinue this and place all statins as a common allergy.

## 2016-01-30 NOTE — Assessment & Plan Note (Signed)
Stable: Patient's A1c today was 9.6. This is slightly elevated from the previous value of 9.1. Patient endorses good compliance with her medications however she has not been monitoring her CBGs.Today her weight is down 6 pounds from when she has previously seen before the new year. She does report Being less physically active than she had been before. She does have plans to increase her physical activity. We discussed at length possible options to increase her physical activity. - I'm increasing her metformin dose from 500 mg twice a day to 1000 mg once in AM and 500 mg once in PM. - Follow-up in 3 months.

## 2016-01-30 NOTE — Assessment & Plan Note (Signed)
Stable: Patient is currently being treated for hypertension. Today in the office her blood pressure was 150/68. She states that she recently had a cup of coffee as well as a Coke prior to that. She states that she has not had  Blood pressures this high when she checks it at home but does endorse some new stress at home through her work. - I have asked patient to record 3 consecutive days of random blood pressures throughout the week next week. She has been to contact our office with those values. If those values are elevated then I will opt to increase the dosing of some of her medications. - will monitor.

## 2016-01-30 NOTE — Patient Instructions (Signed)
It was a pleasure seeing you today in our clinic. Today we discussed your diabetes and medications. Here is the treatment plan we have discussed and agreed upon together:   - I would like to increase the dose of your metformin by 1 tablet every day.to do this take 2 tablets either in the morning or at night, and 1 tablet during the other part of the day. - Your blood pressure is slightly elevated today.  I would like for you to keep track of this to make sure that it is not this high on a regular basis. If it is, I would like to increase one of your medications. Please take your blood pressure 3 consecutive days sometime next week at your local pharmacy, then call our office to inform me of what your blood pressure was.

## 2016-05-16 IMAGING — US US PELVIS COMPLETE
1 series · 13 of 25 positions shown · non-contrast
Comparison: None

CLINICAL DATA: Postmenopausal bleeding intermittently over the last
year. Previous BTL. Patient has diabetes, hypertension. Gravida 3
para 2.

EXAM:
TRANSABDOMINAL AND TRANSVAGINAL ULTRASOUND OF PELVIS
TECHNIQUE: Both transabdominal and transvaginal ultrasound examinations of the
pelvis were performed. Transabdominal technique was performed for
global imaging of the pelvis including uterus, ovaries, adnexal
regions, and pelvic cul-de-sac. It was necessary to proceed with
endovaginal exam following the transabdominal exam to visualize the
endometrium and ovaries.

[Series 1: us non-ob tv/pel · 39 acquisitions, 13 frames shown]
[im 1/39]
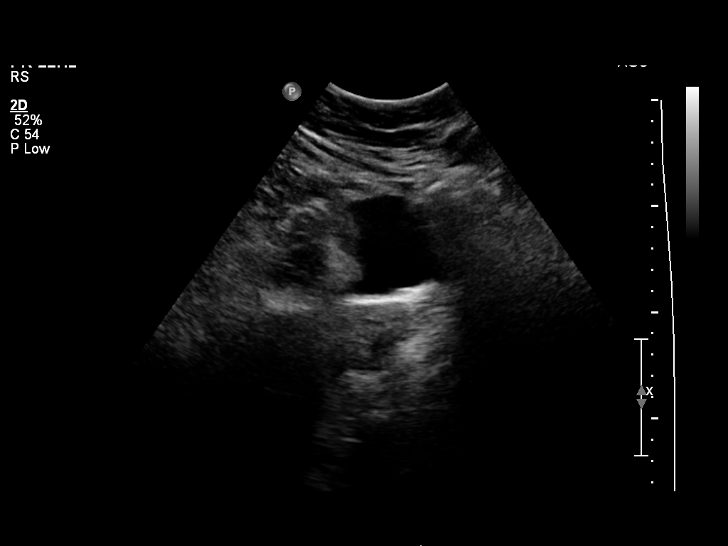
[im 4/39]
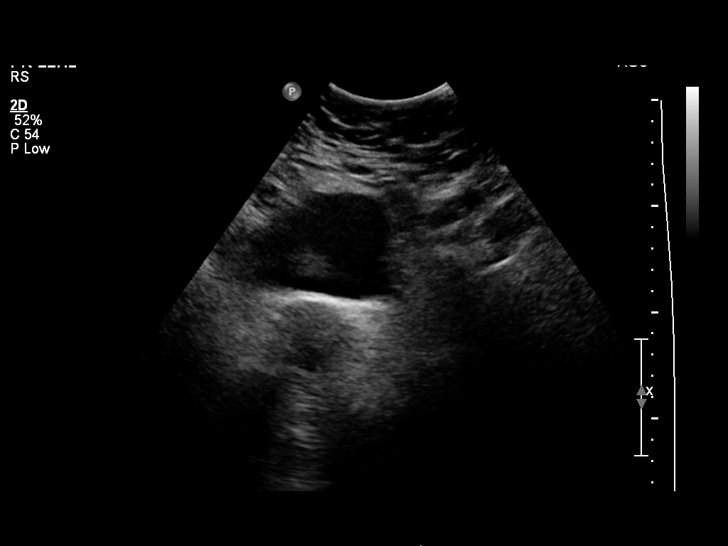
[im 7/39]
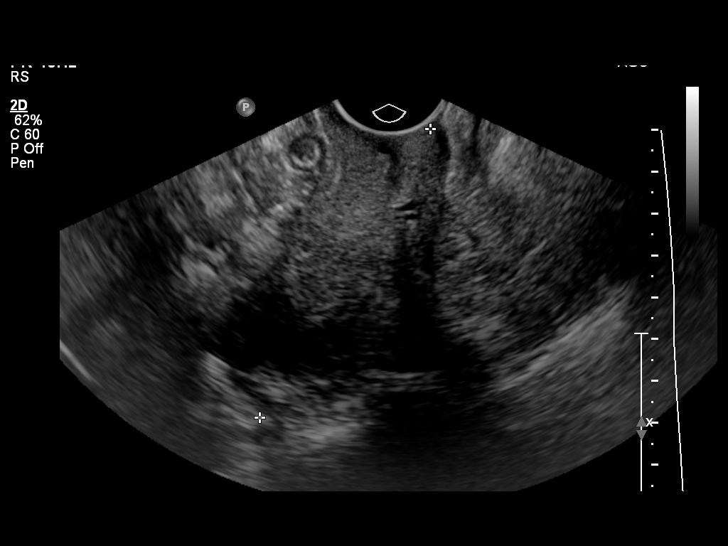
[im 10/39]
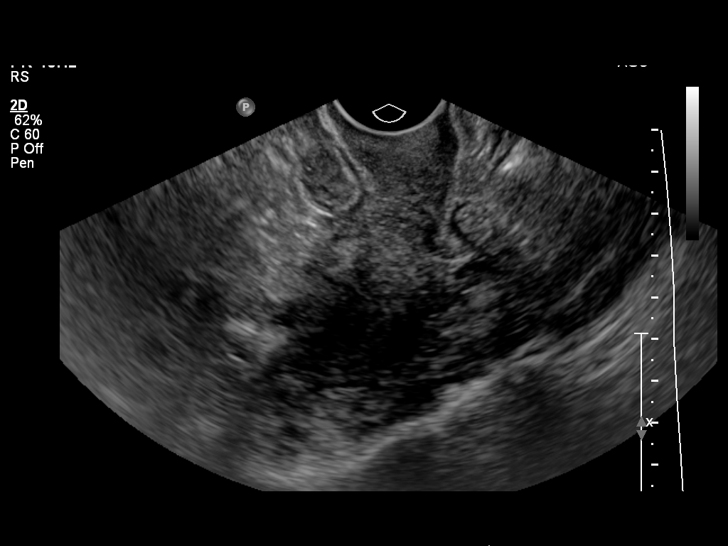
[im 13/39]
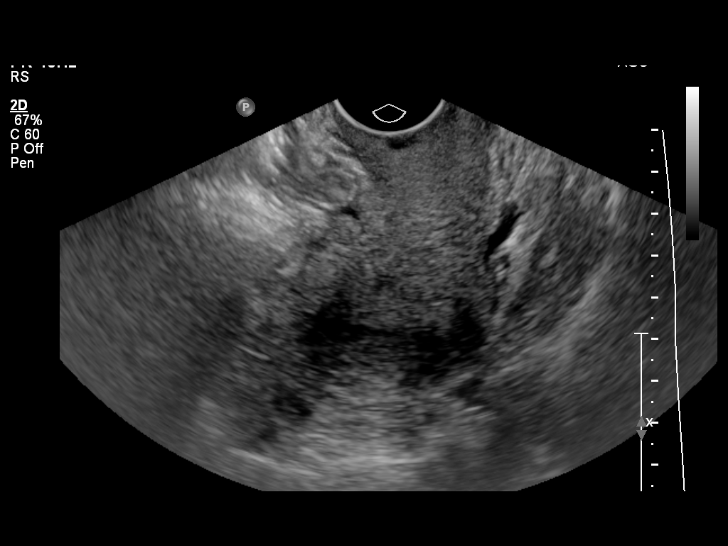
[im 16/39]
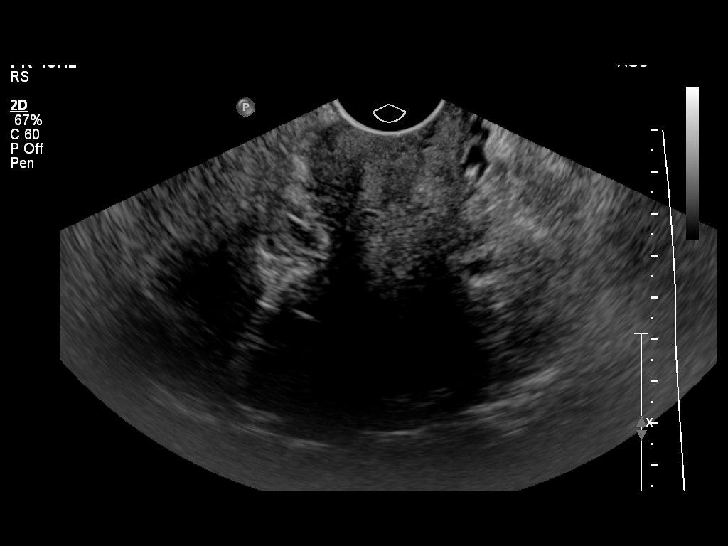
[im 20/39]
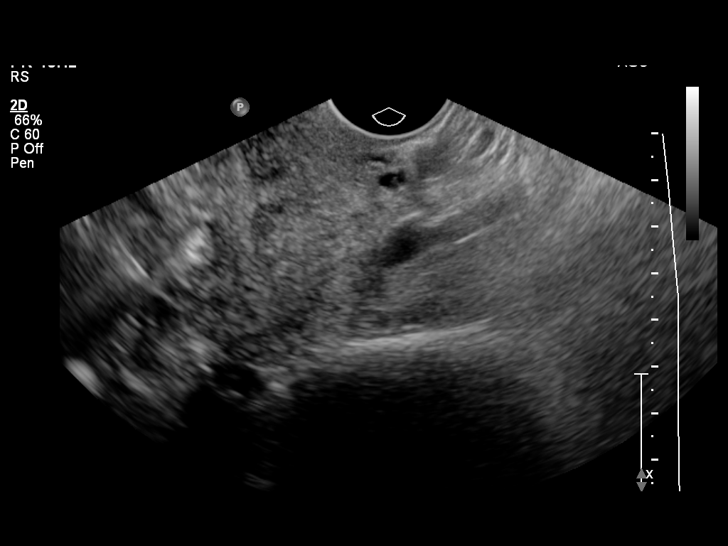
[im 23/39]
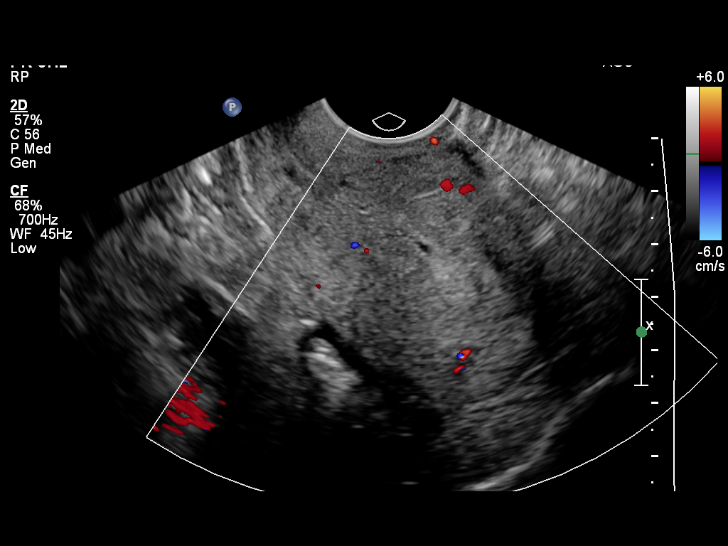
[im 26/39]
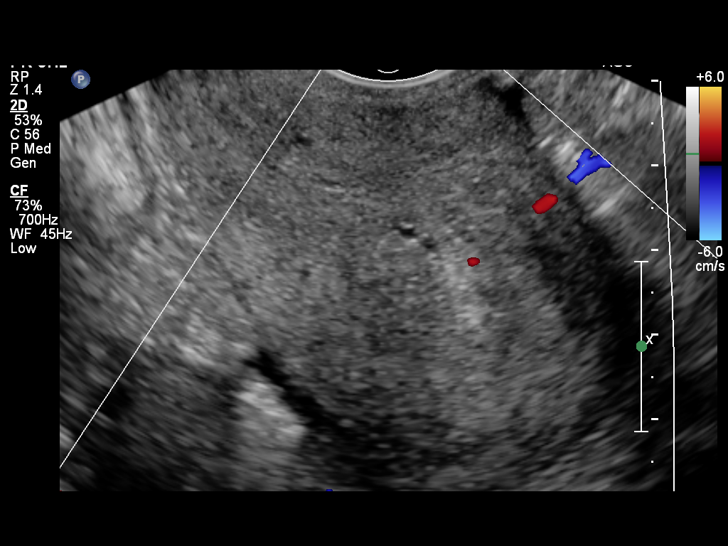
[im 29/39]
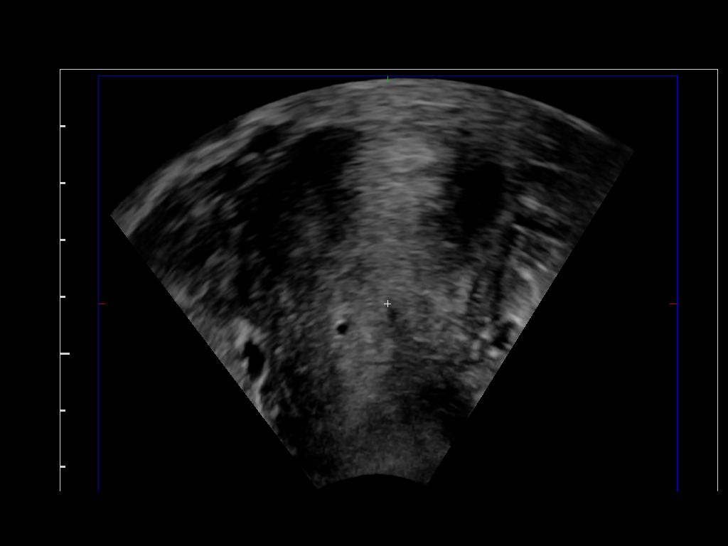
[im 32/39]
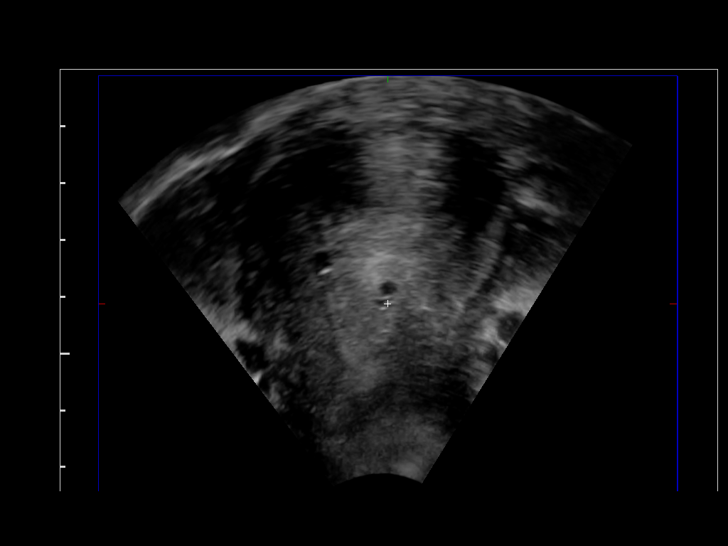
[im 35/39]
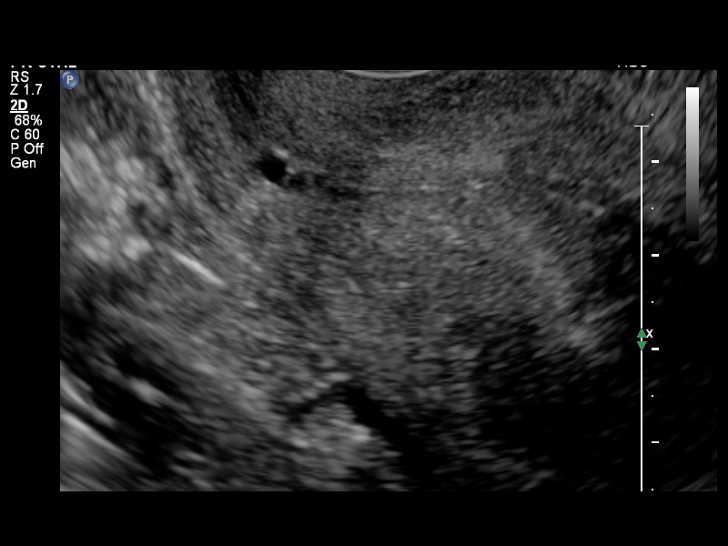
[im 39/39]
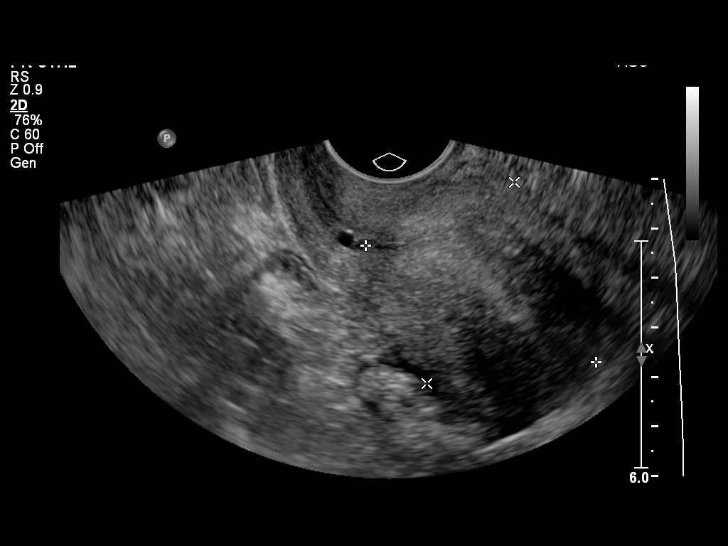

[13 of 25 positions shown; findings below may reference images not displayed]

FINDINGS: Uterus

Measurements: 8.0 x 4.4 x 5.1 cm. Retroverted without focal mass.

Endometrium

Thickness: 7.6 mm. Heterogeneous with mobile internal echoes. There
is increased vascularity within the endometrium. No focal mass
identified.

Right ovary

Measurements: The ovary is not visualized, and may be absent or
obscured. No adnexal mass identified.

Left ovary

Measurements: The ovary is not visualized, either absent or obscured
. No adnexal mass identified.

Other findings

No free fluid.
IMPRESSION: 1. Retroverted uterus.
2. Thickened endometrial stripe. In the setting of post-menopausal
bleeding, endometrial sampling is indicated to exclude carcinoma. If
results are benign, sonohysterogram should be considered for focal
lesion work-up. (Ref: Radiological Reasoning: Algorithmic Workup of
Abnormal Vaginal Bleeding with Endovaginal Sonography and
Sonohysterography. AJR [id number]; 191:S68-73)
3. Nonvisualized ovaries.

## 2016-06-24 ENCOUNTER — Other Ambulatory Visit: Payer: Self-pay | Admitting: Family Medicine

## 2016-08-11 ENCOUNTER — Ambulatory Visit (INDEPENDENT_AMBULATORY_CARE_PROVIDER_SITE_OTHER): Payer: 59 | Admitting: Family Medicine

## 2016-08-11 VITALS — BP 148/86 | HR 79 | Temp 98.1°F | Ht 63.0 in | Wt 265.2 lb

## 2016-08-11 DIAGNOSIS — G8929 Other chronic pain: Secondary | ICD-10-CM | POA: Diagnosis not present

## 2016-08-11 DIAGNOSIS — I10 Essential (primary) hypertension: Secondary | ICD-10-CM | POA: Diagnosis not present

## 2016-08-11 DIAGNOSIS — F32A Depression, unspecified: Secondary | ICD-10-CM

## 2016-08-11 DIAGNOSIS — E118 Type 2 diabetes mellitus with unspecified complications: Secondary | ICD-10-CM | POA: Diagnosis not present

## 2016-08-11 DIAGNOSIS — Z20828 Contact with and (suspected) exposure to other viral communicable diseases: Secondary | ICD-10-CM

## 2016-08-11 DIAGNOSIS — M25561 Pain in right knee: Secondary | ICD-10-CM | POA: Diagnosis not present

## 2016-08-11 DIAGNOSIS — M25562 Pain in left knee: Secondary | ICD-10-CM | POA: Diagnosis not present

## 2016-08-11 DIAGNOSIS — Z23 Encounter for immunization: Secondary | ICD-10-CM

## 2016-08-11 DIAGNOSIS — Z79899 Other long term (current) drug therapy: Secondary | ICD-10-CM | POA: Diagnosis not present

## 2016-08-11 DIAGNOSIS — F329 Major depressive disorder, single episode, unspecified: Secondary | ICD-10-CM | POA: Diagnosis not present

## 2016-08-11 LAB — TSH: TSH: 2.59 mIU/L

## 2016-08-11 LAB — BASIC METABOLIC PANEL WITH GFR
BUN: 20 mg/dL (ref 7–25)
CALCIUM: 9.3 mg/dL (ref 8.6–10.4)
CO2: 26 mmol/L (ref 20–31)
CREATININE: 0.92 mg/dL (ref 0.50–0.99)
Chloride: 102 mmol/L (ref 98–110)
GFR, Est African American: 78 mL/min (ref >=60)
GFR, Est Non African American: 67 mL/min (ref >=60)
GLUCOSE: 206 mg/dL — AB (ref 65–99)
Potassium: 4 mmol/L (ref 3.5–5.3)
Sodium: 141 mmol/L (ref 135–146)

## 2016-08-11 LAB — LIPID PANEL
Cholesterol: 311 mg/dL — ABNORMAL HIGH (ref <200)
HDL: 83 mg/dL (ref >50)
LDL CALC: 188 mg/dL — AB
Total CHOL/HDL Ratio: 3.7 Ratio (ref <5.0)
Triglycerides: 201 mg/dL — ABNORMAL HIGH (ref <150)
VLDL: 40 mg/dL — AB (ref <30)

## 2016-08-11 LAB — POCT GLYCOSYLATED HEMOGLOBIN (HGB A1C): Hemoglobin A1C: 8.1

## 2016-08-11 MED ORDER — METHYLPREDNISOLONE ACETATE 80 MG/ML IJ SUSP
80.0000 mg | Freq: Once | INTRAMUSCULAR | Status: AC
  Administered 2016-08-11: 80 mg via INTRA_ARTICULAR

## 2016-08-11 NOTE — Assessment & Plan Note (Signed)
Stable: Patient's blood pressure is mildly elevated at this time. Patient states she has not taken her medications yet today. - No change in medication regimen at this time.

## 2016-08-11 NOTE — Assessment & Plan Note (Signed)
Patient is complaining of feelings of depression today. She states that today is "just a bad day". He denies any symptoms lasting greater than 2 weeks. PHQ9 score of 2 today. - Discussed some of her depressed feelings today. - Informed patient that if she felt it necessary to schedule an appointment "just to talk" that I would be willing to do this. Patient was very thankful for this.

## 2016-08-11 NOTE — Progress Notes (Signed)
HPI  CC: Diabetes and bilateral knee pain Patient is here for follow-up on her diabetes. She endorses good compliance with her medications at this time. She denies any side effects or symptoms consistent with hypoglycemic events. She states that she has been trying to lose weight but has had a hard time keeping her motivation elevated. She has the understanding that she has gained weight since her last visit. She denies any fever, chills, polydipsia, polyuria, headaches, nausea, vomiting, diarrhea, dysuria, weakness, numbness, or paresthesias.  Bilateral knee pain: Patient is also complaining of worsening bilateral knee pain. She describes the pain as "all over". Pain is worse with prolonged ambulation. Her knees feel stiff first thing in the morning and seemed to "loosen up" as the morning progresses. Unfortunately as her day progresses her achy pain gets worse. She states that stairs are her "least favorite". She denies any swelling or effusions. No new injuries. No popping, locking, or catching.  Miscellaneous: Patient has slightly elevated blood pressure. Patient states that she has not taken her medications at this morning and she did not know if she could take them if she plan on having labs performed. Patient endorsed some depressed mood today. She states that she is just having a bad day. PHQ9 was provided.  Review of Systems    See HPI for ROS. All other systems reviewed and are negative.  CC, SH/smoking status, and VS noted  Objective: BP (!) 148/86   Pulse 79   Temp 98.1 F (36.7 C) (Oral)   Ht 5\' 3"  (1.6 m)   Wt 265 lb 3.2 oz (120.3 kg)   SpO2 98%   BMI 46.98 kg/m  Gen: NAD, alert, cooperative, and pleasant. CV: RRR, no murmur Resp: CTAB, no wheezes, non-labored Abd: SNTND, obese, BS present, no guarding or organomegaly Ext: No edema, warm Knees, bilateral: No evidence of effusion, no obvious bony abnormalities, range of motion intact but in slight limitation with full  extension and full flexion. Strength 5/5 bilaterally, patellar crepitus +2 bilaterally, no patellar tenderness, bilateral medial joint line tenderness, all 4 ligaments are intact with solid end points, some additional laxity of MCL bilaterally, McMurray's positive for click but negative for severe pain. Neuro: Alert and oriented, Speech clear, No gross deficits  INJECTION: Knees bilaterally Patient was given informed consent, signed copy in the chart. Appropriate time out was taken. Area prepped and draped in usual sterile fashion. 1 cc of methylprednisolone 40 mg/ml plus  4 cc of 1% lidocaine without epinephrine was injected into the bilateral knee using a(n) inferolateral approach. The patient tolerated the procedure well. There were no complications. Post procedure instructions were given.   Assessment and plan:  Diabetes mellitus, type II Improved: Hemoglobin A1c 8.1 today. This is improved from April in which it was 9.6. She has no complaints of side effects this time. No reports of hypoglycemic events. She is trying to lose weight but understands that she has to stay motivated for this. We long discussion about appropriate weight loss activity she can do with her bilateral knee arthritis. I recommended swimming. If swimming is not available I recommended a stationary bike and seeking help if she needed advice on proper seat placement. - Continue current medication regimen. - Weight loss counseling provided. - Diet modification counseling provided. - Information on the Mediterranean diet provided. - Follow-up in 3 months.  Bilateral knee pain Worsening: Patient is experiencing worsening bilateral knee pain. She states that she has had injections in the past but  it has been multiple years. She states that pain is constant and does limit her ability to function regularly. Patient is taking meloxicam for this. 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 - Knee radiographs deemed unnecessary at this time. - Bilateral knee injections provided today. - Advised reduction of meloxicam use and substitute with Tylenol. - Advised on regular icing after long days on her feet or exercise. - I would be willing to repeat injections every 3 months or greater as needed.  Next: Due to her previous x-rays it is likely the patient will need knee arthroplasty in the future. Until that time I will try to prolong this necessity as long as safely possible, or until patient is interested in surgical intervention.  HYPERTENSION, BENIGN SYSTEMIC Stable: Patient's blood pressure is mildly elevated at this time. Patient states she has not taken her medications yet today. - No change in medication regimen at this time.  Depression Patient is complaining of feelings of depression today. She states that today is "just a bad day". He denies any symptoms lasting greater than 2 weeks. PHQ9 score of 2 today. - Discussed some of her depressed feelings today. - Informed patient that if she felt it necessary to schedule an appointment "just to talk" that I would be willing to do this. Patient was very thankful for this.   Orders Placed This Encounter  Procedures  . Flu Vaccine QUAD 36+ mos IM  . Pneumococcal polysaccharide vaccine 23-valent greater than or equal to 2yo subcutaneous/IM  . BASIC METABOLIC PANEL WITH GFR  . TSH  . Lipid panel  . Hepatitis C antibody  . HIV antibody  . POCT glycosylated hemoglobin (Hb A1C)    Meds ordered this encounter  Medications  . methylPREDNISolone acetate (DEPO-MEDROL) injection 80 mg     Elberta Leatherwood, MD,MS,  PGY3 08/11/2016 3:05 PM

## 2016-08-11 NOTE — Assessment & Plan Note (Signed)
Worsening: Patient is experiencing worsening bilateral knee pain. She states that she has had injections in the past but it has been multiple years. She states that pain is constant and does limit her ability to function regularly. Patient is taking meloxicam for this. Patient has left knee radiographs from 5 years ago which showed tricompartmental OA, I can only imagine the patient's arthritis has progressed since that time. - Knee radiographs deemed unnecessary at this time. - Bilateral knee injections provided today. - Advised reduction of meloxicam use and substitute with Tylenol. - Advised on regular icing after long days on her feet or exercise. - I would be willing to repeat injections every 3 months or greater as needed.  Next: Due to her previous x-rays it is likely the patient will need knee arthroplasty in the future. Until that time I will try to prolong this necessity as long as safely possible, or until patient is interested in surgical intervention.

## 2016-08-11 NOTE — Assessment & Plan Note (Signed)
Improved: Hemoglobin A1c 8.1 today. This is improved from April in which it was 9.6. She has no complaints of side effects this time. No reports of hypoglycemic events. She is trying to lose weight but understands that she has to stay motivated for this. We long discussion about appropriate weight loss activity she can do with her bilateral knee arthritis. I recommended swimming. If swimming is not available I recommended a stationary bike and seeking help if she needed advice on proper seat placement. - Continue current medication regimen. - Weight loss counseling provided. - Diet modification counseling provided. - Information on the Mediterranean diet provided. - Follow-up in 3 months.

## 2016-08-11 NOTE — Patient Instructions (Signed)
It was a pleasure seeing you today in our clinic. Today we discussed Your knee pain and diabetes. Here is the treatment plan we have discussed and agreed upon together:   - Continue taking your medications as prescribed. - Avoid submerging your knees under water for the next 24 hours. - Low impact exercise will likely be the most beneficial for you as it is less likely to aggravate your knee arthritis. - Weight loss is going to be key for long-term improvement of your knee pain and overall health. - Listed below is some recommendations for a measuring in diet. I would not recommend complete conversion to this diet over a short period of time. Instead, take a few ideas from the listed information and do your best to incorporate these practices into your diet slowly over time.      Why follow it? Research shows. . Those who follow the Mediterranean diet have a reduced risk of heart disease  . The diet is associated with a reduced incidence of Parkinson's and Alzheimer's diseases . People following the diet may have longer life expectancies and lower rates of chronic diseases  . The Dietary Guidelines for Americans recommends the Mediterranean diet as an eating plan to promote health and prevent disease  What Is the Mediterranean Diet?  . Healthy eating plan based on typical foods and recipes of Mediterranean-style cooking . The diet is primarily a plant based diet; these foods should make up a majority of meals   Starches - Plant based foods should make up a majority of meals - They are an important sources of vitamins, minerals, energy, antioxidants, and fiber - Choose whole grains, foods high in fiber and minimally processed items  - Typical grain sources include wheat, oats, barley, corn, brown rice, bulgar, farro, millet, polenta, couscous  - Various types of beans include chickpeas, lentils, fava beans, black beans, white beans   Fruits  Veggies - Large quantities of antioxidant rich  fruits & veggies; 6 or more servings  - Vegetables can be eaten raw or lightly drizzled with oil and cooked  - Vegetables common to the traditional Mediterranean Diet include: artichokes, arugula, beets, broccoli, brussel sprouts, cabbage, carrots, celery, collard greens, cucumbers, eggplant, kale, leeks, lemons, lettuce, mushrooms, okra, onions, peas, peppers, potatoes, pumpkin, radishes, rutabaga, shallots, spinach, sweet potatoes, turnips, zucchini - Fruits common to the Mediterranean Diet include: apples, apricots, avocados, cherries, clementines, dates, figs, grapefruits, grapes, melons, nectarines, oranges, peaches, pears, pomegranates, strawberries, tangerines  Fats - Replace butter and margarine with healthy oils, such as olive oil, canola oil, and tahini  - Limit nuts to no more than a handful a day  - Nuts include walnuts, almonds, pecans, pistachios, pine nuts  - Limit or avoid candied, honey roasted or heavily salted nuts - Olives are central to the [employer] - can be eaten whole or used in a variety of dishes   Meats Protein - Limiting red meat: no more than a few times a month - When eating red meat: choose lean cuts and keep the portion to the size of deck of cards - Eggs: approx. 0 to 4 times a week  - Fish and lean poultry: at least 2 a week  - Healthy protein sources include, chicken, [place], lean beef, lamb - Increase intake of seafood such as tuna, salmon, trout, mackerel, shrimp, scallops - Avoid or limit high fat processed meats such as sausage and bacon  Dairy - Include moderate amounts of low fat dairy products  -  Focus on healthy dairy such as fat free yogurt, skim milk, low or reduced fat cheese - Limit dairy products higher in fat such as whole or 2% milk, cheese, ice cream  Alcohol - Moderate amounts of red wine is ok  - No more than 5 oz daily for women (all ages) and men older than age 41  - No more than 10 oz of wine daily for men younger than 44  Other -  Limit sweets and other desserts  - Use herbs and spices instead of salt to flavor foods  - Herbs and spices common to the traditional Mediterranean Diet include: basil, bay leaves, chives, cloves, cumin, fennel, garlic, lavender, marjoram, mint, oregano, parsley, pepper, rosemary, sage, savory, sumac, tarragon, thyme   It's not just a diet, it's a lifestyle:  . The Mediterranean diet includes lifestyle factors typical of those in the region  . Foods, drinks and meals are best eaten with others and savored . Daily physical activity is important for overall good health . This could be strenuous exercise like running and aerobics . This could also be more leisurely activities such as walking, housework, yard-work, or taking the stairs . Moderation is the key; a balanced and healthy diet accommodates most foods and drinks . Consider portion sizes and frequency of consumption of certain foods   Meal Ideas & Options:  . Breakfast:  o Whole wheat toast or whole wheat English muffins with peanut butter & hard boiled egg o Steel cut oats topped with apples & cinnamon and skim milk  o Fresh fruit: banana, strawberries, melon, berries, peaches  o Smoothies: strawberries, bananas, greek yogurt, peanut butter o Low fat greek yogurt with blueberries and granola  o Egg white omelet with spinach and mushrooms o Breakfast couscous: whole wheat couscous, apricots, skim milk, cranberries  . Sandwiches:  o Hummus and grilled vegetables (peppers, zucchini, squash) on whole wheat bread   o Grilled chicken on whole wheat pita with lettuce, tomatoes, cucumbers or tzatziki  o Tuna salad on whole wheat bread: tuna salad made with greek yogurt, olives, red peppers, capers, green onions o Garlic rosemary lamb pita: lamb sauted with garlic, rosemary, salt & pepper; add lettuce, cucumber, greek yogurt to pita - flavor with lemon juice and black pepper  . Seafood:  o Mediterranean grilled salmon, seasoned with garlic,  basil, parsley, lemon juice and black pepper o Shrimp, lemon, and spinach whole-grain pasta salad made with low fat greek yogurt  o Seared scallops with lemon orzo  o Seared tuna steaks seasoned salt, pepper, coriander topped with tomato mixture of olives, tomatoes, olive oil, minced garlic, parsley, green onions and cappers  . Meats:  o Herbed greek chicken salad with kalamata olives, cucumber, feta  o Red bell peppers stuffed with spinach, bulgur, lean ground beef (or lentils) & topped with feta   o Kebabs: skewers of chicken, tomatoes, onions, zucchini, squash  o [place] burgers: made with red onions, mint, dill, lemon juice, feta cheese topped with roasted red peppers . Vegetarian o Cucumber salad: cucumbers, artichoke hearts, celery, red onion, feta cheese, tossed in olive oil & lemon juice  o Hummus and whole grain pita points with a greek salad (lettuce, tomato, feta, olives, cucumbers, red onion) o Lentil soup with celery, carrots made with vegetable broth, garlic, salt and pepper  o Tabouli salad: parsley, bulgur, mint, scallions, cucumbers, tomato, radishes, lemon juice, olive oil, salt and pepper.

## 2016-08-12 LAB — HEPATITIS C ANTIBODY: HCV AB: NEGATIVE

## 2016-08-12 LAB — HIV ANTIBODY (ROUTINE TESTING W REFLEX): HIV: NONREACTIVE

## 2016-08-13 ENCOUNTER — Encounter: Payer: Self-pay | Admitting: Family Medicine

## 2016-08-28 ENCOUNTER — Other Ambulatory Visit: Payer: Self-pay | Admitting: Family Medicine

## 2016-10-28 ENCOUNTER — Telehealth: Payer: Self-pay | Admitting: Family Medicine

## 2016-10-28 NOTE — Telephone Encounter (Signed)
Her office has moved and she has been assigned a handicap parking space.  Would dr Alease Frame sign the forms for the handicap placard? Please advise

## 2016-10-29 NOTE — Telephone Encounter (Signed)
I think patient and I should discuss this in person. Sorry about the inconvenience.

## 2016-11-01 NOTE — Telephone Encounter (Signed)
LMOVM for pt to call us back. Deseree Blount, CMA  

## 2016-11-04 ENCOUNTER — Encounter: Payer: Self-pay | Admitting: Family Medicine

## 2016-11-04 ENCOUNTER — Ambulatory Visit (INDEPENDENT_AMBULATORY_CARE_PROVIDER_SITE_OTHER): Payer: 59 | Admitting: Family Medicine

## 2016-11-04 VITALS — BP 158/90 | HR 87 | Temp 98.2°F | Wt 260.0 lb

## 2016-11-04 DIAGNOSIS — M25561 Pain in right knee: Secondary | ICD-10-CM | POA: Diagnosis not present

## 2016-11-04 DIAGNOSIS — G8929 Other chronic pain: Secondary | ICD-10-CM

## 2016-11-04 DIAGNOSIS — M25562 Pain in left knee: Secondary | ICD-10-CM | POA: Diagnosis not present

## 2016-11-04 MED ORDER — DICLOFENAC SODIUM 1 % TD GEL
2.0000 g | Freq: Four times a day (QID) | TRANSDERMAL | 1 refills | Status: DC
Start: 2016-11-04 — End: 2018-02-09

## 2016-11-04 NOTE — Patient Instructions (Signed)
It was a pleasure seeing you today in our clinic. Today we discussed your knee pain. Here is the treatment plan we have discussed and agreed upon together:   - I prescribed you a gel to apply to her knee. You can apply this 4 times a day. - I have placed a referral to orthopedic surgery. They will contact you set up an appointment.

## 2016-11-04 NOTE — Assessment & Plan Note (Signed)
Patient is here for worsening knee pain R>L. we've attempted multiple joint injections in the past. Patient states that she some relief with these but they do wear off relatively quickly. Patient is not interested in surgical intervention but understands that this is likely in her future. - Referral to orthopedic surgery >> my hope is that patient may be a candidate for Synvisc injections. - Voltaren gel - Follow-up when necessary

## 2016-11-04 NOTE — Progress Notes (Signed)
   HPI  CC: Right-sided knee pain Patient is here with complaints of right-sided knee pain. She states that she is having a significant issue going up and down stairs. Pain is worse than at our last visit. The cortisone injections we provided at the last visit helped for a period of time but has since worn off. She is not interested in any surgical intervention at this time but would like to look into other options outside of these steroid injections.  She denies any new trauma or injury. No fevers, chills, nausea, vomiting, diarrhea, weakness, numbness, paresthesias.  Review of Systems    See HPI for ROS. All other systems reviewed and are negative.  CC, SH/smoking status, and VS noted  Objective: BP (!) 158/90   Pulse 87   Temp 98.2 F (36.8 C) (Oral)   Wt 260 lb (117.9 kg)   SpO2 98%   BMI 46.06 kg/m  Gen: NAD, alert, cooperative, and pleasant. CV: RRR, no murmur Resp: CTAB, no wheezes, non-labored Knees, bilateral: No evidence of effusion, no obvious bony abnormalities, range of motion intact but in slight limitation with full extension and full flexion. Strength 5/5 bilaterally, patellar crepitus +2 bilaterally, no patellar tenderness, bilateral medial joint line tenderness, all 4 ligaments are intact with solid end points, some additional laxity of MCL bilaterally, McMurray's positive for click but negative for severe pain.   Assessment and plan:  Bilateral knee pain Patient is here for worsening knee pain R>L. we've attempted multiple joint injections in the past. Patient states that she some relief with these but they do wear off relatively quickly. Patient is not interested in surgical intervention but understands that this is likely in her future. - Referral to orthopedic surgery >> my hope is that patient may be a candidate for Synvisc injections. - Voltaren gel - Follow-up when necessary   Orders Placed This Encounter  Procedures  . Ambulatory referral to Orthopedic  Surgery    Referral Priority:   Routine    Referral Type:   Surgical    Referral Reason:   Specialty Services Required    Requested Specialty:   Orthopedic Surgery    Number of Visits Requested:   1    Meds ordered this encounter  Medications  . diclofenac sodium (VOLTAREN) 1 % GEL    Sig: Apply 2 g topically 4 (four) times daily.    Dispense:  100 g    Refill:  1     Elberta Leatherwood, MD,MS,  PGY3 11/04/2016 2:12 PM

## 2016-11-10 NOTE — Telephone Encounter (Signed)
Please call patient to inform her that paperwork is ready for pick up  Elberta Leatherwood, MD,MS,  PGY3 11/10/2016 6:51 PM

## 2016-11-11 NOTE — Telephone Encounter (Signed)
Left message on voicemail informing that form was ready to be picked up. 

## 2016-11-16 ENCOUNTER — Telehealth: Payer: Self-pay | Admitting: *Deleted

## 2016-11-16 NOTE — Telephone Encounter (Signed)
Prior Authorization received from [facility] for Omega-3-acid Ethyl Esters.  PA form placed in provider box for completion. Derl Barrow, RN

## 2016-11-16 NOTE — Telephone Encounter (Signed)
Called patient to ask for clarification on this medication

## 2016-11-16 NOTE — Telephone Encounter (Signed)
Patient is available by phone the rest of today. Tomorrow, she is available anytime before 10:00 AM and then between 1:00PM and 3:30 PM.

## 2016-11-17 ENCOUNTER — Telehealth: Payer: Self-pay

## 2016-11-17 NOTE — Telephone Encounter (Signed)
Called patient. Ethyl Esters recommended by her pharmacist. Likely due to elevated triglycerides. Unknown to pharmacist, lipid panel was NOT fasting, so I anticipated this to be elevated.   Will NOT sign for Ethyl Esters at this time. Patient in agreement.

## 2016-11-17 NOTE — Telephone Encounter (Signed)
Called again, got VM. Asked that she call back, also, I asked that she let the staff know if I can leave a detailed message next time if I get her VM in the future.  Please document if she approves leaving details.

## 2016-11-17 NOTE — Telephone Encounter (Signed)
Try to call again. If you get voice mail please leave a detailed message. [phone]. Ottis Stain, CMA

## 2016-11-18 ENCOUNTER — Ambulatory Visit (INDEPENDENT_AMBULATORY_CARE_PROVIDER_SITE_OTHER): Payer: 59 | Admitting: Orthopaedic Surgery

## 2016-11-18 ENCOUNTER — Ambulatory Visit (INDEPENDENT_AMBULATORY_CARE_PROVIDER_SITE_OTHER): Payer: 59

## 2016-11-18 ENCOUNTER — Encounter (INDEPENDENT_AMBULATORY_CARE_PROVIDER_SITE_OTHER): Payer: Self-pay | Admitting: Orthopaedic Surgery

## 2016-11-18 DIAGNOSIS — G8929 Other chronic pain: Secondary | ICD-10-CM | POA: Diagnosis not present

## 2016-11-18 DIAGNOSIS — M25562 Pain in left knee: Secondary | ICD-10-CM

## 2016-11-18 DIAGNOSIS — M1711 Unilateral primary osteoarthritis, right knee: Secondary | ICD-10-CM

## 2016-11-18 DIAGNOSIS — M25561 Pain in right knee: Secondary | ICD-10-CM

## 2016-11-18 DIAGNOSIS — M1712 Unilateral primary osteoarthritis, left knee: Secondary | ICD-10-CM | POA: Diagnosis not present

## 2016-11-18 NOTE — Progress Notes (Signed)
Office Visit Note   Patient: [name]           Date of Birth: November 01, 1955           MRN: [id number] Visit Date: 11/18/2016              Requested by: Elberta Leatherwood, MD [address], [place] [address] PCP: Georges Lynch, MD   Assessment & Plan: Visit Diagnoses:  1. Chronic pain of both knees   2. Unilateral primary osteoarthritis, right knee   3. Unilateral primary osteoarthritis, left knee     Plan: Patient does have BMI of 46. She understands that her weight is an issue. We will submit preauthorization for Monovisc injection. I did tell her that these injections will likely provide her with minimal and temporary relief given Verity of her degenerative joint disease. She understands that total knee replacement would be a permanent solution to this but there are associated risks especially with her increased BMI. She is going to try Visco supplementation first. She will think about her knee replacement. We'll see her back for the injection.  Follow-Up Instructions: Return if symptoms worsen or fail to improve.   Orders:  Orders Placed This Encounter  Procedures  . XR KNEE 3 VIEW LEFT  . XR KNEE 3 VIEW RIGHT   No orders of the defined types were placed in this encounter.     Procedures: No procedures performed   Clinical Data: No additional findings.   Subjective: Chief Complaint  Patient presents with  . Left Knee - Pain  . Right Knee - Pain    Patient comes in with chronic bilateral knee pain for many years. She has had worsening pain in both her knees that are worse with activity. She has had cortisone injections with partial and temporary relief. She is walking with a cane. The knee does give out with pain.  She is hesitant to have any surgical procedure.    Review of Systems Complete review of systems is negative except for history of present illness  Objective: Vital Signs: There were no vitals taken for this visit.  Physical Exam    Constitutional: She is oriented to person, place, and time. She appears well-developed and well-nourished.  HENT:  Head: Normocephalic and atraumatic.  Eyes: EOM are normal.  Neck: Neck supple.  Pulmonary/Chest: Effort normal.  Abdominal: Soft.  Neurological: She is alert and oriented to person, place, and time.  Skin: Skin is warm. Capillary refill takes less than 2 seconds.  Psychiatric: She has a normal mood and affect. Her behavior is normal. Judgment and thought content normal.  Nursing note and vitals reviewed.   Ortho Exam Exam of bilateral knee shows no joint effusion. She has good range of motion. Collaterals and cruciates are stable. Specialty Comments:  No specialty comments available.  Imaging: Xr Knee 3 View Left  Result Date: 11/18/2016 [facility]  Xr Knee 3 View Right  Result Date: 11/18/2016 [facility]    PMFS History: Patient Active Problem List   Diagnosis Date Noted  . Heart murmur on physical examination 12/09/2014  . Sciatica of right side 02/06/2014  . Pain in joint, ankle and foot 07/24/2013  . Bilateral knee pain 11/22/2012  . Leg pain, bilateral 08/16/2011  . LUQ abdominal pain 01/26/2011  . Diabetes mellitus, type II ([place]) 03/08/2008  . LIPOMA 03/31/2007  . HYPERCHOLESTEROLEMIA 12/01/2006  . OBESITY, NOS 12/01/2006  . Depression 12/01/2006  . HYPERTENSION, BENIGN SYSTEMIC 12/01/2006  .  GASTROESOPHAGEAL REFLUX, NO ESOPHAGITIS 12/01/2006   Past Medical History:  Diagnosis Date  . Diabetes mellitus without complication ([place])   . GERD (gastroesophageal reflux disease)   . Hypertension     Family History  Problem Relation Age of Onset  . Heart disease Mother   . Breast cancer Paternal Grandmother   . Breast cancer Paternal Aunt   . Hypertension Father   . Hypertension Sister   . Hypertension Brother     Past Surgical History:  Procedure Laterality Date  . DILATATION & CURETTAGE/HYSTEROSCOPY WITH MYOSURE N/A 08/26/2015    Procedure: DILATATION & CURETTAGE/HYSTEROSCOPY WITH MYOSURE;  Surgeon: [name], MD;  Location: [facility];  Service: Gynecology;  Laterality: N/A;  . TUBAL LIGATION     Social History   Occupational History  . Not on file.   Social History Main Topics  . Smoking status: Former Research scientist (life sciences)  . Smokeless tobacco: Never Used  . Alcohol use 0.0 oz/week  . Drug use: No  . Sexual activity: No

## 2016-12-06 ENCOUNTER — Other Ambulatory Visit: Payer: Self-pay | Admitting: Family Medicine

## 2016-12-09 ENCOUNTER — Telehealth (INDEPENDENT_AMBULATORY_CARE_PROVIDER_SITE_OTHER): Payer: Self-pay | Admitting: *Deleted

## 2016-12-09 NOTE — Telephone Encounter (Signed)
Pt called asking about injection. Pt requesting a call back

## 2016-12-14 NOTE — Telephone Encounter (Signed)
LMOM

## 2016-12-14 NOTE — Telephone Encounter (Signed)
Pt called back stating she can barely move and is wanting this injection.

## 2016-12-14 NOTE — Telephone Encounter (Signed)
Called pt to let her know we are working on this. Monovisc switched systems.

## 2016-12-20 ENCOUNTER — Ambulatory Visit (INDEPENDENT_AMBULATORY_CARE_PROVIDER_SITE_OTHER): Payer: 59 | Admitting: Orthopaedic Surgery

## 2016-12-20 ENCOUNTER — Encounter (INDEPENDENT_AMBULATORY_CARE_PROVIDER_SITE_OTHER): Payer: Self-pay | Admitting: Orthopaedic Surgery

## 2016-12-20 DIAGNOSIS — M1712 Unilateral primary osteoarthritis, left knee: Secondary | ICD-10-CM | POA: Diagnosis not present

## 2016-12-20 DIAGNOSIS — M1711 Unilateral primary osteoarthritis, right knee: Secondary | ICD-10-CM | POA: Diagnosis not present

## 2016-12-20 DIAGNOSIS — M170 Bilateral primary osteoarthritis of knee: Secondary | ICD-10-CM | POA: Insufficient documentation

## 2016-12-20 MED ORDER — TRAMADOL HCL 50 MG PO TABS: 50.0000 mg | ORAL_TABLET | Freq: Four times a day (QID) | ORAL | 3 refills | Status: DC | PRN

## 2016-12-20 NOTE — Progress Notes (Signed)
   Office Visit Note   Patient: [name]           Date of Birth: 01-24-1956           MRN: [id number] Visit Date: 12/20/2016              Requested by: Elberta Leatherwood, MD [address], [place] [address] PCP: Georges Lynch, MD   Assessment & Plan: Visit Diagnoses:  1. Unilateral primary osteoarthritis, right knee   2. Unilateral primary osteoarthritis, left knee     Plan: I prescribed tramadol for her pain. She continues to work on a weight loss. We will bring her back for the Monovisc injections. If she needs something stronger for pain will refer her to a pain clinic  Follow-Up Instructions: Return if symptoms worsen or fail to improve.   Orders:  No orders of the defined types were placed in this encounter.  Meds ordered this encounter  Medications  . traMADol (ULTRAM) 50 MG tablet    Sig: Take 1 tablet (50 mg total) by mouth every 6 (six) hours as needed.    Dispense:  30 tablet    Refill:  3      Procedures: No procedures performed   Clinical Data: No additional findings.   Subjective: Chief Complaint  Patient presents with  . Right Knee - Pain  . Left Knee - Pain    Patient comes back today for bilateral knee pain. Diclofenac does help some. Her Monovisc injection is still pending insurance.    Review of Systems   Objective: Vital Signs: There were no vitals taken for this visit.  Physical Exam  Ortho Exam Bilateral knee exams are stable. Specialty Comments:  No specialty comments available.  Imaging: No results found.   PMFS History: Patient Active Problem List   Diagnosis Date Noted  . Unilateral primary osteoarthritis, left knee 12/20/2016  . Heart murmur on physical examination 12/09/2014  . Sciatica of right side 02/06/2014  . Pain in joint, ankle and foot 07/24/2013  . Bilateral knee pain 11/22/2012  . Leg pain, bilateral 08/16/2011  . LUQ abdominal pain 01/26/2011  . Diabetes mellitus, type II ([place])  03/08/2008  . LIPOMA 03/31/2007  . HYPERCHOLESTEROLEMIA 12/01/2006  . OBESITY, NOS 12/01/2006  . Depression 12/01/2006  . HYPERTENSION, BENIGN SYSTEMIC 12/01/2006  . GASTROESOPHAGEAL REFLUX, NO ESOPHAGITIS 12/01/2006   Past Medical History:  Diagnosis Date  . Diabetes mellitus without complication ([place])   . GERD (gastroesophageal reflux disease)   . Hypertension     Family History  Problem Relation Age of Onset  . Heart disease Mother   . Breast cancer Paternal Grandmother   . Breast cancer Paternal Aunt   . Hypertension Father   . Hypertension Sister   . Hypertension Brother     Past Surgical History:  Procedure Laterality Date  . DILATATION & CURETTAGE/HYSTEROSCOPY WITH MYOSURE N/A 08/26/2015   Procedure: DILATATION & CURETTAGE/HYSTEROSCOPY WITH MYOSURE;  Surgeon: [name], MD;  Location: [facility];  Service: Gynecology;  Laterality: N/A;  . TUBAL LIGATION     Social History   Occupational History  . Not on file.   Social History Main Topics  . Smoking status: Former Research scientist (life sciences)  . Smokeless tobacco: Never Used  . Alcohol use 0.0 oz/week  . Drug use: No  . Sexual activity: No

## 2016-12-20 NOTE — Telephone Encounter (Signed)
Pt was seen today.

## 2017-02-06 ENCOUNTER — Other Ambulatory Visit: Payer: Self-pay | Admitting: Family Medicine

## 2017-02-06 DIAGNOSIS — K219 Gastro-esophageal reflux disease without esophagitis: Secondary | ICD-10-CM

## 2017-02-06 DIAGNOSIS — E669 Obesity, unspecified: Secondary | ICD-10-CM

## 2017-02-16 ENCOUNTER — Encounter: Payer: Self-pay | Admitting: Gynecology

## 2017-02-22 ENCOUNTER — Encounter: Payer: Self-pay | Admitting: Podiatry

## 2017-02-22 ENCOUNTER — Ambulatory Visit (INDEPENDENT_AMBULATORY_CARE_PROVIDER_SITE_OTHER): Payer: 59 | Admitting: Podiatry

## 2017-02-22 VITALS — BP 165/87 | HR 72

## 2017-02-22 DIAGNOSIS — B351 Tinea unguium: Secondary | ICD-10-CM

## 2017-02-22 DIAGNOSIS — M79675 Pain in left toe(s): Secondary | ICD-10-CM | POA: Diagnosis not present

## 2017-02-22 DIAGNOSIS — E119 Type 2 diabetes mellitus without complications: Secondary | ICD-10-CM

## 2017-02-22 DIAGNOSIS — M79674 Pain in right toe(s): Secondary | ICD-10-CM | POA: Diagnosis not present

## 2017-02-22 NOTE — Progress Notes (Signed)
   Subjective:    Patient ID: [name], female    DOB: [birth year], 60 y.o.   MRN: [id number]  HPI This diabetic patient presents today requesting diabetic Diaz exam and debridement of mycotic toenails. Patient denies any recent podiatric care and has been self trimming the toenails, however, the nails of income progressively more difficult for patient to trim herself. Also, patient is complaining of general discomfort in the toenails walking wearing shoes. Patient is diabetic approximately 15 years and denies history of claudication or amputation Patient is a former smoker discontinued 20 years prior  The patient's last visit in our office was 09/16/2014  Review of Systems  All other systems reviewed and are negative.      Objective:   Physical Exam  Approximately 5 feet 4 inches 252 pounds per patient Orientated 3  Vascular: DP and PT pulses 2/4 bilaterally Capillary reflex immediate bilaterally  Neurological: Sensation to 10 g monofilament wire intact 2/5 right and 5/5 left Vibratory sensation reactive bilaterally Ankle reflexes reactive bilaterally  Dermatological: No open skin lesions bilaterally Skin texture within normal limits bilaterally The toenails are elongated, brittle, discolored and tender to direct palpation 6-10  Musculoskeletal: There is no restriction ankle, subtalar, midtarsal joints bilaterally Manual motor testing dorsi flexion, plantar flexion, inversion, eversion 5/5 bilaterally       Assessment & Plan:   Assessment: Diabetic without obvious Diaz complications Symptomatic mycotic toenails 6-10  Plan: Debridement of toenails 6-10 mechanically and electrically without any bleeding  Reappoint 3 months

## 2017-02-22 NOTE — Patient Instructions (Signed)

## 2017-03-04 ENCOUNTER — Encounter: Payer: Self-pay | Admitting: Podiatry

## 2017-03-04 ENCOUNTER — Ambulatory Visit (INDEPENDENT_AMBULATORY_CARE_PROVIDER_SITE_OTHER): Payer: 59 | Admitting: Podiatry

## 2017-03-04 DIAGNOSIS — L600 Ingrowing nail: Secondary | ICD-10-CM

## 2017-03-04 DIAGNOSIS — E119 Type 2 diabetes mellitus without complications: Secondary | ICD-10-CM | POA: Diagnosis not present

## 2017-03-04 MED ORDER — MUPIROCIN 2 % EX OINT: 1.0000 "application " | TOPICAL_OINTMENT | Freq: Two times a day (BID) | CUTANEOUS | 2 refills | Status: DC

## 2017-03-04 MED ORDER — SULFAMETHOXAZOLE-TRIMETHOPRIM 800-160 MG PO TABS: 1.0000 | ORAL_TABLET | Freq: Two times a day (BID) | ORAL | 0 refills | Status: DC

## 2017-03-08 ENCOUNTER — Telehealth: Payer: Self-pay | Admitting: Podiatry

## 2017-03-08 NOTE — Telephone Encounter (Signed)
Patient called wanting to ask about medication interactions with the medications Dr. Jacqualyn Posey prescribed 03/04/2017 which were mupirocin ointment (Bactroban 2%) and sulfamethoxazole-trimethoprim (Bactrim DS) 800 - 160 MG tablet.

## 2017-03-09 NOTE — Telephone Encounter (Signed)
I spoke with pt and she states called Dr. Amalia Hailey over the weekend and he ordered the medications for her, and she wanted to know if there was a problem between the antibiotic and her diabetes and enalapril. I told pt that she would only be on the oral antibiotic for a short course, and more than likely no problems, but if did find she was having a problem with her glucose or B/P to contact our office. Pt states understanding.

## 2017-03-10 NOTE — Progress Notes (Signed)
Subjective: 61 year old female presents the office today for concerns of a possible infection, ingrown sounds a left big toe she points the lateral nail border. She says she recently had her nails cut a couple days later she is noted that her toenails started to get darker on the edge as well as some clear drainage. She states that she discuss with Dr. Amalia Hailey the possibility of infection. She denies any recent injury or trauma. She's had no treatment. Denies any systemic complaints such as fevers, chills, nausea, vomiting. No acute changes since last appointment, and no other complaints at this time.   Last A1c was 8.1  Objective: AAO x3, NAD DP/PT pulses palpable bilaterally, CRT less than 3 seconds On the lateral aspect of the left hallux toenail nail. The incurvated with small amount of clear bloody drainage expressed there is no pus. There is no swelling erythema, ascending cellulitis. There is no fluctuance, crepitus, malodor. Mild edema along the nail border. There is also mild incurvation along the medial nail border but overall the nails dystrophic, discolored. No open lesions or pre-ulcerative lesions.  No pain with calf compression, swelling, warmth, erythema  Assessment: Ingrown toenail left hallux  Plan: -All treatment options discussed with the patient including all alternatives, risks, complications.  -Discussed partial nail avulsion but she wishes to hold off. Additional debride the symptomatic portion of the ingrown toenail without any couple occasions or bleeding slant back procedure. Recommend antibiotic ointment dressing changes daily. -Monitor for any clinical signs or symptoms of infection and directed to call the office immediately should any occur or go to the ER. -Patient encouraged to call the office with any questions, concerns, change in symptoms.   Celesta Gentile, DPM

## 2017-03-14 ENCOUNTER — Other Ambulatory Visit: Payer: Self-pay | Admitting: Family Medicine

## 2017-03-17 ENCOUNTER — Ambulatory Visit (INDEPENDENT_AMBULATORY_CARE_PROVIDER_SITE_OTHER): Payer: 59 | Admitting: Podiatry

## 2017-03-17 ENCOUNTER — Encounter: Payer: Self-pay | Admitting: Podiatry

## 2017-03-17 DIAGNOSIS — L600 Ingrowing nail: Secondary | ICD-10-CM | POA: Diagnosis not present

## 2017-03-20 NOTE — Progress Notes (Signed)
Subjective: 61 year old female presents the office today for follow-up evaluation of left big toenail ingrown toenail localized infection. She states that the area is doing much better and her pain is significantly improved. She denies any drainage or pus any redness or swelling to her foot. She has no new concerns. Denies any systemic complaints such as fevers, chills, nausea, vomiting. No acute changes since last appointment, and no other complaints at this time.   Objective: AAO x3, NAD DP/PT pulses palpable bilaterally, CRT less than 3 seconds Left hallux toenail history dystrophic, discolored, hypertrophic. She is a small amount of tenderness within the corner of the left big toe along the lateral aspect. Upon evaluation of this area there is no toenail present within this area and there is no drainage or pus or any open sore. This is from an ingrown toenail had been removed previously. I did to further debride the toenail as there was still incurvation on the proximal medial lateral nail borders have there is no tenderness this area. Additionally debrided this area to help prevent any future problems and there is no drainage or pus expressed. There is no edema, erythema to the toe. There is no malodor. Overall her symptoms are much improved. There is no other open lesions or pre-ulcerative lesions. There is no pain with calf compression, swelling, warmth, erythema.  Assessment: Left hallux onychodystrophy, ingrown toenail  Plan: -Treatment options discussed including all alternatives, risks, and complications -Overall she is doing much better. Additionally debrided the left hallux toenail any complications or bleeding. It has had an infection. The only area of tenderness that she has there is no nail present within this area and this is where she had the ingrown toenail previously. I'll leave that due to procedures can help this pain. Recommend Neosporin and a bandage daily. Monitor any recurrence  or any signs or symptoms of infection. -Follow-up as scheduled or sooner if needed. Call any questions or concerns meantime. She agrees to this plan.  Celesta Gentile, DPM

## 2017-05-19 ENCOUNTER — Ambulatory Visit: Payer: 59 | Admitting: Podiatry

## 2017-05-25 ENCOUNTER — Ambulatory Visit (INDEPENDENT_AMBULATORY_CARE_PROVIDER_SITE_OTHER): Payer: PRIVATE HEALTH INSURANCE | Admitting: Podiatry

## 2017-05-25 ENCOUNTER — Encounter: Payer: Self-pay | Admitting: Podiatry

## 2017-05-25 DIAGNOSIS — B351 Tinea unguium: Secondary | ICD-10-CM

## 2017-05-25 DIAGNOSIS — M79674 Pain in right toe(s): Secondary | ICD-10-CM

## 2017-05-25 DIAGNOSIS — M79675 Pain in left toe(s): Secondary | ICD-10-CM

## 2017-05-25 DIAGNOSIS — E119 Type 2 diabetes mellitus without complications: Secondary | ICD-10-CM

## 2017-05-25 NOTE — Progress Notes (Signed)
   Subjective:    Patient ID: [name], female    DOB: 1956/09/24, 61 y.o.   MRN: [id number]  HPI    Review of Systems  Skin:       Right big toenail is infected   All other systems reviewed and are negative.      Objective:   Physical Exam        Assessment & Plan:

## 2017-05-25 NOTE — Patient Instructions (Signed)
He has of the left great toenail is ingrowing and will continue there is no pus production in the area reactive sign of bacterial infection. As your hemoglobin A1c is 9/10 will defer on permanent nail surgery Okay to apply topical antibiotic ointment and Band-Aid to the edges of the left great toenail daily until comfortable   Diabetes and Foot Care Diabetes may cause you to have problems because of poor blood supply (circulation) to your feet and legs. This may cause the skin on your feet to become thinner, break easier, and heal more slowly. Your skin may become dry, and the skin may peel and crack. You may also have nerve damage in your legs and feet causing decreased feeling in them. You may not notice minor injuries to your feet that could lead to infections or more serious problems. Taking care of your feet is one of the most important things you can do for yourself. Follow these instructions at home:  Wear shoes at all times, even in the house. Do not go barefoot. Bare feet are easily injured.  Check your feet daily for blisters, cuts, and redness. If you cannot see the bottom of your feet, use a mirror or ask someone for help.  Wash your feet with warm water (do not use hot water) and mild soap. Then pat your feet and the areas between your toes until they are completely dry. Do not soak your feet as this can dry your skin.  Apply a moisturizing lotion or petroleum jelly (that does not contain alcohol and is unscented) to the skin on your feet and to dry, brittle toenails. Do not apply lotion between your toes.  Trim your toenails straight across. Do not dig under them or around the cuticle. File the edges of your nails with an emery board or nail file.  Do not cut corns or calluses or try to remove them with medicine.  Wear clean socks or stockings every day. Make sure they are not too tight. Do not wear knee-high stockings since they may decrease blood flow to your legs.  Wear shoes  that fit properly and have enough cushioning. To break in new shoes, wear them for just a few hours a day. This prevents you from injuring your feet. Always look in your shoes before you put them on to be sure there are no objects inside.  Do not cross your legs. This may decrease the blood flow to your feet.  If you find a minor scrape, cut, or break in the skin on your feet, keep it and the skin around it clean and dry. These areas may be cleansed with mild soap and water. Do not cleanse the area with peroxide, alcohol, or iodine.  When you remove an adhesive bandage, be sure not to damage the skin around it.  If you have a wound, look at it several times a day to make sure it is healing.  Do not use heating pads or hot water bottles. They may burn your skin. If you have lost feeling in your feet or legs, you may not know it is happening until it is too late.  Make sure your health care provider performs a complete foot exam at least annually or more often if you have foot problems. Report any cuts, sores, or bruises to your health care provider immediately. Contact a health care provider if:  You have an injury that is not healing.  You have cuts or breaks in the skin.  You have an ingrown nail.  You notice redness on your legs or feet.  You feel burning or tingling in your legs or feet.  You have pain or cramps in your legs and feet.  Your legs or feet are numb.  Your feet always feel cold. Get help right away if:  There is increasing redness, swelling, or pain in or around a wound.  There is a red line that goes up your leg.  Pus is coming from a wound.  You develop a fever or as directed by your health care provider.  You notice a bad smell coming from an ulcer or wound. This information is not intended to replace advice given to you by your health care provider. Make sure you discuss any questions you have with your health care provider. Document Released: 09/17/2000  Document Revised: 02/26/2016 Document Reviewed: 02/27/2013 Elsevier Interactive Patient Education  2017 [employer].

## 2017-05-25 NOTE — Progress Notes (Signed)
Patient ID: [name], female   DOB: [birth year], 61 y.o.   MRN: [id number]   Subjective: Patient presents for scheduled visit for debridement of toenails or cough walking wearing shoes. Patient had history of local infection left hallux nail on the visit of 03/17/2017 2 treated with debridement topical antibiotic ointments. Patient states that the areas improving, however still uncomfortable with direct pressure  Patient is diabetic approximately 15 years and denies history of claudication or amputation Patient is a former smoker discontinued 20 years prior   Objective  Approximately 5 feet 4 inches 252 pounds per patient Orientated 3  Vascular: DP and PT pulses 2/4 bilaterally Capillary reflex immediate bilaterally  Neurological: Sensation to 10 g monofilament wire intact 2/5 right and 5/5 left Vibratory sensation reactive bilaterally Ankle reflexes reactive bilaterally  Dermatological: No open skin lesions bilaterally Skin texture within normal limits bilaterally The toenails are elongated, brittle, discolored and tender to direct palpation 6-10 The lateral margin of the left hallux toenails incurvated and tender direct pressure. 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 Instructed patient apply topical antibiotic ointment and Band-Aid daily to the left hallux toenail until comfortable. To firm permanent nail surgery into A1c's improve  Reappoint 3 months

## 2017-05-31 ENCOUNTER — Encounter: Payer: Self-pay | Admitting: Family Medicine

## 2017-05-31 ENCOUNTER — Ambulatory Visit (INDEPENDENT_AMBULATORY_CARE_PROVIDER_SITE_OTHER): Payer: 59 | Admitting: Family Medicine

## 2017-05-31 VITALS — BP 122/76 | HR 77 | Temp 98.2°F | Ht 63.0 in | Wt 257.0 lb

## 2017-05-31 DIAGNOSIS — Z0000 Encounter for general adult medical examination without abnormal findings: Secondary | ICD-10-CM

## 2017-05-31 DIAGNOSIS — K219 Gastro-esophageal reflux disease without esophagitis: Secondary | ICD-10-CM | POA: Diagnosis not present

## 2017-05-31 DIAGNOSIS — E1169 Type 2 diabetes mellitus with other specified complication: Secondary | ICD-10-CM

## 2017-05-31 DIAGNOSIS — I10 Essential (primary) hypertension: Secondary | ICD-10-CM | POA: Diagnosis not present

## 2017-05-31 DIAGNOSIS — E782 Mixed hyperlipidemia: Secondary | ICD-10-CM

## 2017-05-31 DIAGNOSIS — Z6841 Body Mass Index (BMI) 40.0 and over, adult: Secondary | ICD-10-CM

## 2017-05-31 DIAGNOSIS — E1165 Type 2 diabetes mellitus with hyperglycemia: Secondary | ICD-10-CM | POA: Diagnosis not present

## 2017-05-31 DIAGNOSIS — IMO0001 Reserved for inherently not codable concepts without codable children: Secondary | ICD-10-CM

## 2017-05-31 LAB — POCT GLYCOSYLATED HEMOGLOBIN (HGB A1C): Hemoglobin A1C: 7.9

## 2017-05-31 MED ORDER — ATORVASTATIN CALCIUM 40 MG PO TABS: 40.0000 mg | ORAL_TABLET | Freq: Every day | ORAL | 3 refills | Status: DC

## 2017-05-31 MED ORDER — METFORMIN HCL ER 500 MG PO TB24: 1000.0000 mg | ORAL_TABLET | Freq: Two times a day (BID) | ORAL | 3 refills | Status: DC

## 2017-05-31 NOTE — Assessment & Plan Note (Addendum)
Chronic. Uncontrolled. History of myalgias with lovastatin. ASCVD recommending high intensity. --Initiating atorvastatin 40 mg daily --Discussed reasons to discontinue medication including myalgias --RTC one month

## 2017-05-31 NOTE — Assessment & Plan Note (Addendum)
Chronic. Patient making efforts to cut out carbohydrates and abstain from sodas. Has successfully lost approximately 10 pounds over the past 6 months. --Discussed bariatric clinic, patient declined but would consider in future --Encouraged patient to continue diet and weight loss plan

## 2017-05-31 NOTE — Assessment & Plan Note (Addendum)
Chronic. Improving, though suboptimal. A1c 7.9. On metformin only, not at maximal dose. --Switching to metformin-XR 1000 mg twice daily --DM foot exam performed --Will obtain CBC with differential, CMET, TSH, LDL

## 2017-05-31 NOTE — Patient Instructions (Signed)
Thank you for coming in to see [place] today. Please see below to review our plan for today's visit.  1. I have started you on a medication called atorvastatin (Lipitor). See below for information on this medication. If you experience new onset pains on your extremities particularly in the muscles, please stop this medication immediately. 2. I have switched you to an extended release form of metformin. Take 2 tablets twice daily with meals. This should be more gentle on your stomach. 3. I will call you if your lab results are abnormal, otherwise expect to receive them in the mail.  Please call the clinic at [phone] if your symptoms worsen or you have any concerns. It was my pleasure to see you. -- Harriet Butte, [place], PGY-2  Atorvastatin tablets What is this medicine? ATORVASTATIN (a TORE va sta tin) is known as a HMG-CoA reductase inhibitor or 'statin'. It lowers the level of cholesterol and triglycerides in the blood. This drug may also reduce the risk of heart attack, stroke, or other health problems in patients with risk factors for heart disease. Diet and lifestyle changes are often used with this drug. This medicine may be used for other purposes; ask your health care provider or pharmacist if you have questions. COMMON BRAND NAME(S): Lipitor What should I tell my health care provider before I take this medicine? They need to know if you have any of these conditions: -frequently drink alcoholic beverages -history of stroke, TIA -kidney disease -liver disease -muscle aches or weakness -other medical condition -an unusual or allergic reaction to atorvastatin, other medicines, foods, dyes, or preservatives -pregnant or trying to get pregnant -breast-feeding How should I use this medicine? Take this medicine by mouth with a glass of water. Follow the directions on the prescription label. You can take this medicine with or without food. Take your doses at  regular intervals. Do not take your medicine more often than directed. Talk to your pediatrician regarding the use of this medicine in children. While this drug may be prescribed for children as young as 15 years old for selected conditions, precautions do apply. Overdosage: If you think you have taken too much of this medicine contact a poison control center or emergency room at once. NOTE: This medicine is only for you. Do not share this medicine with others. What if I miss a dose? If you miss a dose, take it as soon as you can. If it is almost time for your next dose, take only that dose. Do not take double or extra doses. What may interact with this medicine? Do not take this medicine with any of the following medications: -red yeast rice -telaprevir -telithromycin -voriconazole This medicine may also interact with the following medications: -alcohol -antiviral medicines for HIV or AIDS -boceprevir -certain antibiotics like clarithromycin, erythromycin, troleandomycin -certain medicines for cholesterol like fenofibrate or gemfibrozil -cimetidine -clarithromycin -colchicine -cyclosporine -digoxin -female hormones, like estrogens or progestins and birth control pills -grapefruit juice -medicines for fungal infections like fluconazole, itraconazole, ketoconazole -niacin -rifampin -spironolactone This list may not describe all possible interactions. Give your health care provider a list of all the medicines, herbs, non-prescription drugs, or dietary supplements you use. Also tell them if you smoke, drink alcohol, or use illegal drugs. Some items may interact with your medicine. What should I watch for while using this medicine? Visit your doctor or health care professional for regular check-ups. You may need regular tests to make sure your liver is working  properly. Tell your doctor or health care professional right away if you get any unexplained muscle pain, tenderness, or weakness,  especially if you also have a fever and tiredness. Your doctor or health care professional may tell you to stop taking this medicine if you develop muscle problems. If your muscle problems do not go away after stopping this medicine, contact your health care professional. This drug is only part of a total heart-health program. Your doctor or a dietician can suggest a low-cholesterol and low-fat diet to help. Avoid alcohol and smoking, and keep a proper exercise schedule. Do not use this drug if you are pregnant or breast-feeding. Serious side effects to an unborn child or to an infant are possible. Talk to your doctor or pharmacist for more information. This medicine may affect blood sugar levels. If you have diabetes, check with your doctor or health care professional before you change your diet or the dose of your diabetic medicine. If you are going to have surgery tell your health care professional that you are taking this drug. What side effects may I notice from receiving this medicine? Side effects that you should report to your doctor or health care professional as soon as possible: -allergic reactions like skin rash, itching or hives, swelling of the face, lips, or tongue -dark urine -fever -joint pain -muscle cramps, pain -redness, blistering, peeling or loosening of the skin, including inside the mouth -trouble passing urine or change in the amount of urine -unusually weak or tired -yellowing of eyes or skin Side effects that usually do not require medical attention (report to your doctor or health care professional if they continue or are bothersome): -constipation -heartburn -stomach gas, pain, upset This list may not describe all possible side effects. Call your doctor for medical advice about side effects. You may report side effects to FDA at 1-800-FDA-1088. Where should I keep my medicine? Keep out of the reach of children. Store at room temperature between 20 to 25 degrees C  (68 to 77 degrees F). Throw away any unused medicine after the expiration date. NOTE: This sheet is a summary. It may not cover all possible information. If you have questions about this medicine, talk to your doctor, pharmacist, or health care provider.  2018 Elsevier/Gold Standard (2011-08-10 50:35:46)

## 2017-05-31 NOTE — Progress Notes (Signed)
Subjective:   Patient ID: [name]    DOB: 1955/11/05, 61 y.o. female   MRN: [id number]  CC: "Diabetes"  HPI: [name] is a 61 y.o. female who presents to clinic today for diabetes. Problems discussed today are as follows:  Diabetes: Patient has made efforts to reduce carbohydrate intake and has abstained from sodas. She is seeing a podiatrist for her Diaz care. She is currently taking metformin 3 times daily but continues to have some discomfort in her stomach and is unsure if it is related to the metformin. ROS: Denies nausea or vomiting, abdominal pain, decreased sensation, loss of motor function.  Hyperlipidemia: Patient previously experienced myalgias with lovastatin. Was prescribed pravastatin but never took medication due to concern for myalgias. She denies history of MI or stroke/TIA symptoms. She is a previous smoker.  Obesity: Patient has made efforts to reduce carbohydrate intake and removed sodas from diet. She has lost approximately 18 pounds since last year.  Complete ROS performed, see HPI for pertinent.  [place]: Uncontrolled NIDDM, HTN, GERD, morbid obesity. Surgical history tubal ligation, D&C with myosure (age 23 for endometrial polyp). Family history heart disease, HTN. Smoking status reviewed. Medications reviewed.  Objective:   BP 122/76   Pulse 77   Temp 98.2 F (36.8 C) (Oral)   Ht _0  (1.6 m)   Wt 257 lb (116.6 kg)   SpO2 99%   BMI 45.53 kg/m  Vitals and nursing note reviewed.  General: morbidly obese, well nourished, well developed, in no acute distress with non-toxic appearance HEENT: normocephalic, atraumatic, moist mucous membranes Neck: supple, non-tender without lymphadenopathy CV: regular rate and rhythm without murmurs, rubs, or gallops, no lower extremity edema Lungs: clear to auscultation bilaterally with normal work of breathing Abdomen: soft, non-tender, non-distended, no masses or organomegaly palpable, normoactive  bowel sounds Skin: warm, dry, no rashes or lesions, cap refill < 2 seconds Extremities: warm and well perfused, normal tone, Diaz exam intact sensation with small bandage on left big toe from healing cut  Assessment & Plan:   Uncontrolled diabetes mellitus (HCC) Chronic. Improving, though suboptimal. A1c 7.9. On metformin only, not at maximal dose. --Switching to metformin-XR 1000 mg twice daily --DM Diaz exam performed --Will obtain CBC with differential, CMET, TSH, LDL  Mixed hyperlipidemia due to type 2 diabetes mellitus (HCC) Chronic. Uncontrolled. History of myalgias with lovastatin. ASCVD recommending high intensity. --Initiating atorvastatin 40 mg daily --Discussed reasons to discontinue medication including myalgias --RTC one month  Morbid obesity with BMI of 45.0-49.9, adult (HCC) Chronic. Patient making efforts to cut out carbohydrates and abstain from sodas. Has successfully lost approximately 10 pounds over the past 6 months. --Discussed bariatric clinic, patient declined but would consider in future --Encouraged patient to continue diet and weight loss plan  Orders Placed This Encounter  Procedures  . CBC with Differential/Platelet  . LDL cholesterol, direct  . CMP14+EGFR  . TSH  . POCT HgB A1C (CPT 83036)   Meds ordered this encounter  Medications  . atorvastatin (LIPITOR) 40 MG tablet    Sig: Take 1 tablet (40 mg total) by mouth daily.    Dispense:  90 tablet    Refill:  3  . metFORMIN (GLUCOPHAGE XR) 500 MG 24 hr tablet    Sig: Take 2 tablets (1,000 mg total) by mouth 2 (two) times daily with a meal.    Dispense:  120 tablet    Refill:  [place], DO [facility], PGY-2 05/31/2017 12:49 PM

## 2017-06-01 ENCOUNTER — Encounter: Payer: Self-pay | Admitting: Family Medicine

## 2017-06-01 LAB — CBC WITH DIFFERENTIAL/PLATELET
BASOS ABS: 0 10*3/uL (ref 0.0–0.2)
Basos: 0 %
EOS (ABSOLUTE): 0.3 10*3/uL (ref 0.0–0.4)
Eos: 4 %
Hematocrit: 37.5 % (ref 34.0–46.6)
Hemoglobin: 12.4 g/dL (ref 11.1–15.9)
Immature Grans (Abs): 0 10*3/uL (ref 0.0–0.1)
Immature Granulocytes: 0 %
LYMPHS ABS: 3 10*3/uL (ref 0.7–3.1)
LYMPHS: 32 %
MCH: 28.4 pg (ref 26.6–33.0)
MCHC: 33.1 g/dL (ref 31.5–35.7)
MCV: 86 fL (ref 79–97)
Monocytes Absolute: 0.4 10*3/uL (ref 0.1–0.9)
Monocytes: 4 %
NEUTROS ABS: 5.6 10*3/uL (ref 1.4–7.0)
Neutrophils: 60 %
Platelets: 357 10*3/uL (ref 150–379)
RBC: 4.36 x10E6/uL (ref 3.77–5.28)
RDW: 14.6 % (ref 12.3–15.4)
WBC: 9.4 10*3/uL (ref 3.4–10.8)

## 2017-06-01 LAB — CMP14+EGFR
A/G RATIO: 1.3 (ref 1.2–2.2)
ALBUMIN: 4 g/dL (ref 3.6–4.8)
ALT: 11 IU/L (ref 0–32)
AST: 17 IU/L (ref 0–40)
Alkaline Phosphatase: 80 IU/L (ref 39–117)
BUN/Creatinine Ratio: 21 (ref 12–28)
BUN: 17 mg/dL (ref 8–27)
Bilirubin Total: 0.3 mg/dL (ref 0.0–1.2)
CALCIUM: 9.2 mg/dL (ref 8.7–10.3)
CO2: 24 mmol/L (ref 20–29)
Chloride: 101 mmol/L (ref 96–106)
Creatinine, Ser: 0.82 mg/dL (ref 0.57–1.00)
GFR calc non Af Amer: 77 mL/min/{1.73_m2} (ref >59)
GFR, EST AFRICAN AMERICAN: 89 mL/min/{1.73_m2} (ref >59)
GLOBULIN, TOTAL: 3.1 g/dL (ref 1.5–4.5)
Glucose: 233 mg/dL — ABNORMAL HIGH (ref 65–99)
POTASSIUM: 4.2 mmol/L (ref 3.5–5.2)
SODIUM: 144 mmol/L (ref 134–144)
TOTAL PROTEIN: 7.1 g/dL (ref 6.0–8.5)

## 2017-06-01 LAB — TSH: TSH: 3.1 u[IU]/mL (ref 0.450–4.500)

## 2017-06-01 LAB — LDL CHOLESTEROL, DIRECT: LDL Direct: 179 mg/dL — ABNORMAL HIGH (ref 0–99)

## 2017-06-18 ENCOUNTER — Other Ambulatory Visit: Payer: Self-pay | Admitting: Family Medicine

## 2017-07-03 NOTE — Progress Notes (Deleted)
   Subjective:   Patient ID: [name]    DOB: [birth year], 61 y.o. female   MRN: [id number]  CC: "***"  HPI: [name] is a 61 y.o. female who presents to clinic today ***. Problems discussed today are as follows:  ***: *** ROS: ***  ***Last seen 8/28 for uncontrolled DM. Switched to Metformin-XR 1000 mg BID. Started atorvastatin. HM includes colonoscopy, eye exam, Diaz, mam. Needs flu.  Complete ROS performed, see HPI for pertinent.  [place]: Uncontrolled NIDDM, HTN, GERD, morbid obesity. Surgical history tubal ligation, D&C with myosure (age 49 for endometrial polyp). Family history heart disease, HTN. Smoking status reviewed. Medications reviewed.  Objective:   There were no vitals taken for this visit. Vitals and nursing note reviewed.  General: well nourished, well developed, in no acute distress with non-toxic appearance HEENT: normocephalic, atraumatic, moist mucous membranes Neck: supple, non-tender without lymphadenopathy CV: regular rate and rhythm without murmurs, rubs, or gallops, no lower extremity edema Lungs: clear to auscultation bilaterally with normal work of breathing Abdomen: soft, non-tender, non-distended, no masses or organomegaly palpable, normoactive bowel sounds Skin: warm, dry, no rashes or lesions, cap refill < 2 seconds Extremities: warm and well perfused, normal tone  Assessment & Plan:   No problem-specific Assessment & Plan notes found for this encounter.  No orders of the defined types were placed in this encounter.  No orders of the defined types were placed in this encounter.   Harriet Butte, [place], PGY-2 07/03/2017 4:39 PM

## 2017-07-04 ENCOUNTER — Ambulatory Visit: Payer: 59 | Admitting: Family Medicine

## 2017-07-11 ENCOUNTER — Encounter: Payer: Self-pay | Admitting: Family Medicine

## 2017-07-11 ENCOUNTER — Ambulatory Visit (INDEPENDENT_AMBULATORY_CARE_PROVIDER_SITE_OTHER): Payer: 59 | Admitting: Family Medicine

## 2017-07-11 VITALS — BP 134/86 | HR 92 | Temp 98.4°F | Wt 255.0 lb

## 2017-07-11 DIAGNOSIS — I10 Essential (primary) hypertension: Secondary | ICD-10-CM | POA: Diagnosis not present

## 2017-07-11 DIAGNOSIS — J301 Allergic rhinitis due to pollen: Secondary | ICD-10-CM

## 2017-07-11 DIAGNOSIS — Z6841 Body Mass Index (BMI) 40.0 and over, adult: Secondary | ICD-10-CM

## 2017-07-11 DIAGNOSIS — E1169 Type 2 diabetes mellitus with other specified complication: Secondary | ICD-10-CM | POA: Diagnosis not present

## 2017-07-11 DIAGNOSIS — J309 Allergic rhinitis, unspecified: Secondary | ICD-10-CM | POA: Insufficient documentation

## 2017-07-11 DIAGNOSIS — E1165 Type 2 diabetes mellitus with hyperglycemia: Secondary | ICD-10-CM

## 2017-07-11 DIAGNOSIS — Z23 Encounter for immunization: Secondary | ICD-10-CM

## 2017-07-11 DIAGNOSIS — E782 Mixed hyperlipidemia: Secondary | ICD-10-CM

## 2017-07-11 MED ORDER — FLUTICASONE PROPIONATE 50 MCG/ACT NA SUSP: 1.0000 | Freq: Every day | NASAL | 5 refills | Status: DC

## 2017-07-11 NOTE — Assessment & Plan Note (Addendum)
Acute on chronic. Patient out of Flonase with symptom control with previous use. --Given refill of Flonase 50 g/ACT nasal spray

## 2017-07-11 NOTE — Patient Instructions (Signed)
Thank you for coming in to see [place] today. Please see below to review our plan for today's visit.  1. Continue taking the atorvastatin every other day. Return to the clinic in one month around early November to test your LDL level. You do not need to fast during this test. You also do not need to make an appointment for this. 2. I have sent in the Flonase to your pharmacy. 3. Continue taking the metformin as prescribed. I would encourage you to find ways to incorporate some form of exercise into your weekly routine. 4. We will recheck out to your eye doctor to obtain your records. 5. I have given you a form for you to call and schedule a screening colonoscopy. This should be covered as long as you you for your screening. Please clarify this with the office. If you're having difficult with this, please let our office know.  Please call the clinic at [phone] if your symptoms worsen or you have any concerns. It was my pleasure to see you. -- Harriet Butte, [place], PGY-2

## 2017-07-11 NOTE — Assessment & Plan Note (Deleted)
Chronic. Controlled. Nonsmoker. --Continue Lopressor 50 mg daily, enalapril 20 mg daily, and chlorthalidone 50 mg daily

## 2017-07-11 NOTE — Assessment & Plan Note (Addendum)
Chronic. Recently transitioned to high intensity statin. Patient seems to be having similar side effect his lovastatin concerning for myalgias however resolved with every other day use. --Discussed risks versus benefits with patient and decided to move forward with every other day use of Lipitor 40 mg daily --LDL in one month

## 2017-07-11 NOTE — Progress Notes (Signed)
   Subjective:   Patient ID: [name]    DOB: 1955/10/31, 61 y.o. female   MRN: [id number]  CC: "Diabetes follow-up"  HPI: [name] is a 61 y.o. female who presents to clinic today for diabetes. Problems discussed today are as follows:  Diabetes: Patient states she initially had some stomach cramping when starting the metformin extended release. She took Activia along with the medication with resolution of symptoms after 1 week. Patient has been able to tolerate dose without change upset. She does state sometimes she sees the pill in her stool. She has not made changes to her absent exercise regimen. ROS: Denies change in vision, nausea or vomiting, change in motor or sensory function, abdominal pain, diarrhea.  Hyperlipidemia: Experiencing feelings of lower extremity weakness without pain bilaterally similar to lovastatin use with Lipitor. Change dose to every other day with resolution of symptoms.  Allergies: Patient experiencing watery eyes with clear to discharge with associated itching. Also endorsing some symptoms of postnasal drip consistent with seasonal allergies. She ran out of her Flonase. ROS: Denies fevers or chills, purulent discharge, headache, cough, shortness of breath.  Complete ROS performed, see HPI for pertinent.  [place]: Uncontrolled NIDDM, HTN, GERD, morbid obesity. Surgical history tubal ligation, D&C with myosure (age 69 for endometrial polyp). Family history heart disease, HTN. Smoking status reviewed. Medications reviewed.  Objective:   BP 134/86   Pulse 92   Temp 98.4 F (36.9 C) (Oral)   Wt 255 lb (115.7 kg)   SpO2 91%   BMI 45.17 kg/m  Vitals and nursing note reviewed.  General: morbidly obese, in no acute distress with non-toxic appearance HEENT: normocephalic, atraumatic, moist mucous membranes Neck: supple, non-tender without lymphadenopathy CV: regular rate and rhythm without murmurs, rubs, or gallops, no lower extremity  edema Lungs: clear to auscultation bilaterally with normal work of breathing Abdomen: soft, non-tender, non-distended, no masses or organomegaly palpable, normoactive bowel sounds Skin: warm, dry, no rashes or lesions, cap refill < 2 seconds Extremities: warm and well perfused, normal tone, small lipoma of left wrist  Assessment & Plan:   Uncontrolled diabetes mellitus (HCC) Chronic. Controlled. Seems to be tolerating metformin-XR well. Patient still thinking about bariatric clinic but has yet to make a decision concerning a referral. Not making efforts to exercise. --Continue metformin-XR 1000 mg twice daily --RTC 2 months for recheck of A1c  Mixed hyperlipidemia due to type 2 diabetes mellitus (HCC) Chronic. Recently transitioned to high intensity statin. Patient seems to be having similar side effect his lovastatin concerning for myalgias however resolved with every other day use. --Discussed risks versus benefits with patient and decided to move forward with every other day use of Lipitor 40 mg daily --LDL in one month  Allergic rhinitis Acute on chronic. Patient out of Flonase with symptom control with previous use. --Given refill of Flonase 50 g/ACT nasal spray  Orders Placed This Encounter  Procedures  . LDL cholesterol, direct    Standing Status:   Future    Standing Expiration Date:   07/11/2018   Meds ordered this encounter  Medications  . fluticasone (FLONASE) 50 MCG/ACT nasal spray    Sig: Place 1 spray into both nostrils daily.    Dispense:  16 g    Refill:  [place], [place], PGY-2 07/11/2017 4:04 PM

## 2017-07-11 NOTE — Assessment & Plan Note (Addendum)
Chronic. Controlled. Seems to be tolerating metformin-XR well. Patient still thinking about bariatric clinic but has yet to make a decision concerning a referral. Not making efforts to exercise. --Continue metformin-XR 1000 mg twice daily --RTC 2 months for recheck of A1c

## 2017-07-28 ENCOUNTER — Other Ambulatory Visit (INDEPENDENT_AMBULATORY_CARE_PROVIDER_SITE_OTHER): Payer: Self-pay | Admitting: Orthopaedic Surgery

## 2017-07-28 NOTE — Telephone Encounter (Signed)
Please advise 

## 2017-07-28 NOTE — Telephone Encounter (Signed)
Refill request, pls advise

## 2017-07-28 NOTE — Telephone Encounter (Signed)
yes

## 2017-07-28 NOTE — Telephone Encounter (Signed)
approve

## 2017-07-29 ENCOUNTER — Other Ambulatory Visit: Payer: Self-pay | Admitting: *Deleted

## 2017-08-01 NOTE — Telephone Encounter (Signed)
Patient calling checking status of meloxicam refill.

## 2017-08-02 MED ORDER — MELOXICAM 7.5 MG PO TABS: 7.5000 mg | ORAL_TABLET | Freq: Two times a day (BID) | ORAL | 2 refills | Status: DC

## 2017-08-30 ENCOUNTER — Ambulatory Visit: Payer: PRIVATE HEALTH INSURANCE | Admitting: Podiatry

## 2017-09-19 ENCOUNTER — Encounter: Payer: Self-pay | Admitting: Podiatry

## 2017-09-19 ENCOUNTER — Ambulatory Visit (INDEPENDENT_AMBULATORY_CARE_PROVIDER_SITE_OTHER): Payer: PRIVATE HEALTH INSURANCE | Admitting: Podiatry

## 2017-09-19 ENCOUNTER — Ambulatory Visit: Payer: PRIVATE HEALTH INSURANCE | Admitting: Podiatry

## 2017-09-19 DIAGNOSIS — M79675 Pain in left toe(s): Secondary | ICD-10-CM

## 2017-09-19 DIAGNOSIS — M79674 Pain in right toe(s): Secondary | ICD-10-CM

## 2017-09-20 NOTE — Progress Notes (Signed)
Subjective:   Patient ID: [name], female   DOB: 61 y.o.   MRN: [id number]   HPI Patient presents with elongated nailbeds 1-5 both feet with yellow subungual debris and long-term history of diabetes   ROS      Objective:  Physical Exam  Neurovascular status unchanged with thick yellow brittle nailbeds 1-5 both feet that are painful     Assessment:  Mycotic nail infection 1-5 both feet     Plan:  Debrided nailbeds 1-5 both feet with no iatrogenic bleeding noted

## 2017-09-29 ENCOUNTER — Other Ambulatory Visit: Payer: Self-pay | Admitting: Family Medicine

## 2017-10-05 ENCOUNTER — Other Ambulatory Visit: Payer: Self-pay | Admitting: *Deleted

## 2017-10-05 DIAGNOSIS — IMO0001 Reserved for inherently not codable concepts without codable children: Secondary | ICD-10-CM

## 2017-10-05 DIAGNOSIS — E1165 Type 2 diabetes mellitus with hyperglycemia: Principal | ICD-10-CM

## 2017-10-05 MED ORDER — ENALAPRIL MALEATE 20 MG PO TABS: 20.0000 mg | ORAL_TABLET | Freq: Two times a day (BID) | ORAL | 11 refills | Status: DC

## 2017-10-05 MED ORDER — METFORMIN HCL ER 500 MG PO TB24: 1000.0000 mg | ORAL_TABLET | Freq: Two times a day (BID) | ORAL | 3 refills | Status: DC

## 2017-10-11 ENCOUNTER — Other Ambulatory Visit (INDEPENDENT_AMBULATORY_CARE_PROVIDER_SITE_OTHER): Payer: Self-pay | Admitting: Orthopaedic Surgery

## 2017-10-11 NOTE — Telephone Encounter (Signed)
yes

## 2017-10-11 NOTE — Telephone Encounter (Signed)
Tried to call in Rx, line is busy. Will try again later.

## 2017-11-16 ENCOUNTER — Other Ambulatory Visit: Payer: Self-pay

## 2017-11-16 MED ORDER — MELOXICAM 7.5 MG PO TABS: 7.5000 mg | ORAL_TABLET | Freq: Two times a day (BID) | ORAL | 2 refills | Status: DC

## 2017-12-19 ENCOUNTER — Ambulatory Visit: Payer: 59 | Admitting: Podiatry

## 2017-12-19 ENCOUNTER — Ambulatory Visit: Payer: PRIVATE HEALTH INSURANCE | Admitting: Podiatry

## 2017-12-19 ENCOUNTER — Encounter: Payer: Self-pay | Admitting: Podiatry

## 2017-12-19 DIAGNOSIS — E119 Type 2 diabetes mellitus without complications: Secondary | ICD-10-CM | POA: Diagnosis not present

## 2017-12-19 DIAGNOSIS — M79674 Pain in right toe(s): Secondary | ICD-10-CM

## 2017-12-19 DIAGNOSIS — B351 Tinea unguium: Secondary | ICD-10-CM | POA: Diagnosis not present

## 2017-12-19 DIAGNOSIS — M79675 Pain in left toe(s): Secondary | ICD-10-CM | POA: Diagnosis not present

## 2017-12-19 NOTE — Progress Notes (Signed)
Subjective:   Patient ID: [name], female   DOB: 62 y.o.   MRN: [id number]   HPI Patient presents with incurvated nailbeds 1-5 both feet that are thick and painful and she cannot cut and she does have diabetes under fair control   ROS      Objective:  Physical Exam  Neurovascular status intact with thick yellow brittle nailbeds 1-5 both feet that are incurvated and painful when palpated     Assessment:  Chronic mycotic nail infection 1-5 both feet with pain with patient being a long-term diabetic with moderate at risk factors     Plan:  Sterile debridement of nailbeds 1-5 both feet with no iatrogenic bleeding noted

## 2018-01-23 NOTE — Progress Notes (Signed)
   Subjective   Patient ID: [name]    DOB: Feb 20, 1956, 62 y.o. female   MRN: [id number]  CC: " Diabetes"  HPI: [name] is a 62 y.o. female who presents to clinic today for the following:  Change of vision: Patient complaining of floater in right eye 2 days following mechanical fall and has which resulted in patient face planting onto the floor.  She denies trauma to the head or face, loss of consciousness.  She denies prior history of floaters.  She does have known cataracts and scheduled an appointment with her ophthalmologist next week.  Right eye is nonpainful.    Diabetes: Patient currently taking metformin extended release.  Last A1c 7.9 on 05/2017.  She was instructed to follow-up in 2 months but is here today 8 months later.  Patient is interested in bariatric clinic today.  She is hopeful to be more active during the summer.  She tries to make goals to avoid carbohydrate intake.  ROS: see HPI for pertinent.  [place]: Uncontrolled NIDDM, HTN, GERD, morbid obesity. Surgical history tubal ligation, D&C with myosure (age 1 for endometrial polyp). Family history heart disease, HTN. Smoking status reviewed. Medications reviewed.  Objective   BP (!) 150/88   Pulse 84   Temp 98.3 F (36.8 C) (Oral)   Ht 5\' 3"  (1.6 m)   Wt 256 lb 9.6 oz (116.4 kg)   SpO2 97%   BMI 45.45 kg/m  Vitals and nursing note reviewed.  General: obese, well developed, NAD with non-toxic appearance HEENT: normocephalic, atraumatic, moist mucous membranes, PERRLA, EOMI, unable to visualize papilledema given significant cataracts Neck: supple, non-tender without lymphadenopathy Cardiovascular: regular rate and rhythm without murmurs, rubs, or gallops Lungs: clear to auscultation bilaterally with normal work of breathing Abdomen: soft, non-tender, non-distended, normoactive bowel sounds Skin: warm, dry, no rashes or lesions, cap refill < 2 seconds Extremities: warm and well perfused,  normal tone, no edema Neuro: CNII-XII intact, no dysarthria, fine motor intact, normal gait  Assessment & Plan   Uncontrolled diabetes mellitus (HCC) Chronic.  Suboptimal control back in August.  Without insulin use.  Morbidly obese with known HTN and HLD. - Checking A1c - Continue metformin extended release 1000 mg twice daily  Abnormal peripheral vision of right eye Acute.  Uncertain if this is related to mechanical fall.  Visual acuity 20/25 left eye and 20/50 right eye.  Has known cataracts.  Has scheduled appoint with ophthalmologist in 1 week.  No signs of neurological deficit. - Instructed patient to follow-up with appointment with ophthalmologist  - Reviewed return precautions  Orders Placed This Encounter  Procedures  . Amb Referral to Bariatric Surgery    Referral Priority:   Routine    Referral Type:   Consultation    Number of Visits Requested:   1  . HgB A1c   No orders of the defined types were placed in this encounter.   Harriet Butte, [place], PGY-2 01/24/2018, 8:27 PM

## 2018-01-24 ENCOUNTER — Encounter: Payer: Self-pay | Admitting: Family Medicine

## 2018-01-24 ENCOUNTER — Ambulatory Visit (INDEPENDENT_AMBULATORY_CARE_PROVIDER_SITE_OTHER): Payer: 59 | Admitting: Family Medicine

## 2018-01-24 ENCOUNTER — Other Ambulatory Visit: Payer: Self-pay

## 2018-01-24 VITALS — BP 150/88 | HR 84 | Temp 98.3°F | Ht 63.0 in | Wt 256.6 lb

## 2018-01-24 DIAGNOSIS — I10 Essential (primary) hypertension: Secondary | ICD-10-CM

## 2018-01-24 DIAGNOSIS — Z6841 Body Mass Index (BMI) 40.0 and over, adult: Secondary | ICD-10-CM

## 2018-01-24 DIAGNOSIS — E1165 Type 2 diabetes mellitus with hyperglycemia: Secondary | ICD-10-CM | POA: Diagnosis not present

## 2018-01-24 DIAGNOSIS — E1169 Type 2 diabetes mellitus with other specified complication: Secondary | ICD-10-CM | POA: Diagnosis not present

## 2018-01-24 DIAGNOSIS — H53451 Other localized visual field defect, right eye: Secondary | ICD-10-CM | POA: Diagnosis not present

## 2018-01-24 DIAGNOSIS — E782 Mixed hyperlipidemia: Secondary | ICD-10-CM

## 2018-01-24 LAB — POCT GLYCOSYLATED HEMOGLOBIN (HGB A1C): HEMOGLOBIN A1C: 6.7

## 2018-01-24 NOTE — Assessment & Plan Note (Deleted)
A 

## 2018-01-24 NOTE — Assessment & Plan Note (Signed)
Acute.  Uncertain if this is related to mechanical fall.  Visual acuity 20/25 left eye and 20/50 right eye.  Has known cataracts.  Has scheduled appoint with ophthalmologist in 1 week.  No signs of neurological deficit. - Instructed patient to follow-up with appointment with ophthalmologist  - Reviewed return precautions

## 2018-01-24 NOTE — Assessment & Plan Note (Addendum)
Chronic.  Suboptimal control back in August.  Without insulin use.  Morbidly obese with known HTN and HLD. - Checking A1c - Continue metformin extended release 1000 mg twice daily

## 2018-01-24 NOTE — Patient Instructions (Addendum)
Thank you for coming in to see [place] today. Please see below to review our plan for today's visit.  1.  Follow-up with your ophthalmologist next week regarding your eyes. 2.  Your diabetes is well controlled on the metformin.  Continue exercising regularly and avoid excessive carbohydrate intake. 3.  We will recheck her blood pressure you next visit.  Be sure to take your medications prior to arrival. 4.  Your homework is to contact [facility] for a screening colonoscopy and [facility] for a screening mamm[facility]. 5.  I have placed a bariatric referral for you.  Expect to receive a call within the week.  Please call the clinic at [phone] if your symptoms worsen or you have any concerns. It was our pleasure to serve you.  Harriet Butte, [place], PGY-2

## 2018-01-31 ENCOUNTER — Other Ambulatory Visit: Payer: Self-pay

## 2018-01-31 NOTE — Telephone Encounter (Signed)
Pt called nurse line, states rx for voltaren gel and tramadol were not refilled at her last appt. [facility] [place] Dr. Abbott Pao call back [phone] Wallace Cullens, RN

## 2018-02-02 NOTE — Telephone Encounter (Signed)
These should be refilled by her orthopedic surgeon Dr. Erlinda Hong.  [name], [place], PGY-2

## 2018-02-08 NOTE — Telephone Encounter (Signed)
Called patient to inform her that she needs to go through her orthopedic surgeon for refill of her Tramadol and Voltaren gel.  Patient informed me that she will call her orthopedic for the tramadol but she was prescribed the voltaren gel here at [facility] by Dr. Alease Frame and she wants it refilled because she is in a lot of pain and really needs it.  She says that if there are any questions she can be reached at [phone].  Marland KitchenOzella Almond, CMA

## 2018-02-09 ENCOUNTER — Other Ambulatory Visit: Payer: Self-pay | Admitting: Family Medicine

## 2018-02-09 DIAGNOSIS — M1712 Unilateral primary osteoarthritis, left knee: Secondary | ICD-10-CM

## 2018-02-09 MED ORDER — DICLOFENAC SODIUM 1 % TD GEL: 2.0000 g | Freq: Four times a day (QID) | TRANSDERMAL | 1 refills | Status: AC

## 2018-02-09 NOTE — Telephone Encounter (Signed)
Voltaren gel sent to pharmacy. Thanks.  Harriet Butte, [place], PGY-2

## 2018-02-11 ENCOUNTER — Other Ambulatory Visit: Payer: Self-pay | Admitting: Family Medicine

## 2018-02-11 DIAGNOSIS — IMO0001 Reserved for inherently not codable concepts without codable children: Secondary | ICD-10-CM

## 2018-02-11 DIAGNOSIS — E1165 Type 2 diabetes mellitus with hyperglycemia: Principal | ICD-10-CM

## 2018-02-20 ENCOUNTER — Other Ambulatory Visit: Payer: Self-pay | Admitting: Family Medicine

## 2018-02-20 DIAGNOSIS — E669 Obesity, unspecified: Secondary | ICD-10-CM

## 2018-02-20 DIAGNOSIS — K219 Gastro-esophageal reflux disease without esophagitis: Secondary | ICD-10-CM

## 2018-02-27 ENCOUNTER — Other Ambulatory Visit: Payer: Self-pay | Admitting: Family Medicine

## 2018-02-27 DIAGNOSIS — K219 Gastro-esophageal reflux disease without esophagitis: Secondary | ICD-10-CM

## 2018-02-27 DIAGNOSIS — E669 Obesity, unspecified: Secondary | ICD-10-CM

## 2018-03-02 ENCOUNTER — Other Ambulatory Visit: Payer: Self-pay

## 2018-03-02 DIAGNOSIS — E669 Obesity, unspecified: Secondary | ICD-10-CM

## 2018-03-02 DIAGNOSIS — K219 Gastro-esophageal reflux disease without esophagitis: Secondary | ICD-10-CM

## 2018-03-02 MED ORDER — METOPROLOL TARTRATE 50 MG PO TABS: 50.0000 mg | ORAL_TABLET | Freq: Two times a day (BID) | ORAL | 11 refills | Status: DC

## 2018-03-02 MED ORDER — OMEPRAZOLE 20 MG PO CPDR: 20.0000 mg | DELAYED_RELEASE_CAPSULE | Freq: Every day | ORAL | 11 refills | Status: DC

## 2018-03-02 NOTE — Telephone Encounter (Addendum)
I also received this message and called patient back and explained what happened. Also told patient I will call [facility] to make sure they understand that patient is no longer under Dr Alease Frame care. Spoke with Daron at [employer] who stated as long as she puts in the number off the new bottle she gets today that it should not be an issue. He did make a note on her profile that her med refills go to Dr Yisroel Ramming.  Asked patient to also clarify this when picks up her medications that have now been refilled. Patient appreciative.  Danley Danker, RN [facility] RN)

## 2018-03-02 NOTE — Telephone Encounter (Signed)
Pt left message on nurse line upset that her rx were denied saying she was no longer a patient. Pharmacy sent rx to Dr. Alease Frame and not Dr. Yisroel Ramming. Attempted to call patient and let her know what happened, no answer no voicemail. Pt call back [phone] Wallace Cullens, RN

## 2018-03-07 ENCOUNTER — Other Ambulatory Visit: Payer: Self-pay

## 2018-03-07 MED ORDER — CHLORTHALIDONE 50 MG PO TABS: 50.0000 mg | ORAL_TABLET | Freq: Every day | ORAL | 3 refills | Status: DC

## 2018-03-22 ENCOUNTER — Ambulatory Visit: Payer: 59 | Admitting: Podiatry

## 2018-04-21 ENCOUNTER — Telehealth: Payer: Self-pay | Admitting: Family Medicine

## 2018-04-21 NOTE — Telephone Encounter (Signed)
Called pt to set up f/u appt. Pt didn't want to set up right now. Said she will call back. Please assist in setting this up.

## 2018-04-24 ENCOUNTER — Encounter (HOSPITAL_COMMUNITY): Payer: Self-pay

## 2018-04-24 ENCOUNTER — Ambulatory Visit (HOSPITAL_COMMUNITY)
Admission: EM | Admit: 2018-04-24 | Discharge: 2018-04-24 | Disposition: A | Discharge status: Home / Self Care | Payer: 59 | Attending: Family Medicine | Admitting: Family Medicine

## 2018-04-24 DIAGNOSIS — G44309 Post-traumatic headache, unspecified, not intractable: Secondary | ICD-10-CM

## 2018-04-24 DIAGNOSIS — S0990XA Unspecified injury of head, initial encounter: Secondary | ICD-10-CM

## 2018-04-24 DIAGNOSIS — W19XXXA Unspecified fall, initial encounter: Secondary | ICD-10-CM

## 2018-04-24 NOTE — Discharge Instructions (Signed)
Use anti-inflammatories for pain/swelling. You may take up to 800 mg Ibuprofen every 8 hours with food. You may supplement Ibuprofen with Tylenol [phone] mg every 8 hours.   Return or go to ED if developing worsening headache, changes in vision, one sided weakness, difficulty speaking, dizziness or lightheadedness

## 2018-04-24 NOTE — ED Provider Notes (Signed)
[facility]    CSN: [id number] Arrival date & time: 04/24/18  1259     History   Chief Complaint Chief Complaint  Patient presents with  . Fall Injury ; Head    HPI [name] is a 62 y.o. female history of DM type II, GERD, hypertension presenting today for evaluation of a patient states that she slipped in a rolling chair while at work earlier today, she fell and hit the back of her head on a cement floor.  Since she has had pain to the back of her head.  Denies loss of consciousness at time of fall.  Patient did require assistance getting back up.  She denies any loss of bowel or bladder control.  Denies any changes in vision, one-sided weakness or difficulty speaking.  Denies any lightheadedness or dizziness.  Daughter with her denies any noticeable changes from baseline.  She has not taken any medicines for her symptoms.  HPI  Past Medical History:  Diagnosis Date  . Diabetes mellitus without complication ([place])   . GERD (gastroesophageal reflux disease)   . Hypertension     Patient Active Problem List   Diagnosis Date Noted  . Abnormal peripheral vision of right eye 01/24/2018  . Allergic rhinitis 07/11/2017  . Unilateral primary osteoarthritis, left knee 12/20/2016  . Heart murmur on physical examination 12/09/2014  . Sciatica of right side 02/06/2014  . Uncontrolled diabetes mellitus ([place]) 03/08/2008  . Lipoma 03/31/2007  . Mixed hyperlipidemia due to type 2 diabetes mellitus ([place]) 12/01/2006  . Morbid obesity with BMI of 45.0-49.9, adult ([place]) 12/01/2006  . Primary hypertension 12/01/2006  . GERD (gastroesophageal reflux disease) 12/01/2006    Past Surgical History:  Procedure Laterality Date  . DILATATION & CURETTAGE/HYSTEROSCOPY WITH MYOSURE N/A 08/26/2015   Procedure: DILATATION & CURETTAGE/HYSTEROSCOPY WITH MYOSURE;  Surgeon: [name], MD;  Location: [facility];  Service: Gynecology;  Laterality: N/A;  . TUBAL LIGATION      OB  History    Gravida  4   Para  2   Term      Preterm      AB  1   Living  2     SAB  1   TAB      Ectopic      Multiple      Live Births               Home Medications    Prior to Admission medications   Medication Sig Start Date End Date Taking? Authorizing Provider  acetaminophen (TYLENOL) 500 MG tablet Take 1,000 mg by mouth every 6 (six) hours as needed for mild pain or headache.    [provider]  atorvastatin (LIPITOR) 40 MG tablet Take 1 tablet (40 mg total) by mouth daily. Patient not taking: Reported on 01/24/2018 05/31/17   [place] Bing, DO  chlorthalidone (HYGROTON) 50 MG tablet Take 1 tablet (50 mg total) by mouth daily. 03/07/18   [place] Bing, DO  co-enzyme Q-10 30 MG capsule Take 30 mg by mouth 3 (three) times daily.    [provider]  diclofenac sodium (VOLTAREN) 1 % GEL Apply 2 g topically 4 (four) times daily. 02/09/18   [place] Bing, DO  enalapril (VASOTEC) 20 MG tablet Take 1 tablet (20 mg total) by mouth 2 (two) times daily. 10/05/17    Bing, DO  fluticasone (FLONASE) 50 MCG/ACT nasal spray Place 1 spray into both nostrils daily. 07/11/17  [place] Bing, DO  meloxicam (MOBIC) 7.5 MG tablet TAKE 1 TABLET BY MOUTH TWICE DAILY 02/20/18   [place] Bing, DO  metFORMIN (GLUCOPHAGE-XR) 500 MG 24 hr tablet TAKE 2 TABLETS BY MOUTH TWICE DAILY WITH A MEAL 02/13/18   [place] Bing, DO  metoprolol tartrate (LOPRESSOR) 50 MG tablet Take 1 tablet (50 mg total) by mouth 2 (two) times daily. 03/02/18   [place] Bing, DO  mupirocin ointment (BACTROBAN) 2 % Apply 1 application topically 2 (two) times daily. 03/04/17   Trula Slade, DPM  omeprazole (PRILOSEC) 20 MG capsule Take 1 capsule (20 mg total) by mouth daily. 03/02/18   [place] Bing, DO  traMADol (ULTRAM) 50 MG tablet TAKE 1 TABLET BY MOUTH EVERY 6 HOURS AS NEEDED Patient not taking: Reported on 01/24/2018 10/12/17   Leandrew Koyanagi, MD  traZODone  (DESYREL) 50 MG tablet Take 1 tablet (50 mg total) by mouth at bedtime as needed for sleep. 1-2 tabs by mouth at bedtime as needed for sleep 1-2 tabs by mouth at bedtime as needed for sleep Patient not taking: Reported on 01/24/2018 01/30/16   McKeag, Marylynn Pearson, MD    Family History Family History  Problem Relation Age of Onset  . Heart disease Mother   . Breast cancer Paternal Grandmother   . Breast cancer Paternal Aunt   . Hypertension Father   . Hypertension Sister   . Hypertension Brother     Social History Social History   Tobacco Use  . Smoking status: Former Research scientist (life sciences)  . Smokeless tobacco: Never Used  Substance Use Topics  . Alcohol use: Yes    Alcohol/week: 0.0 oz  . Drug use: No     Allergies   Amoxicillin; Lovastatin; and Penicillins   Review of Systems Review of Systems  Constitutional: Negative for fatigue and fever.  HENT: Negative for congestion, sinus pressure and sore throat.   Eyes: Negative for photophobia, pain and visual disturbance.  Respiratory: Negative for cough and shortness of breath.   Cardiovascular: Negative for chest pain.  Gastrointestinal: Negative for abdominal pain, nausea and vomiting.  Genitourinary: Negative for decreased urine volume and hematuria.  Musculoskeletal: Negative for myalgias, neck pain and neck stiffness.  Neurological: Positive for headaches. Negative for dizziness, syncope, facial asymmetry, speech difficulty, weakness, light-headedness and numbness.     Physical Exam Triage Vital Signs ED Triage Vitals [04/24/18 1343]  Enc Vitals Group     BP (!) 148/62     Pulse Rate 75     Resp 20     Temp 98.1 F (36.7 C)     Temp Source Oral     SpO2 98 %     Weight      Height      Head Circumference      Peak Flow      Pain Score      Pain Loc      Pain Edu?      Excl. in [place]?    No data found.  Updated Vital Signs BP (!) 148/62 (BP Location: Left Arm)   Pulse 75   Temp 98.1 F (36.7 C) (Oral)   Resp 20   SpO2  98%   Visual Acuity Right Eye Distance:   Left Eye Distance:   Bilateral Distance:    Right Eye Near:   Left Eye Near:    Bilateral Near:     Physical Exam  Constitutional: She is oriented to person, place, and time.  She appears well-developed and well-nourished. No distress.  HENT:  Head: Normocephalic and atraumatic.  Mouth/Throat: Oropharynx is clear and moist.  Bilateral ears without tenderness to palpation of external auricle, tragus and mastoid, EAC's without erythema or swelling, TM's with good bony landmarks and cone of light. Non erythematous.  Oral mucosa pink and moist, no tonsillar enlargement or exudate. Posterior pharynx patent and nonerythematous, no uvula deviation or swelling. Normal phonation.  Eyes: Pupils are equal, round, and reactive to light. Conjunctivae and EOM are normal.  Wearing glasses  Neck: Neck supple.  Cardiovascular: Normal rate and regular rhythm.  No murmur heard. Pulmonary/Chest: Effort normal and breath sounds normal. No respiratory distress.  Abdominal: Soft. There is no tenderness.  Musculoskeletal: She exhibits no edema.  Neurological: She is alert and oriented to person, place, and time.  Patient A&O x3, cranial nerves II-XII grossly intact, strength at shoulders, hips and knees 5/5, equal bilaterally, patellar reflexes difficult to obtain bilaterally. Normal Finger to nose, RAM and heel to shin. Negative Romberg and Pronator Drift. Gait without abnormality.  Skin: Skin is warm and dry.  Psychiatric: She has a normal mood and affect.  Nursing note and vitals reviewed.    UC Treatments / Results  Labs (all labs ordered are listed, but only abnormal results are displayed) Labs Reviewed - No data to display  EKG None  Radiology No results found.  Procedures Procedures (including critical care time)  Medications Ordered in UC Medications - No data to display  Initial Impression / Assessment and Plan / UC Course  I have  reviewed the triage vital signs and the nursing notes.  Pertinent labs & imaging results that were available during my care of the patient were reviewed by me and considered in my medical decision making (see chart for details).     Patient hit head secondary to fall, not on any blood thinners, no focal neuro deficits, vital signs stable.  At this time likely contusion to head, does not appear to have concussive symptoms.  Will recommend anti-inflammatories for headache, monitor symptoms for intracranial bleed over the next 48 to 72 hours.Discussed strict return precautions. Patient verbalized understanding and is agreeable with plan.  Final Clinical Impressions(s) / UC Diagnoses   Final diagnoses:  Fall, initial encounter  Injury of head, initial encounter     Discharge Instructions     Use anti-inflammatories for pain/swelling. You may take up to 800 mg Ibuprofen every 8 hours with food. You may supplement Ibuprofen with Tylenol [phone] mg every 8 hours.   Return or go to ED if developing worsening headache, changes in vision, one sided weakness, difficulty speaking, dizziness or lightheadedness   ED Prescriptions    None     Controlled Substance Prescriptions [place] Controlled Substance Registry consulted? Not Applicable   Janith Lima, [place] 04/24/18 1443

## 2018-04-24 NOTE — ED Triage Notes (Signed)
Pt presents with head injury from fall

## 2018-05-22 ENCOUNTER — Ambulatory Visit: Payer: 59 | Admitting: Podiatry

## 2018-06-02 ENCOUNTER — Ambulatory Visit: Payer: 59 | Admitting: Podiatry

## 2018-06-02 ENCOUNTER — Encounter: Payer: Self-pay | Admitting: Podiatry

## 2018-06-02 DIAGNOSIS — M79674 Pain in right toe(s): Secondary | ICD-10-CM

## 2018-06-02 DIAGNOSIS — M79675 Pain in left toe(s): Secondary | ICD-10-CM

## 2018-06-02 DIAGNOSIS — B351 Tinea unguium: Secondary | ICD-10-CM | POA: Diagnosis not present

## 2018-06-02 NOTE — Progress Notes (Signed)
Subjective:   Patient ID: [name], female   DOB: 62 y.o.   MRN: [id number]   HPI Patient presents with elongated nailbeds 1-5 both feet that are incurvated in the corner she cannot cut and they get sore with long-term diabetes and patient   ROS      Objective:  Physical Exam  Mycotic nail infection with pain 1-5 both feet with thick yellow brittle nailbeds     Assessment:  Chronic ingrown toenail deformity with thickness and brittle debris 1-5 both feet     Plan:  Debrided nailbeds 1-5 both feet with no iatrogenic bleeding noted

## 2018-06-03 ENCOUNTER — Other Ambulatory Visit: Payer: Self-pay | Admitting: Family Medicine

## 2018-07-04 ENCOUNTER — Other Ambulatory Visit: Payer: Self-pay | Admitting: Family Medicine

## 2018-07-04 DIAGNOSIS — IMO0001 Reserved for inherently not codable concepts without codable children: Secondary | ICD-10-CM

## 2018-07-04 DIAGNOSIS — E1165 Type 2 diabetes mellitus with hyperglycemia: Principal | ICD-10-CM

## 2018-09-05 ENCOUNTER — Ambulatory Visit (INDEPENDENT_AMBULATORY_CARE_PROVIDER_SITE_OTHER): Payer: 59 | Admitting: Family Medicine

## 2018-09-05 VITALS — BP 102/80 | HR 80 | Temp 98.2°F | Wt 259.6 lb

## 2018-09-05 DIAGNOSIS — E119 Type 2 diabetes mellitus without complications: Secondary | ICD-10-CM | POA: Diagnosis not present

## 2018-09-05 DIAGNOSIS — IMO0002 Reserved for concepts with insufficient information to code with codable children: Secondary | ICD-10-CM

## 2018-09-05 DIAGNOSIS — E0840 Diabetes mellitus due to underlying condition with diabetic neuropathy, unspecified: Secondary | ICD-10-CM | POA: Diagnosis not present

## 2018-09-05 DIAGNOSIS — Z23 Encounter for immunization: Secondary | ICD-10-CM

## 2018-09-05 DIAGNOSIS — E0865 Diabetes mellitus due to underlying condition with hyperglycemia: Secondary | ICD-10-CM | POA: Diagnosis not present

## 2018-09-05 DIAGNOSIS — Z6841 Body Mass Index (BMI) 40.0 and over, adult: Secondary | ICD-10-CM

## 2018-09-05 DIAGNOSIS — Z7689 Persons encountering health services in other specified circumstances: Secondary | ICD-10-CM | POA: Diagnosis not present

## 2018-09-05 DIAGNOSIS — H539 Unspecified visual disturbance: Secondary | ICD-10-CM | POA: Diagnosis not present

## 2018-09-05 LAB — POCT GLYCOSYLATED HEMOGLOBIN (HGB A1C): HBA1C, POC (CONTROLLED DIABETIC RANGE): 7.1 % — AB (ref 0.0–7.0)

## 2018-09-05 NOTE — Progress Notes (Addendum)
Established Patient Office Visit  Subjective:  Patient ID: [name], female    DOB: August 04, 1956  Age: 62 y.o. MRN: [id number]  CC:  Chief Complaint  Patient presents with  . Diabetes   HPI [name] is a 62yo F who presents to clinic today for diabetes f/u  Change of vision: Patient reported floaters and spots in R eyesight during last visit 01/2018. At that time, she had an appt with ophthalmology scheduled. Today she reports she did not make this appt and has had continued floaters/spots in R eye. She denies any LOC or trauma to her head during mechanical fall that resulted in symptoms or since incident. Reports no eye pain or change in vision.  Diabetes: Patient continuing on Metformin ER. She reports no GI upset besides occasionally seeing the pill in her stool. She hopes to stay on just 1 diabetes medication. She reports feeling occasional numbness in her fingers/toes but states this happens only 1x/several weeks. She reports no foot injuries, nausea/vomiting, diarrhea/constipation, fatigue. W/r/t her diet since last visit, she feels she has not avoided carbohydrates as discussed previously. She is currently eating only 1 meal a day which is usually fast food. She feels it is difficult to make dinner for just one person as she ends up throwing out most of what she makes. She drinks Glucerna shake 2-3x/week.   Past Medical History:  Diagnosis Date  . Diabetes mellitus without complication ([place])   . GERD (gastroesophageal reflux disease)   . Hypertension     Past Surgical History:  Procedure Laterality Date  . DILATATION & CURETTAGE/HYSTEROSCOPY WITH MYOSURE N/A 08/26/2015   Procedure: DILATATION & CURETTAGE/HYSTEROSCOPY WITH MYOSURE;  Surgeon: [name], MD;  Location: [facility];  Service: Gynecology;  Laterality: N/A;  . TUBAL LIGATION      Family History  Problem Relation Age of Onset  . Heart disease Mother   . Breast cancer Paternal Grandmother    . Breast cancer Paternal Aunt   . Hypertension Father   . Hypertension Sister   . Hypertension Brother     Social History   Socioeconomic History  . Marital status: Single    Spouse name: Not on file  . Number of children: Not on file  . Years of education: Not on file  . Highest education level: Not on file  Occupational History  . Not on file  Social Needs  . Financial resource strain: Not on file  . Food insecurity:    Worry: Not on file    Inability: Not on file  . Transportation needs:    Medical: Not on file    Non-medical: Not on file  Tobacco Use  . Smoking status: Former Research scientist (life sciences)  . Smokeless tobacco: Never Used  Substance and Sexual Activity  . Alcohol use: Yes    Alcohol/week: 0.0 standard drinks  . Drug use: No  . Sexual activity: Never  Lifestyle  . Physical activity:    Days per week: Not on file    Minutes per session: Not on file  . Stress: Not on file  Relationships  . Social connections:    Talks on phone: Not on file    Gets together: Not on file    Attends religious service: Not on file    Active member of club or organization: Not on file    Attends meetings of clubs or organizations: Not on file    Relationship status: Not on file  . Intimate partner violence:  Fear of current or ex partner: Not on file    Emotionally abused: Not on file    Physically abused: Not on file    Forced sexual activity: Not on file  Other Topics Concern  . Not on file  Social History Narrative  . Not on file    Outpatient Medications Prior to Visit  Medication Sig Dispense Refill  . acetaminophen (TYLENOL) 500 MG tablet Take 1,000 mg by mouth every 6 (six) hours as needed for mild pain or headache.    Marland Kitchen atorvastatin (LIPITOR) 40 MG tablet Take 1 tablet (40 mg total) by mouth daily. 90 tablet 3  . chlorthalidone (HYGROTON) 50 MG tablet Take 1 tablet (50 mg total) by mouth daily. 90 tablet 3  . co-enzyme Q-10 30 MG capsule Take 30 mg by mouth 3 (three)  times daily.    . diclofenac sodium (VOLTAREN) 1 % GEL Apply 2 g topically 4 (four) times daily. 100 g 1  . enalapril (VASOTEC) 20 MG tablet Take 1 tablet (20 mg total) by mouth 2 (two) times daily. 60 tablet 11  . fluticasone (FLONASE) 50 MCG/ACT nasal spray Place 1 spray into both nostrils daily. 16 g 5  . meloxicam (MOBIC) 7.5 MG tablet TAKE 1 TABLET BY MOUTH TWICE DAILY 60 tablet 2  . metFORMIN (GLUCOPHAGE-XR) 500 MG 24 hr tablet TAKE 2 TABLETS BY MOUTH TWICE DAILY WITH A MEAL 120 tablet 3  . metoprolol tartrate (LOPRESSOR) 50 MG tablet Take 1 tablet (50 mg total) by mouth 2 (two) times daily. 60 tablet 11  . mupirocin ointment (BACTROBAN) 2 % Apply 1 application topically 2 (two) times daily. 30 g 2  . omeprazole (PRILOSEC) 20 MG capsule Take 1 capsule (20 mg total) by mouth daily. 30 capsule 11  . traMADol (ULTRAM) 50 MG tablet TAKE 1 TABLET BY MOUTH EVERY 6 HOURS AS NEEDED 30 tablet 0  . traZODone (DESYREL) 50 MG tablet Take 1 tablet (50 mg total) by mouth at bedtime as needed for sleep. 1-2 tabs by mouth at bedtime as needed for sleep 1-2 tabs by mouth at bedtime as needed for sleep 30 tablet 2   No facility-administered medications prior to visit.     Allergies  Allergen Reactions  . Amoxicillin Hives  . Lovastatin     REACTION: leg cramps  . Penicillins Hives    Has patient had a PCN reaction causing immediate rash, facial/tongue/throat swelling, SOB or lightheadedness with hypotension: No Has patient had a PCN reaction causing severe rash involving mucus membranes or skin necrosis: No Has patient had a PCN reaction that required hospitalization No Has patient had a PCN reaction occurring within the last 10 years: No If all of the above answers are "NO", then may proceed with Cephalosporin use.     ROS Review of Systems  Constitutional: Negative for chills, fatigue and fever.  Respiratory: Negative for cough.   Endocrine: Negative for polyuria.  Genitourinary: Negative for  difficulty urinating and dysuria.  Neurological: Negative for weakness and headaches.      Objective:     Physical Exam  Constitutional: She is oriented to person, place, and time.  Cardiovascular: Normal rate, regular rhythm and normal heart sounds.  Pulmonary/Chest: Effort normal and breath sounds normal. She has no wheezes.  Abdominal: Soft. Bowel sounds are normal. She exhibits no distension. There is no tenderness.  Musculoskeletal: Normal range of motion.  1+ nonpitting edema BL around ankles up to mid-calf  Neurological: She is alert and  oriented to person, place, and time.  Passed Diabetic Foot Exam: motor function and sensation intact BL with strong pedal pulses  BP 102/80   Pulse 80   Temp 98.2 F (36.8 C) (Oral)   Wt 259 lb 9.6 oz (117.8 kg)   SpO2 97%   BMI 45.99 kg/m  Wt Readings from Last 3 Encounters:  09/05/18 259 lb 9.6 oz (117.8 kg)  01/24/18 256 lb 9.6 oz (116.4 kg)  07/11/17 255 lb (115.7 kg)      Assessment & Plan:   Uncontrolled T2DM Today's A1c increased from 6.7 to 7.1.  -Collect yearly labs today including TSH, CMP, CBC, Lipid panel -Continue taking Metformin Extended Release.  -Agreed to follow through on attainable goals before 55-month f/u including decreasing amount of coffee creamer, decreasing desserts/week, limiting soda consumption.   Morbid Obesity Patient seems receptive to f/u on bariatric services. She states last time she lost their f/u information after calling once and she would like to get in touch again.  -Place repeat referral to Bariatric Services.  Vision Changes Patient seems more receptive to f/u with ophthalmology. She states she will make an appointment online tonight/tomorrow.  Follow-up: Return in about 4 weeks (around 10/03/2018) for Health maintenance.    Boykin Peek, Medical Student   I have personally seen and examined this patient with [name] and agree with the above note. The following is my additional  documentation.   Physical Exam: General: well nourished, well developed, NAD with non-toxic appearance HEENT: normocephalic, atraumatic, moist mucous membranes Neck: supple, non-tender without lymphadenopathy Cardiovascular: regular rate and rhythm without murmurs, rubs, or gallops Lungs: clear to auscultation bilaterally with normal work of breathing Abdomen: obese, soft, non-tender, non-distended, normoactive bowel sounds Skin: warm, dry, no rashes or lesions, cap refill < 2 seconds Extremities: warm and well perfused, normal tone, 1+ edema up to mid shin bilaterally, sensation and motor strength intact on lower extremities bilaterally  Assessment/Plan [name] is a 62 y.o. poorly controlled non-insulin dependent diabetic presenting for routine diabetes follow-up.  She endorses poor adherence to recommendations including lifestyle modifications with diet and exercise.  She continues to take her metformin extended release 500 mg twice daily.  A1c goal less than 7.0, however she is slightly above her goal.  Her obesity remains to be her primary issue.  Unfortunately she was unable to follow-up with bariatric services due to insurance issues.  We discussed realistic goals regarding lifestyle changes and I placed a new referral for bariatric services.  We will check routine diabetic labs today.  Plan for follow-up in 1 month for additional health maintenance and discussion of diabetic lifestyle changes.  Harriet Butte, [place], PGY-3

## 2018-09-05 NOTE — Progress Notes (Deleted)
   Subjective   Patient ID: [name]    DOB: March 27, 1956, 62 y.o. female   MRN: [id number]  CC: "***"  HPI: [name] is a 62 y.o. female who presents to clinic today for the following:  Diabetes: ***  Neuropathy: ***  ROS: see HPI for pertinent.  [place]: Uncontrolled NIDDM, HTN, GERD, morbid obesity. Surgical history tubal ligation, D&C with myosure (age 72 for endometrial polyp). Family history heart disease, HTN. Smoking status reviewed. Medications reviewed.  Objective   There were no vitals taken for this visit. Vitals and nursing note reviewed.  General: well nourished, well developed, NAD with non-toxic appearance HEENT: normocephalic, atraumatic, moist mucous membranes Neck: supple, non-tender without lymphadenopathy Cardiovascular: regular rate and rhythm without murmurs, rubs, or gallops Lungs: clear to auscultation bilaterally with normal work of breathing Abdomen: soft, non-tender, non-distended, normoactive bowel sounds Skin: warm, dry, no rashes or lesions, cap refill < 2 seconds Extremities: warm and well perfused, normal tone, no edema  Assessment & Plan   No problem-specific Assessment & Plan notes found for this encounter.  No orders of the defined types were placed in this encounter.  No orders of the defined types were placed in this encounter.   Harriet Butte, [place], PGY-3 09/05/2018, 12:21 PM

## 2018-09-05 NOTE — Patient Instructions (Signed)
Thank you for coming in to see [place] today. Please see below to review our plan for today's visit.  1.  You received your flu shot. 2.  I will call you if there is anything abnormal about your blood results, otherwise you should receive results in the mail. 3.  I placed a new referral for bariatric services.  You should expect a call in the next few weeks.  Our goal today for your diet changes:  - Decreased by half the amount of coffee creamer or switch completely to sugar-free coffee creamer - Limit your soda consumption to no more than 8 ounces every 6 weeks - You may eat 1 dessert once per week but you should not snack on any sugary foods in the interim  Please call the clinic at [phone] if your symptoms worsen or you have any concerns. It was our pleasure to serve you.  Harriet Butte, [place], PGY-3

## 2018-09-06 ENCOUNTER — Encounter: Payer: Self-pay | Admitting: Family Medicine

## 2018-09-06 ENCOUNTER — Other Ambulatory Visit: Payer: Self-pay | Admitting: Family Medicine

## 2018-09-06 LAB — CMP14+EGFR
ALK PHOS: 78 IU/L (ref 39–117)
ALT: 10 IU/L (ref 0–32)
AST: 13 IU/L (ref 0–40)
Albumin/Globulin Ratio: 1.4 (ref 1.2–2.2)
Albumin: 4 g/dL (ref 3.6–4.8)
BUN/Creatinine Ratio: 19 (ref 12–28)
BUN: 17 mg/dL (ref 8–27)
CHLORIDE: 104 mmol/L (ref 96–106)
CO2: 24 mmol/L (ref 20–29)
Calcium: 9.3 mg/dL (ref 8.7–10.3)
Creatinine, Ser: 0.88 mg/dL (ref 0.57–1.00)
GFR calc Af Amer: 81 mL/min/{1.73_m2} (ref >59)
GFR calc non Af Amer: 70 mL/min/{1.73_m2} (ref >59)
GLUCOSE: 123 mg/dL — AB (ref 65–99)
Globulin, Total: 2.8 g/dL (ref 1.5–4.5)
POTASSIUM: 4 mmol/L (ref 3.5–5.2)
Sodium: 146 mmol/L — ABNORMAL HIGH (ref 134–144)
Total Protein: 6.8 g/dL (ref 6.0–8.5)

## 2018-09-06 LAB — CBC
Hematocrit: 34.7 % (ref 34.0–46.6)
Hemoglobin: 11.2 g/dL (ref 11.1–15.9)
MCH: 26.4 pg — AB (ref 26.6–33.0)
MCHC: 32.3 g/dL (ref 31.5–35.7)
MCV: 82 fL (ref 79–97)
PLATELETS: 399 10*3/uL (ref 150–450)
RBC: 4.24 x10E6/uL (ref 3.77–5.28)
RDW: 14.1 % (ref 12.3–15.4)
WBC: 11 10*3/uL — ABNORMAL HIGH (ref 3.4–10.8)

## 2018-09-06 LAB — TSH: TSH: 2.31 u[IU]/mL (ref 0.450–4.500)

## 2018-09-06 LAB — LIPID PANEL
CHOL/HDL RATIO: 3.8 ratio (ref 0.0–4.4)
Cholesterol, Total: 274 mg/dL — ABNORMAL HIGH (ref 100–199)
HDL: 72 mg/dL (ref >39)
LDL Calculated: 151 mg/dL — ABNORMAL HIGH (ref 0–99)
Triglycerides: 255 mg/dL — ABNORMAL HIGH (ref 0–149)
VLDL Cholesterol Cal: 51 mg/dL — ABNORMAL HIGH (ref 5–40)

## 2018-09-19 ENCOUNTER — Telehealth: Payer: Self-pay

## 2018-09-19 NOTE — Telephone Encounter (Signed)
Pt called nurse line stating she wanted her pcp to call her. Pt stated she received a letter in the mail in regards to the bariatric clinic. Pt stated she felt the letter was more about having the bariatric surgery and had no information on other services they may offer. Pt wanted to make pcp "heard her" at visit, "I do not want surgery." I informed patient to call them to see what other services they offered.

## 2018-09-19 NOTE — Telephone Encounter (Signed)
Contacted patient and provided reassurance that she does not need to pursue surgery with bariatric services.  Patient appreciative and questions were answered.  Patient will follow-up with bariatric clinic to discuss nonsurgical options.  Harriet Butte, [place], PGY-3

## 2018-10-01 ENCOUNTER — Other Ambulatory Visit: Payer: Self-pay | Admitting: Family Medicine

## 2018-10-09 DIAGNOSIS — E119 Type 2 diabetes mellitus without complications: Secondary | ICD-10-CM | POA: Diagnosis not present

## 2018-10-09 DIAGNOSIS — H2513 Age-related nuclear cataract, bilateral: Secondary | ICD-10-CM | POA: Diagnosis not present

## 2018-10-09 DIAGNOSIS — H25013 Cortical age-related cataract, bilateral: Secondary | ICD-10-CM | POA: Diagnosis not present

## 2018-10-12 ENCOUNTER — Encounter: Payer: Self-pay | Admitting: Family Medicine

## 2018-10-12 ENCOUNTER — Ambulatory Visit (INDEPENDENT_AMBULATORY_CARE_PROVIDER_SITE_OTHER): Payer: 59 | Admitting: Family Medicine

## 2018-10-12 DIAGNOSIS — H269 Unspecified cataract: Secondary | ICD-10-CM | POA: Diagnosis not present

## 2018-10-12 NOTE — Progress Notes (Signed)
   Subjective   Patient ID: [name]    DOB: [birth year], 63 y.o. female   MRN: [id number]  CC: "Follow-up to discuss weight"  HPI: [name] is a 63 y.o. female who presents to clinic today for the following:  Obesity: Patient presenting for follow-up after referral to bariatric clinic.  Patient has not filled out paperwork to begin the process for referral.  She was originally hesitant due to thoughts of required surgical intervention.  This was discussed over the phone and patient was reassured she does not have to proceed with surgery if she chooses not to.  Patient feeling great over the last several weeks and has been exercising with her Gazelle machine at home 10 minutes every other day and plans to begin core strength exercises later this week.  She does continue to eat at least 1-2 meals daily, large meal varies.  She usually snacks with popcorn or fruit.  She has switched to on sweetened coffee creamer and avoids other sugary beverages.  History of right-sided peripheral vision loss: Patient recently seen by ophthalmologist last Monday.  Was told she had bilateral cataracts without signs of macular degeneration or retinopathy given history of diabetes.  Patient states her vision is okay except she felt that the lines were blurry during her evaluation.  Patient does drive.  She has no issues with her vision at present.  Patient denies headache or feelings of syncope.  ROS: see HPI for pertinent.  [place]: Uncontrolled NIDDM, HTN, GERD, morbid obesity. Surgical history tubal ligation, D&C with myosure (age 35 for endometrial polyp). Family history heart disease, HTN. Smoking status reviewed. Medications reviewed.  Objective   BP 125/80   Pulse 83   Temp 98.6 F (37 C) (Oral)   Wt 259 lb 9.6 oz (117.8 kg)   SpO2 96%   BMI 45.99 kg/m  Vitals and nursing note reviewed.  General: ambulating with cane, well nourished, well developed, NAD with non-toxic  appearance HEENT: normocephalic, atraumatic, moist mucous membranes, PERRLA, EOMI Cardiovascular: regular rate and rhythm without murmurs, rubs, or gallops Lungs: clear to auscultation bilaterally with normal work of breathing Abdomen: obese, soft, non-tender, non-distended, normoactive bowel sounds Skin: warm, dry, no rashes or lesions, cap refill < 2 seconds Extremities: warm and well perfused, normal tone, no edema  Assessment & Plan   Bilateral cataracts Recent diagnosis by ophthalmologist.  Per patient report.  Waiting on records.  Known diabetes without retinopathy or macular degeneration. - Follow-up with ophthalmologist   Severe obesity (BMI >= 40) (HCC) Chronic.  Weight 259 lbs.  Comorbidities include diabetes, HTN, HLD.  Patient optimistic about lifestyle modifications. - Encourage patient to continue with caloric restriction and discuss diet modification along with exercise - Set goal of 100 lb weight loss over the next year and given contact information to follow-up with bariatric clinic  No orders of the defined types were placed in this encounter.  No orders of the defined types were placed in this encounter.   Harriet Butte, [place], PGY-3 10/13/2018, 9:27 AM

## 2018-10-12 NOTE — Patient Instructions (Addendum)
Thank you for coming in to see [place] today. Please see below to review our plan for today's visit.  I am so pleased to hear you are doing well and have more energy!  Before a referral can be made the patient has to attend a seminar on Bariatric Surgery. You can do this by calling [phone] or go online at [email or web address]. Once this is completed and informational packet received back to them a referral then can be placed. I have mailed a letter to the patient explaining this.  Please call the clinic at [phone] if your symptoms worsen or you have any concerns. It was our pleasure to serve you.  Harriet Butte, [place], PGY-3

## 2018-10-13 DIAGNOSIS — H269 Unspecified cataract: Secondary | ICD-10-CM | POA: Insufficient documentation

## 2018-10-13 NOTE — Assessment & Plan Note (Signed)
Recent diagnosis by ophthalmologist.  Per patient report.  Waiting on records.  Known diabetes without retinopathy or macular degeneration. - Follow-up with ophthalmologist

## 2018-10-13 NOTE — Assessment & Plan Note (Signed)
Chronic.  Weight 259 lbs.  Comorbidities include diabetes, HTN, HLD.  Patient optimistic about lifestyle modifications. - Encourage patient to continue with caloric restriction and discuss diet modification along with exercise - Set goal of 100 lb weight loss over the next year and given contact information to follow-up with bariatric clinic

## 2018-10-23 ENCOUNTER — Ambulatory Visit: Payer: 59 | Admitting: Podiatry

## 2018-10-31 ENCOUNTER — Other Ambulatory Visit: Payer: Self-pay | Admitting: Family Medicine

## 2018-10-31 DIAGNOSIS — E1165 Type 2 diabetes mellitus with hyperglycemia: Principal | ICD-10-CM

## 2018-10-31 DIAGNOSIS — IMO0001 Reserved for inherently not codable concepts without codable children: Secondary | ICD-10-CM

## 2018-12-02 ENCOUNTER — Other Ambulatory Visit: Payer: Self-pay | Admitting: Family Medicine

## 2018-12-02 DIAGNOSIS — E1165 Type 2 diabetes mellitus with hyperglycemia: Principal | ICD-10-CM

## 2018-12-02 DIAGNOSIS — IMO0001 Reserved for inherently not codable concepts without codable children: Secondary | ICD-10-CM

## 2018-12-28 ENCOUNTER — Other Ambulatory Visit: Payer: Self-pay | Admitting: Family Medicine

## 2018-12-28 DIAGNOSIS — IMO0001 Reserved for inherently not codable concepts without codable children: Secondary | ICD-10-CM

## 2018-12-28 DIAGNOSIS — E1165 Type 2 diabetes mellitus with hyperglycemia: Principal | ICD-10-CM

## 2019-02-03 ENCOUNTER — Other Ambulatory Visit: Payer: Self-pay | Admitting: Family Medicine

## 2019-03-01 ENCOUNTER — Other Ambulatory Visit: Payer: Self-pay | Admitting: Family Medicine

## 2019-03-01 DIAGNOSIS — K219 Gastro-esophageal reflux disease without esophagitis: Secondary | ICD-10-CM

## 2019-03-01 DIAGNOSIS — E669 Obesity, unspecified: Secondary | ICD-10-CM

## 2019-04-16 ENCOUNTER — Other Ambulatory Visit: Payer: Self-pay | Admitting: *Deleted

## 2019-04-17 MED ORDER — MELOXICAM 7.5 MG PO TABS: 7.5000 mg | ORAL_TABLET | Freq: Two times a day (BID) | ORAL | 0 refills | Status: DC

## 2019-04-24 ENCOUNTER — Ambulatory Visit (INDEPENDENT_AMBULATORY_CARE_PROVIDER_SITE_OTHER): Payer: 59 | Admitting: Family Medicine

## 2019-04-24 ENCOUNTER — Other Ambulatory Visit: Payer: Self-pay

## 2019-04-24 ENCOUNTER — Encounter: Payer: Self-pay | Admitting: Family Medicine

## 2019-04-24 VITALS — BP 150/90 | HR 67 | Temp 98.5°F | Wt 255.0 lb

## 2019-04-24 DIAGNOSIS — I10 Essential (primary) hypertension: Secondary | ICD-10-CM

## 2019-04-24 DIAGNOSIS — E0865 Diabetes mellitus due to underlying condition with hyperglycemia: Secondary | ICD-10-CM | POA: Diagnosis not present

## 2019-04-24 DIAGNOSIS — E0840 Diabetes mellitus due to underlying condition with diabetic neuropathy, unspecified: Secondary | ICD-10-CM | POA: Diagnosis not present

## 2019-04-24 DIAGNOSIS — E1169 Type 2 diabetes mellitus with other specified complication: Secondary | ICD-10-CM | POA: Diagnosis not present

## 2019-04-24 DIAGNOSIS — IMO0002 Reserved for concepts with insufficient information to code with codable children: Secondary | ICD-10-CM

## 2019-04-24 DIAGNOSIS — E782 Mixed hyperlipidemia: Secondary | ICD-10-CM

## 2019-04-24 LAB — POCT GLYCOSYLATED HEMOGLOBIN (HGB A1C): HbA1c, POC (controlled diabetic range): 6.5 % (ref 0.0–7.0)

## 2019-04-24 NOTE — Progress Notes (Signed)
   Subjective:    Patient ID: [name], female    DOB: December 30, 1955, 63 y.o.   MRN: [id number]   CC: Diabetes check and to meet her new PCP.  HPI: She is doing well. She is happy about her new A1c result today (6.5). She says she has been eating well and taking metformin. Today she is concerned about her ankles which are intermittently displaying pitting edema over the last few months. The swelling will decrease overnight but gets worse with sitting all day. She also complains of increase in polyuria at night; daily she only urinates on average twice. Her blood pressure is increased today and she attributes that to her just having taken her medication before the visit. She is not taking a statin for her hyperlipidemia due to myalgias with use of atorvastatin.   Smoking status reviewed  Review of Systems Per HPI, also denies recent illness, fever, headache, changes in vision, chest pain, shortness of breath, abdominal pain, N/V/D, weakness   Patient Active Problem List   Diagnosis Date Noted  . Bilateral cataracts 10/13/2018  . Allergic rhinitis 07/11/2017  . Unilateral primary osteoarthritis, left knee 12/20/2016  . Heart murmur on physical examination 12/09/2014  . Sciatica of right side 02/06/2014  . Diabetes mellitus due to underlying condition, uncontrolled, with diabetic neuropathy, without long-term current use of insulin ([place]) 03/08/2008  . [place] 03/31/2007  . Mixed hyperlipidemia due to type 2 diabetes mellitus ([place]) 12/01/2006  . Severe obesity (BMI >= 40) ([place]) 12/01/2006  . Primary hypertension 12/01/2006  . GERD (gastroesophageal reflux disease) 12/01/2006     Objective:  BP (!) 150/90   Pulse 67   Temp 98.5 F (36.9 C) (Oral)   Wt 255 lb (115.7 kg)   SpO2 97%   BMI 45.17 kg/m  Vitals and nursing note reviewed  General: NAD, pleasant Cardiac: RRR, normal heart sounds, no murmurs Respiratory: CTAB, normal effort Extremities: 2+ pitting ankle edema  bilaterally, distal pulses are intact Skin: warm and dry, no rashes noted Neuro: alert and oriented, no focal deficits Psych: normal affect  Assessment & Plan:    Primary hypertension 1. Continue medication for HTN as perscribed. 2. Take BP log over the next two weeks (same time everyday at rest 3. Return in 2 weeks to discuss log with change in medication regime. 4. Lipid panel  Diabetes mellitus due to underlying condition, uncontrolled, with diabetic neuropathy, without long-term current use of insulin ([place]) 1. A1c (6.5)  2. Continue Metformin and personal nutritional diet   Mixed hyperlipidemia due to type 2 diabetes mellitus ([place]) 3. Lipid panel 4. Return in 2 weeks to discuss starting a low dose statin    Gerlene Fee, DO Family Medicine Resident PGY-1

## 2019-04-24 NOTE — Assessment & Plan Note (Signed)
3. Lipid panel 4. Return in 2 weeks to discuss starting a low dose statin

## 2019-04-24 NOTE — Assessment & Plan Note (Addendum)
1. A1c (6.5)  2. Continue Metformin and personal nutritional diet

## 2019-04-24 NOTE — Assessment & Plan Note (Addendum)
1. Continue medication for HTN as perscribed. 2. Take BP log over the next two weeks (same time everyday at rest 3. Return in 2 weeks to discuss log with change in medication regime. 4. Lipid panel

## 2019-04-24 NOTE — Patient Instructions (Addendum)
It was very nice to meet you please follow up in 2 weeks to discuss blood pressure log and lab results.   Managing Your Hypertension Hypertension is commonly called high blood pressure. This is when the force of your blood pressing against the walls of your arteries is too strong. Arteries are blood vessels that carry blood from your heart throughout your body. Hypertension forces the heart to work harder to pump blood, and may cause the arteries to become narrow or stiff. Having untreated or uncontrolled hypertension can cause heart attack, stroke, kidney disease, and other problems. What are blood pressure readings? A blood pressure reading consists of a higher number over a lower number. Ideally, your blood pressure should be below 120/80. The first ("top") number is called the systolic pressure. It is a measure of the pressure in your arteries as your heart beats. The second ("bottom") number is called the diastolic pressure. It is a measure of the pressure in your arteries as the heart relaxes. What does my blood pressure reading mean? Blood pressure is classified into four stages. Based on your blood pressure reading, your health care provider may use the following stages to determine what type of treatment you need, if any. Systolic pressure and diastolic pressure are measured in a unit called mm Hg. Normal  Systolic pressure: below 790.  Diastolic pressure: below 80. Elevated  Systolic pressure: 240-973.  Diastolic pressure: below 80. Hypertension stage 1  Systolic pressure: 532-992.  Diastolic pressure: 42-68. Hypertension stage 2  Systolic pressure: 341 or above.  Diastolic pressure: 90 or above. What health risks are associated with hypertension? Managing your hypertension is an important responsibility. Uncontrolled hypertension can lead to:  A heart attack.  A stroke.  A weakened blood vessel (aneurysm).  Heart failure.  Kidney damage.  Eye damage.  Metabolic  syndrome.  Memory and concentration problems. What changes can I make to manage my hypertension? Hypertension can be managed by making lifestyle changes and possibly by taking medicines. Your health care provider will help you make a plan to bring your blood pressure within a normal range. Eating and drinking   Eat a diet that is high in fiber and potassium, and low in salt (sodium), added sugar, and fat. An example eating plan is called the DASH (Dietary Approaches to Stop Hypertension) diet. To eat this way: ? Eat plenty of fresh fruits and vegetables. Try to fill half of your plate at each meal with fruits and vegetables. ? Eat whole grains, such as whole wheat pasta, brown rice, or whole grain bread. Fill about one quarter of your plate with whole grains. ? Eat low-fat diary products. ? Avoid fatty cuts of meat, processed or cured meats, and poultry with skin. Fill about one quarter of your plate with lean proteins such as fish, chicken without skin, beans, eggs, and tofu. ? Avoid premade and processed foods. These tend to be higher in sodium, added sugar, and fat.  Reduce your daily sodium intake. Most people with hypertension should eat less than 1,500 mg of sodium a day.  Limit alcohol intake to no more than 1 drink a day for nonpregnant women and 2 drinks a day for men. One drink equals 12 oz of beer, 5 oz of wine, or 1 oz of hard liquor. Lifestyle  Work with your health care provider to maintain a healthy body weight, or to lose weight. Ask what an ideal weight is for you.  Get at least 30 minutes of exercise that  causes your heart to beat faster (aerobic exercise) most days of the week. Activities may include walking, swimming, or biking.  Include exercise to strengthen your muscles (resistance exercise), such as weight lifting, as part of your weekly exercise routine. Try to do these types of exercises for 30 minutes at least 3 days a week.  Do not use any products that contain  nicotine or tobacco, such as cigarettes and e-cigarettes. If you need help quitting, ask your health care provider.  Control any long-term (chronic) conditions you have, such as high cholesterol or diabetes. Monitoring  Monitor your blood pressure at home as told by your health care provider. Your personal target blood pressure may vary depending on your medical conditions, your age, and other factors.  Have your blood pressure checked regularly, as often as told by your health care provider. Working with your health care provider  Review all the medicines you take with your health care provider because there may be side effects or interactions.  Talk with your health care provider about your diet, exercise habits, and other lifestyle factors that may be contributing to hypertension.  Visit your health care provider regularly. Your health care provider can help you create and adjust your plan for managing hypertension. Will I need medicine to control my blood pressure? Your health care provider may prescribe medicine if lifestyle changes are not enough to get your blood pressure under control, and if:  Your systolic blood pressure is 130 or higher.  Your diastolic blood pressure is 80 or higher. Take medicines only as told by your health care provider. Follow the directions carefully. Blood pressure medicines must be taken as prescribed. The medicine does not work as well when you skip doses. Skipping doses also puts you at risk for problems. Contact a health care provider if:  You think you are having a reaction to medicines you have taken.  You have repeated (recurrent) headaches.  You feel dizzy.  You have swelling in your ankles.  You have trouble with your vision. Get help right away if:  You develop a severe headache or confusion.  You have unusual weakness or numbness, or you feel faint.  You have severe pain in your chest or abdomen.  You vomit repeatedly.  You have  trouble breathing. Summary  Hypertension is when the force of blood pumping through your arteries is too strong. If this condition is not controlled, it may put you at risk for serious complications.  Your personal target blood pressure may vary depending on your medical conditions, your age, and other factors. For most people, a normal blood pressure is less than 120/80.  Hypertension is managed by lifestyle changes, medicines, or both. Lifestyle changes include weight loss, eating a healthy, low-sodium diet, exercising more, and limiting alcohol. This information is not intended to replace advice given to you by your health care provider. Make sure you discuss any questions you have with your health care provider. Document Released: 06/14/2012 Document Revised: 01/12/2019 Document Reviewed: 08/18/2016 Elsevier Patient Education  2020 [employer].

## 2019-04-25 ENCOUNTER — Telehealth: Payer: Self-pay | Admitting: Family Medicine

## 2019-04-25 LAB — COMPREHENSIVE METABOLIC PANEL
ALT: 11 IU/L (ref 0–32)
AST: 17 IU/L (ref 0–40)
Albumin/Globulin Ratio: 1.5 (ref 1.2–2.2)
Albumin: 4.3 g/dL (ref 3.8–4.8)
Alkaline Phosphatase: 67 IU/L (ref 39–117)
BUN/Creatinine Ratio: 14 (ref 12–28)
BUN: 13 mg/dL (ref 8–27)
Bilirubin Total: 0.2 mg/dL (ref 0.0–1.2)
CO2: 23 mmol/L (ref 20–29)
Calcium: 9.6 mg/dL (ref 8.7–10.3)
Chloride: 101 mmol/L (ref 96–106)
Creatinine, Ser: 0.95 mg/dL (ref 0.57–1.00)
GFR calc Af Amer: 73 mL/min/{1.73_m2} (ref >59)
GFR calc non Af Amer: 63 mL/min/{1.73_m2} (ref >59)
Globulin, Total: 2.9 g/dL (ref 1.5–4.5)
Glucose: 137 mg/dL — ABNORMAL HIGH (ref 65–99)
Potassium: 4.2 mmol/L (ref 3.5–5.2)
Sodium: 143 mmol/L (ref 134–144)
Total Protein: 7.2 g/dL (ref 6.0–8.5)

## 2019-04-25 LAB — LIPID PANEL
Chol/HDL Ratio: 4 ratio (ref 0.0–4.4)
Cholesterol, Total: 295 mg/dL — ABNORMAL HIGH (ref 100–199)
HDL: 74 mg/dL (ref >39)
LDL Calculated: 182 mg/dL — ABNORMAL HIGH (ref 0–99)
Triglycerides: 193 mg/dL — ABNORMAL HIGH (ref 0–149)
VLDL Cholesterol Cal: 39 mg/dL (ref 5–40)

## 2019-04-25 NOTE — Progress Notes (Signed)
[name] Lipid panel is abnormal with increased total cholesterol and LDL with some improvement with triglycerides. She needs to come in and see me in 2 weeks with her BP log to discuss HTN and hyperlipidemia management. I will call her this afternoon with the results.

## 2019-04-25 NOTE — Telephone Encounter (Signed)
I called [name] and she was not able to talk at the moment being that she was at the dentist. If she would like to pick her results up that would be ok, otherwise we can discuss them when she returns in 2 weeks. I will try her back later this afternoon if time permits.

## 2019-05-07 ENCOUNTER — Other Ambulatory Visit: Payer: Self-pay

## 2019-05-07 DIAGNOSIS — IMO0001 Reserved for inherently not codable concepts without codable children: Secondary | ICD-10-CM

## 2019-05-07 MED ORDER — METFORMIN HCL ER 500 MG PO TB24: 1000.0000 mg | ORAL_TABLET | Freq: Two times a day (BID) | ORAL | 3 refills | Status: DC

## 2019-05-10 ENCOUNTER — Other Ambulatory Visit: Payer: Self-pay

## 2019-05-10 ENCOUNTER — Ambulatory Visit (INDEPENDENT_AMBULATORY_CARE_PROVIDER_SITE_OTHER): Payer: 59 | Admitting: Family Medicine

## 2019-05-10 ENCOUNTER — Encounter: Payer: Self-pay | Admitting: Family Medicine

## 2019-05-10 VITALS — BP 150/74 | HR 86 | Ht 63.0 in | Wt 257.5 lb

## 2019-05-10 DIAGNOSIS — E782 Mixed hyperlipidemia: Secondary | ICD-10-CM

## 2019-05-10 DIAGNOSIS — I10 Essential (primary) hypertension: Secondary | ICD-10-CM

## 2019-05-10 DIAGNOSIS — Z0000 Encounter for general adult medical examination without abnormal findings: Secondary | ICD-10-CM | POA: Insufficient documentation

## 2019-05-10 DIAGNOSIS — Z23 Encounter for immunization: Secondary | ICD-10-CM

## 2019-05-10 DIAGNOSIS — E1169 Type 2 diabetes mellitus with other specified complication: Secondary | ICD-10-CM | POA: Diagnosis not present

## 2019-05-10 MED ORDER — ROSUVASTATIN CALCIUM 20 MG PO TABS: 20.0000 mg | ORAL_TABLET | Freq: Every day | ORAL | 3 refills | Status: DC

## 2019-05-10 MED ORDER — METOPROLOL TARTRATE 100 MG PO TABS: 100.0000 mg | ORAL_TABLET | Freq: Two times a day (BID) | ORAL | 3 refills | Status: DC

## 2019-05-10 NOTE — Assessment & Plan Note (Addendum)
1. Continue Enalipril 20mg  BID Stop Lopressor 50mg  BID and start Lopressor 100mg  BID Continue Hygroton 50mg  QD 2. Continue to check BP and HR regularly at home. Follow up with me during the first week of medication change via Mychart with log. 3. Decrease processed food and salt intake

## 2019-05-10 NOTE — Progress Notes (Signed)
    Subjective:    Patient ID: [name], female    DOB: 01-16-1956, 63 y.o.   MRN: [id number]   CC: 2 week follow up to discuss hypertension and hyperlipidemia medication regimen.  HPI: She is doing well and has no complaints today. She does not have her log with her today but will send it via MyChart. She has expressed that she does not want to add on another medication for her blood pressure at this time. She takes her blood pressure at home with a wrist cuff on her right wrist. She states they have averaged 591-638 systolic to 46-65 diastolic. In office her blood pressures continually run in the 150/80s range. We discussed trying an arm cuff as well as switching the wrist cuff to the left for a more accurate reading. Today we also discussed her lipid levels and starting a statin for improvement. She is open to her medication regimen change.   Smoking status reviewed  Review of Systems Per HPI, also denies recent illness, fever, headache, changes in vision, chest pain, shortness of breath, abdominal pain, N/V/D, weakness.  Patient Active Problem List   Diagnosis Date Noted  . Bilateral cataracts 10/13/2018  . Allergic rhinitis 07/11/2017  . Unilateral primary osteoarthritis, left knee 12/20/2016  . Heart murmur on physical examination 12/09/2014  . Sciatica of right side 02/06/2014  . Diabetes mellitus due to underlying condition, uncontrolled, with diabetic neuropathy, without long-term current use of insulin ([place]) 03/08/2008  . Lipoma 03/31/2007  . Mixed hyperlipidemia due to type 2 diabetes mellitus ([place]) 12/01/2006  . Severe obesity (BMI >= 40) ([place]) 12/01/2006  . Primary hypertension 12/01/2006  . GERD (gastroesophageal reflux disease) 12/01/2006     Objective:  BP (!) 150/74   Pulse 86   Ht 5\' 3"  (1.6 m)   Wt 257 lb 8 oz (116.8 kg)   SpO2 96%   BMI 45.61 kg/m  Vitals and nursing note reviewed  General: NAD, pleasant Cardiac: RRR, normal heart sounds  Respiratory: normal effort Extremities: mild edema, no cyanosis. WWP. Skin: warm and dry, no rashes noted Neuro: alert and oriented, no focal deficits Psych: normal affect  Assessment & Plan:    Primary hypertension 1. Continue Enalipril 20mg  BID Stop Lopressor 50mg  BID and start Lopressor 100mg  BID Continue Hygroton 50mg  QD 2. Continue to check BP and HR regularly at home. Follow up with me during the first week of medication change via Mychart with log. 3. Decrease processed food and salt intake  Mixed hyperlipidemia due to type 2 diabetes mellitus (HCC) 1. Start Rosuvastatin 20 mg QD 2. Follow up via mychart within the first few weeks of the new medication change.  Healthcare maintenance 1. Tdap given today 2.  Colonoscopy planned for next January 3. Overdue for mammogram, will schedule. 4. Follow up in 6 months for diabetes check or sooner if needed.    Gerlene Fee, DO Family Medicine Resident PGY-1

## 2019-05-10 NOTE — Assessment & Plan Note (Addendum)
1. Tdap given today 2.  Colonoscopy planned for next January 3. Overdue for mammogram, will schedule. 4. Follow up in 6 months for diabetes check or sooner if needed.

## 2019-05-10 NOTE — Assessment & Plan Note (Addendum)
1. Start Rosuvastatin 20 mg QD 2. Follow up via mychart within the first few weeks of the new medication change.

## 2019-05-17 ENCOUNTER — Telehealth: Payer: Self-pay | Admitting: Orthopaedic Surgery

## 2019-05-17 MED ORDER — TRAMADOL HCL 50 MG PO TABS: ORAL_TABLET | ORAL | 0 refills | Status: DC

## 2019-05-17 NOTE — Telephone Encounter (Signed)
Patient left a message requesting an RX refill on her Tramadol.  CB#[phone].  Thank you.

## 2019-05-17 NOTE — Telephone Encounter (Signed)
Yes #20 please.  1-2 daily prn

## 2019-05-17 NOTE — Telephone Encounter (Signed)
Called to pharmacy Advised patient done.

## 2019-05-17 NOTE — Telephone Encounter (Signed)
Ok to rf? 

## 2019-05-29 ENCOUNTER — Other Ambulatory Visit: Payer: Self-pay | Admitting: *Deleted

## 2019-05-29 MED ORDER — ENALAPRIL MALEATE 20 MG PO TABS: 20.0000 mg | ORAL_TABLET | Freq: Two times a day (BID) | ORAL | 3 refills | Status: DC

## 2019-06-02 ENCOUNTER — Other Ambulatory Visit: Payer: Self-pay | Admitting: Family Medicine

## 2019-07-02 ENCOUNTER — Other Ambulatory Visit: Payer: Self-pay | Admitting: Family Medicine

## 2019-08-08 ENCOUNTER — Other Ambulatory Visit: Payer: Self-pay | Admitting: Family Medicine

## 2019-08-13 ENCOUNTER — Other Ambulatory Visit: Payer: Self-pay

## 2019-08-13 DIAGNOSIS — K219 Gastro-esophageal reflux disease without esophagitis: Secondary | ICD-10-CM

## 2019-08-13 DIAGNOSIS — E669 Obesity, unspecified: Secondary | ICD-10-CM

## 2019-08-13 MED ORDER — OMEPRAZOLE 20 MG PO CPDR: 20.0000 mg | DELAYED_RELEASE_CAPSULE | Freq: Every day | ORAL | 2 refills | Status: DC

## 2019-09-02 ENCOUNTER — Other Ambulatory Visit: Payer: Self-pay | Admitting: Family Medicine

## 2019-09-07 ENCOUNTER — Other Ambulatory Visit: Payer: Self-pay | Admitting: Family Medicine

## 2019-09-07 DIAGNOSIS — Z1231 Encounter for screening mammogram for malignant neoplasm of breast: Secondary | ICD-10-CM

## 2019-10-04 ENCOUNTER — Other Ambulatory Visit: Payer: Self-pay | Admitting: Family Medicine

## 2019-10-12 ENCOUNTER — Ambulatory Visit: Payer: 59 | Admitting: Family Medicine

## 2019-10-19 ENCOUNTER — Other Ambulatory Visit: Payer: Self-pay | Admitting: Family Medicine

## 2019-10-29 ENCOUNTER — Ambulatory Visit
Admission: RE | Admit: 2019-10-29 | Discharge: 2019-10-29 | Disposition: A | Discharge status: Home / Self Care | Payer: 59 | Source: Ambulatory Visit | Attending: Family Medicine | Admitting: Family Medicine

## 2019-10-29 ENCOUNTER — Other Ambulatory Visit: Payer: Self-pay

## 2019-10-29 DIAGNOSIS — Z1231 Encounter for screening mammogram for malignant neoplasm of breast: Secondary | ICD-10-CM

## 2019-11-02 ENCOUNTER — Other Ambulatory Visit: Payer: Self-pay | Admitting: Family Medicine

## 2019-11-02 DIAGNOSIS — E669 Obesity, unspecified: Secondary | ICD-10-CM

## 2019-11-02 DIAGNOSIS — K219 Gastro-esophageal reflux disease without esophagitis: Secondary | ICD-10-CM

## 2019-11-09 ENCOUNTER — Ambulatory Visit (INDEPENDENT_AMBULATORY_CARE_PROVIDER_SITE_OTHER): Payer: 59 | Admitting: Family Medicine

## 2019-11-09 ENCOUNTER — Other Ambulatory Visit: Payer: Self-pay

## 2019-11-09 VITALS — BP 137/80 | HR 108 | Wt 256.0 lb

## 2019-11-09 DIAGNOSIS — G4700 Insomnia, unspecified: Secondary | ICD-10-CM | POA: Diagnosis not present

## 2019-11-09 DIAGNOSIS — E1165 Type 2 diabetes mellitus with hyperglycemia: Secondary | ICD-10-CM | POA: Diagnosis not present

## 2019-11-09 DIAGNOSIS — E0840 Diabetes mellitus due to underlying condition with diabetic neuropathy, unspecified: Secondary | ICD-10-CM

## 2019-11-09 DIAGNOSIS — Z23 Encounter for immunization: Secondary | ICD-10-CM

## 2019-11-09 DIAGNOSIS — E1140 Type 2 diabetes mellitus with diabetic neuropathy, unspecified: Secondary | ICD-10-CM | POA: Diagnosis not present

## 2019-11-09 DIAGNOSIS — E0865 Diabetes mellitus due to underlying condition with hyperglycemia: Secondary | ICD-10-CM

## 2019-11-09 DIAGNOSIS — Z0000 Encounter for general adult medical examination without abnormal findings: Secondary | ICD-10-CM

## 2019-11-09 DIAGNOSIS — IMO0002 Reserved for concepts with insufficient information to code with codable children: Secondary | ICD-10-CM

## 2019-11-09 LAB — POCT GLYCOSYLATED HEMOGLOBIN (HGB A1C): HbA1c, POC (controlled diabetic range): 6.8 % (ref 0.0–7.0)

## 2019-11-09 MED ORDER — TRAZODONE HCL 50 MG PO TABS: 50.0000 mg | ORAL_TABLET | Freq: Every evening | ORAL | 2 refills | Status: DC | PRN

## 2019-11-09 NOTE — Patient Instructions (Signed)
It was very nice to meet you today. Please enjoy the rest of your week. Follow up in 6 months   Please call the clinic at [phone] if your symptoms worsen or you have any concerns. It was our pleasure to serve you.    Therapy and Counseling Resources Most providers on this list will take Medicaid. Patients with commercial insurance or Medicare should contact their insurance company to get a list of in network providers.  [facility] [address] [place], [place] [address]       [phone]     [facility] [address], [place], [place]    Jinny Blossom [facility] [address] [place], [place]  Family Solutions:  [place] [place]  [facility]:  [place] [address] Loni Muse, [place]  [facility] (under & uninsured) [address] [place] [phone]    [email or web address] [facility] [place]     Phone:  [phone]     [place]  [place] [address]      [place], [place] ext [place]: [place], [place], [place]   [place] ([place] therapist) [address] [place] [address]   [place] [address]    [phone]    [facility]   [address],  [place], [place]   [place]  [place]  [facility]  [address] [place], [place]        [phone]  Open Access/Walk In Clinic under & uninsured [place],  [address] [place] [phone]):  Mon - Fri from 8 AM - 3 PM  Family Service of the [place],  ([place])   [place], [place]: [phone]) 8:30 - 12; 1 -  2:30  Family Service of the [place],  [place], [place]    [phone]):8:30 - 12; 2 - 3PM  [facility] [employer],  [address]  [place]; [phone]):   Mon - Fri 8 AM - 5 PM  [facility] [place]  MWF 12:30 to 3:00 or call to schedule an appointment  [phone]  Specific Provider options Psychology Today  [email or web address] 1. click on find a therapist  2. enter your zip code 3. left side and select or tailor a therapist for your specific need.   [facility] Provider Directory [email or web address]  (Medicaid)   Follow all drop down to find a provider  [place] or [email or web address], [place], [place] Recovery support and educational   In home counseling [place] Telephone: [phone]  office in [place] [email or web address]   Does not take reg. Medicaid or Medicare private insurance [facility], [place] health Choice, [facility], [place], [place], [place], [place] Health Choice  24- Hour Availability:  . [place] or [phone]  . Family Service of the [employer] [phone]  [facility] Crisis Service  [phone]   . [place]  [phone] (after hours)  . Therapeutic Alternative/Mobile Crisis   [phone]  . [place]  National Suicide Hotline  6400457350 ([place])  . Call 911 or go to emergency room  . [employer]  [phone]);  [place] and [employer]   . [facility] ACCESS  [phone]); [place], [place], [place], [place], [place], [place], [place]

## 2019-11-09 NOTE — Progress Notes (Signed)
   CHIEF COMPLAINT / HPI: [name] is a pleasant 64 y.o. female presenting today for a diabetes follow up.   T2DM, non-insulin dependent A1c 6.8. She does not check her blood sugars at home. Continues to take her metformin and is taking her statin every other day. Did not take her blood pressure medication today due to rushing. She does not have numbness or tingling in the hands or feet. She denies polyphagia but does endorse polyuria at night and polydipsia after increased activity that results in sweating. She has some discomfort with feet and toe cramping and attributes this to walking around her house without shoes recently.   Insomnia Has been having trouble sleeping since her father passed in December. He felt sick and went into the hospital and passed away shortly. The death certificate reads heart attack but she does not believe that is how he passed. She was present in the hospital room during time of death.  PERTINENT  PMH / PSH: T2DM, HTN, HLD.   OBJECTIVE: BP 137/80   Pulse (!) 108   Wt 256 lb (116.1 kg)   SpO2 96%   BMI 45.35 kg/m    General: Appears well, no acute distress. Age appropriate. Cardiac: RRR, normal heart sounds, no murmurs Respiratory: CTAB, normal effort Extremities: Antalgic gait Skin: Warm and dry, no rashes noted Neuro: alert and oriented, no focal deficits Psych: normal affect  ASSESSMENT / PLAN:  Diabetes mellitus due to underlying condition, uncontrolled, with diabetic neuropathy, without long-term current use of insulin ([place]) Well controlled. A1c 6.8.  -Continue metformin 1000mg  BID -Encourage taking statin daily -Follow up in 6 months, will need BMET then   Insomnia Insomnia for 1.5 months due to death of her father and being in close proximity at time of death. Has used trazodone in the past for sleep disturbance but has not had it refilled in years due to good sleep hygiene.  -Trazadone 50mg  at bedtime PRN refilled -Therapy and  Counseling resources given in AVS -Follow up as needed  Healthcare maintenance -Received flu shot   Gerlene Fee, [place]

## 2019-11-10 DIAGNOSIS — G4700 Insomnia, unspecified: Secondary | ICD-10-CM | POA: Insufficient documentation

## 2019-11-10 NOTE — Assessment & Plan Note (Addendum)
Well controlled. A1c 6.8.  -Continue metformin 1000mg  BID -Encourage taking statin daily -Follow up in 6 months, will need BMET then

## 2019-11-10 NOTE — Assessment & Plan Note (Signed)
Insomnia for 1.5 months due to death of her father and being in close proximity at time of death. Has used trazodone in the past for sleep disturbance but has not had it refilled in years due to good sleep hygiene.  -Trazadone 50mg  at bedtime PRN refilled -Therapy and Counseling resources given in AVS -Follow up as needed

## 2019-11-10 NOTE — Assessment & Plan Note (Signed)
Received flu shot

## 2019-11-12 ENCOUNTER — Ambulatory Visit: Payer: 59 | Admitting: Family Medicine

## 2019-11-12 ENCOUNTER — Ambulatory Visit (INDEPENDENT_AMBULATORY_CARE_PROVIDER_SITE_OTHER): Payer: 59 | Admitting: Family Medicine

## 2019-11-12 ENCOUNTER — Other Ambulatory Visit: Payer: Self-pay

## 2019-11-12 ENCOUNTER — Encounter: Payer: Self-pay | Admitting: Family Medicine

## 2019-11-12 ENCOUNTER — Telehealth: Payer: Self-pay

## 2019-11-12 VITALS — BP 134/80 | HR 75 | Wt 253.6 lb

## 2019-11-12 DIAGNOSIS — M79644 Pain in right finger(s): Secondary | ICD-10-CM | POA: Diagnosis not present

## 2019-11-12 NOTE — Patient Instructions (Addendum)
Please go to [facility]  [address], [address], [place] [address]  They have you scheduled for 3:15pm. Phone: [phone]  Be well, Dr. Ardelia Mems

## 2019-11-12 NOTE — Telephone Encounter (Signed)
Patient calls nurse line regarding swelling in R index finger. Pt was seen in the office on Friday, Feb 5th. Patient states that swelling started Friday evening and has been warm to touch. States that she has been taking tylenol every 6 hours due to pain. Patient had concerns that she was having reaction to flu vaccine. Informed patient that this was not a common side effect of flu vaccine, however, due to other symptoms advised patient it would be best to have evaluated.   Talbot Grumbling, RN

## 2019-11-12 NOTE — Progress Notes (Signed)
Date of Visit: 11/12/2019   HPI:  Patient presents for a same day appointment to discuss R second digit pain.  Had flu shot in R deltoid on Friday during routine visit. That night began having pain in her RUE where the vaccine was given, like a normal post-vaccine pain. Subsequent to this, the pain radiated down her arm and has since localized in her R second digit. The finger is swollen and very sore to the touch. No known trauma. Has taken tylenol without significant relief. Patient concerned this is a possible reaction to the flu vaccine. She is right handed. No history of gout.  ROS: See HPI  [place]: history of cataracts, diabetes, hyperlipidemia, hypertension, obesity, GERD, allergic rhinitis  PHYSICAL EXAM: BP 134/80   Pulse 75   Wt 253 lb 9.6 oz (115 kg)   SpO2 98%   BMI 44.92 kg/m  Gen: no acute distress, pleasant, cooperative Extremity: R second digit visibly more swollen than the left. Very tender to palpation on dorsal and palmar surfaces of finger all along the digit. Diminished active ROM secondary to pain. No skin breakdown or lesions. No significant erythema visualized.  ASSESSMENT/PLAN:  R second digit pain and swelling Unclear etiology. I doubt this is a reaction to flu vaccine (would be very atypical reaction if so). Does seem to have some inflammatory process going on, possibly infectious. I think she would benefit from imaging and seeing a specialist promptly given the severity of her pain. Called hand surgery office on call [facility]) and was able to get patient scheduled for this afternoon. She will go directly to their office to be seen. Patient agreeable with this plan.  [place]. Ardelia Mems, [place]

## 2019-11-23 ENCOUNTER — Other Ambulatory Visit: Payer: Self-pay | Admitting: Family Medicine

## 2019-11-25 ENCOUNTER — Other Ambulatory Visit: Payer: Self-pay | Admitting: Family Medicine

## 2019-11-25 DIAGNOSIS — M1712 Unilateral primary osteoarthritis, left knee: Secondary | ICD-10-CM

## 2019-11-25 MED ORDER — MELOXICAM 7.5 MG PO TABS: 7.5000 mg | ORAL_TABLET | Freq: Every day | ORAL | 0 refills | Status: DC

## 2020-01-05 ENCOUNTER — Other Ambulatory Visit: Payer: Self-pay | Admitting: Family Medicine

## 2020-01-05 DIAGNOSIS — M1712 Unilateral primary osteoarthritis, left knee: Secondary | ICD-10-CM

## 2020-01-08 ENCOUNTER — Ambulatory Visit (INDEPENDENT_AMBULATORY_CARE_PROVIDER_SITE_OTHER): Payer: Self-pay | Admitting: Family Medicine

## 2020-01-08 ENCOUNTER — Other Ambulatory Visit: Payer: Self-pay

## 2020-01-08 ENCOUNTER — Encounter: Payer: Self-pay | Admitting: Family Medicine

## 2020-01-08 VITALS — BP 144/82 | HR 66 | Ht 63.0 in | Wt 252.2 lb

## 2020-01-08 DIAGNOSIS — Z5181 Encounter for therapeutic drug level monitoring: Secondary | ICD-10-CM

## 2020-01-08 DIAGNOSIS — Z791 Long term (current) use of non-steroidal anti-inflammatories (NSAID): Secondary | ICD-10-CM

## 2020-01-08 DIAGNOSIS — M1712 Unilateral primary osteoarthritis, left knee: Secondary | ICD-10-CM

## 2020-01-08 MED ORDER — MELOXICAM 7.5 MG PO TABS: 7.5000 mg | ORAL_TABLET | Freq: Every day | ORAL | 0 refills | Status: DC

## 2020-01-08 NOTE — Patient Instructions (Signed)
It was great seeing you again today!  We will get a kidney test to check and make sure the meloxicam is not having an adverse effect on your kidneys.  I sent a 30-day supply of meloxicam, I will call when she know if there is something abnormal with your kidney function.  I would probably call your pharmacy and check and make sure that your Metformin was not one of the manufactures affected by the recall.

## 2020-01-09 LAB — BASIC METABOLIC PANEL
BUN/Creatinine Ratio: 22 (ref 12–28)
BUN: 26 mg/dL (ref 8–27)
CO2: 23 mmol/L (ref 20–29)
Calcium: 9.4 mg/dL (ref 8.7–10.3)
Chloride: 105 mmol/L (ref 96–106)
Creatinine, Ser: 1.19 mg/dL — ABNORMAL HIGH (ref 0.57–1.00)
GFR calc Af Amer: 56 mL/min/{1.73_m2} — ABNORMAL LOW (ref >59)
GFR calc non Af Amer: 48 mL/min/{1.73_m2} — ABNORMAL LOW (ref >59)
Glucose: 105 mg/dL — ABNORMAL HIGH (ref 65–99)
Potassium: 4.1 mmol/L (ref 3.5–5.2)
Sodium: 142 mmol/L (ref 134–144)

## 2020-01-10 NOTE — Progress Notes (Signed)
   CHIEF COMPLAINT / HPI: 64 year old female who presents for medication management.  Patient has been taking chronic meloxicam 7.5 mg daily for unilateral knee osteoarthritis, left side.  Patient has been taking the meloxicam daily for several months at this point.  She is following up to get kidney function test and for refill.  PERTINENT  PMH / PSH: Left knee osteoarthritis   OBJECTIVE: BP (!) 144/82   Pulse 66   Ht 5\' 3"  (1.6 m)   Wt 252 lb 3.2 oz (114.4 kg)   SpO2 98%   BMI 44.68 kg/m   Gen: 64 year old African-American female, no acute distress CV: Warm and dry Resp: No accessory muscle use, no respiratory distress Neuro: Alert and oriented, Speech clear, No gross deficits  Left knee: Notable popping and grinding with extension and flexion of the knee.  ASSESSMENT / PLAN:  Unilateral primary osteoarthritis, left knee Will get BMP to assess kidney function.  Refill meloxicam to 7.5 mg daily for 1 month.  If kidney function significantly worse will update plan and discussed alternative.    Guadalupe Dawn MD PGY-3 Family Medicine Resident [place]

## 2020-01-10 NOTE — Assessment & Plan Note (Signed)
Will get BMP to assess kidney function.  Refill meloxicam to 7.5 mg daily for 1 month.  If kidney function significantly worse will update plan and discussed alternative.

## 2020-01-31 ENCOUNTER — Other Ambulatory Visit: Payer: Self-pay | Admitting: Family Medicine

## 2020-01-31 DIAGNOSIS — M1712 Unilateral primary osteoarthritis, left knee: Secondary | ICD-10-CM

## 2020-01-31 DIAGNOSIS — E669 Obesity, unspecified: Secondary | ICD-10-CM

## 2020-01-31 DIAGNOSIS — K219 Gastro-esophageal reflux disease without esophagitis: Secondary | ICD-10-CM

## 2020-02-06 ENCOUNTER — Other Ambulatory Visit: Payer: Self-pay | Admitting: *Deleted

## 2020-02-06 DIAGNOSIS — M1712 Unilateral primary osteoarthritis, left knee: Secondary | ICD-10-CM

## 2020-02-07 ENCOUNTER — Telehealth: Payer: Self-pay | Admitting: Family Medicine

## 2020-02-07 MED ORDER — MELOXICAM 7.5 MG PO TABS: 7.5000 mg | ORAL_TABLET | Freq: Every day | ORAL | 0 refills | Status: DC

## 2020-02-07 NOTE — Telephone Encounter (Signed)
Patient calls nurse line requesting meloxicam. I see where this medication was denied by PCP. Kris Mouton gave patient a refill on 4/6, however for only 1 tab per day. Patient states she normally gets 2 tabs per day, therefore she is out. Patient is requesting a refill on meloxicam BID. Please advise.

## 2020-02-07 NOTE — Addendum Note (Signed)
Addended by: Dorna Bloom on: 02/07/2020 04:34 PM   Modules accepted: Orders

## 2020-02-07 NOTE — Telephone Encounter (Signed)
Called patient to discuss the long term use of NSAIDs on her kidney function. Currently refilled 1 month supply of mobic but will further discuss other medications moving forward.   Dr. Janus Molder, DO

## 2020-02-07 NOTE — Addendum Note (Signed)
Addended by: Dorna Bloom on: 02/07/2020 04:08 PM   Modules accepted: Orders

## 2020-03-06 ENCOUNTER — Other Ambulatory Visit: Payer: Self-pay | Admitting: Family Medicine

## 2020-03-06 ENCOUNTER — Other Ambulatory Visit: Payer: Self-pay

## 2020-03-06 DIAGNOSIS — K219 Gastro-esophageal reflux disease without esophagitis: Secondary | ICD-10-CM

## 2020-03-06 DIAGNOSIS — E669 Obesity, unspecified: Secondary | ICD-10-CM

## 2020-03-06 MED ORDER — CHLORTHALIDONE 50 MG PO TABS: 50.0000 mg | ORAL_TABLET | Freq: Every day | ORAL | 3 refills | Status: DC

## 2020-03-17 NOTE — Telephone Encounter (Signed)
Pt would like to discuss her next options for knee pain.  She has not taken any in 7 days and her knees are beginning to hurt really bad.  PCP not available in clinic till the end of the month. Christen Bame, CMA

## 2020-03-18 ENCOUNTER — Other Ambulatory Visit: Payer: Self-pay | Admitting: Family Medicine

## 2020-03-18 DIAGNOSIS — M5431 Sciatica, right side: Secondary | ICD-10-CM

## 2020-03-18 DIAGNOSIS — M1712 Unilateral primary osteoarthritis, left knee: Secondary | ICD-10-CM

## 2020-03-18 MED ORDER — GABAPENTIN 100 MG PO CAPS: 100.0000 mg | ORAL_CAPSULE | Freq: Three times a day (TID) | ORAL | 3 refills | Status: DC

## 2020-03-18 NOTE — Telephone Encounter (Signed)
Patient calls nurse line following up with yesterdays call. Patient states she has been out of Mobic for ~7 days now. Patient reports she has been taking (4) 500mg  Tylenol daily, with very little relief. Patient states she would like to discuss starting Tramadol, as she has used this in the past. Patient scheduled for PCP first available, 04/15/2020. Patient is hopeful for a course of Tramadol until apt. Patient reports she is still working and would appreciate some relief. Will forward to PCP.

## 2020-03-18 NOTE — Telephone Encounter (Signed)
Connected with patient via phone today. Given the option of restarting voltaren gel or gabapentin, patient chose gabapentin. Stated she has been using an arthritis rub on her knees and tylenol and continues to be in considerable pain. Explained to the patient she can taking 100mg  TID and if this makes her sleepy during the day she can try 300mg  at night. If this does not give her relief she will call back. Otherwise I will see her 04/15/20. Thank you so much for your help with this patient.

## 2020-04-15 ENCOUNTER — Other Ambulatory Visit: Payer: Self-pay

## 2020-04-15 ENCOUNTER — Ambulatory Visit (INDEPENDENT_AMBULATORY_CARE_PROVIDER_SITE_OTHER): Payer: Self-pay | Admitting: Family Medicine

## 2020-04-15 VITALS — BP 145/75 | HR 75 | Ht 64.0 in | Wt 246.2 lb

## 2020-04-15 DIAGNOSIS — K219 Gastro-esophageal reflux disease without esophagitis: Secondary | ICD-10-CM

## 2020-04-15 DIAGNOSIS — M170 Bilateral primary osteoarthritis of knee: Secondary | ICD-10-CM

## 2020-04-15 DIAGNOSIS — R609 Edema, unspecified: Secondary | ICD-10-CM

## 2020-04-15 DIAGNOSIS — R600 Localized edema: Secondary | ICD-10-CM

## 2020-04-15 DIAGNOSIS — R21 Rash and other nonspecific skin eruption: Secondary | ICD-10-CM

## 2020-04-15 DIAGNOSIS — M1712 Unilateral primary osteoarthritis, left knee: Secondary | ICD-10-CM

## 2020-04-15 DIAGNOSIS — I10 Essential (primary) hypertension: Secondary | ICD-10-CM

## 2020-04-15 MED ORDER — ROSUVASTATIN CALCIUM 20 MG PO TABS: 20.0000 mg | ORAL_TABLET | Freq: Every day | ORAL | 3 refills | Status: DC

## 2020-04-15 MED ORDER — OMEPRAZOLE 20 MG PO CPDR: 40.0000 mg | DELAYED_RELEASE_CAPSULE | Freq: Every day | ORAL | 0 refills | Status: DC

## 2020-04-15 MED ORDER — DULOXETINE HCL 60 MG PO CPEP
60.0000 mg | ORAL_CAPSULE | Freq: Every day | ORAL | 2 refills | Status: DC
Start: 2020-04-15 — End: 2020-07-18

## 2020-04-15 NOTE — Progress Notes (Signed)
SUBJECTIVE:   CHIEF COMPLAINT / HPI:   [name] is a 64 year old female who presents for the following issues below.  Bilateral knee pain Has worsened since she has stopped taking mobic. She is now also having bilateral ankle pain. Was prescribed gabapentin 100 mg 3 times daily.  Feels as if this made it worse and did not help and possibly caused a rash.  Has also tried Tylenol arthritis 600 mg 3-4 tabs daily and this seems to help some. She will only take tramadol if absolutely necessary but she tries not to take this medication at all. Some days the pain gets so bad she cannot get down stairs.  Her daughter is present and says that this becomes a problem when she has to leave her at home by herself.  She uses a single cane for mobility.  Dependent edema Has a history of swollen ankles and feet that is worsened by sitting or standing all day as well as high sodium intake.  Her feet become more painful throughout the day and is worse at night.  She notices that when she elevates them it improves.  She denies shortness of breath, chest pain, or trauma to her ankles.  Also feels as if the swelling has improved since making her appointment.  Right buttock rash Noticed a rash around the time of starting gabapentin.  Unsure if this is related but had some concern.  Appears to improve and has never been itchy.  PERTINENT  PMH / PSH: Sciatica (Right side), Osteoarthritis, DM (Follow up 05/2020; Last A1c 6.8 11/09/2019 takes metformin only) HTN (compliant with antihypertensives), HLD (Crestor refilled), GERD (omeprazole increased to 40mg  daily)  OBJECTIVE:   BP (!) 145/75   Pulse 75   Ht 5\' 4"  (1.626 m)   Wt 246 lb 3.2 oz (111.7 kg)   BMI 42.26 kg/m    General: Appears well, no acute distress. Age appropriate. Use of single cane with antalgic gait. Cardiac: RRR, normal heart sounds, no murmurs Respiratory: CTAB, normal effort Extremities: BLE 2+ pitting edema up to calf. WWP. Tender  knee joints bilaterally. 5/5 LE strength. No skin changes. Wearing flat non-supportive flip flops.  Skin: Right cleft buttock skin discoloration without raise borders. No flaking. No erythema. Non infectious appearing.  ASSESSMENT/PLAN:   Osteoarthritis of both knees Recently stopped using Mobic due to not wanting to continue long-term use.  Attempted gabapentin with minimal relief.  Would encourage continuing Tylenol arthritis as prescribed.  Will attempt Cymbalta 60 mg daily.  Also counseled patient on using knee sleeves.  Will refer for aqua therapy with Joelyn Oms.  May consider acupuncture in the future.  Lower extremity edema Chronically intermittent. Likely dependent edema. Swelling seem most concentrated at ankle joint. Would use compression stockings or Ace wraps. And elevate legs above heart when not standing.  Also encouraged low-sodium diet.  Patient and daughter voiced understanding.  Patient does not have any heart history and has never has an echo that I am aware of. She does not have any symptoms of shortness of breath but could consider obtaining BNP or echo in the future if this continues to be an issue or other symptoms arise.   Rash of unknown etiology Improving. Does not look infected. No surrounding redness. Darker than skin complexion without raise borders. Noticed by daughter and not by patient. No pruritic. Likely contact dermatitis as patient wears depends. Would keep the are clean and dry and if irritated would use vaseline or  aquaphor.   Gerlene Fee, [place]

## 2020-04-15 NOTE — Patient Instructions (Signed)
It was very nice to see you today. Please enjoy the rest of your week. Today you were seen for lower leg swelling for this please use compression stockings and wear supportive shoes.  You were also seen for a buttock rash that appears to be improving you can continue to use Aquaphor or Vaseline on this rash.  You were also seen for pain management of your knees I have placed a referral for hydrotherapy they should give you a call in 1 to 2 weeks.  If you have not heard from them please give the office a call back.  I have also sent in Cymbalta for pain relief please stagger this with your Tylenol arthritis for full pain coverage.  Also you can pick up knee sleeves for some counterpressure and relief.  Follow-up in 1 month for diabetes checkup and follow-up on other issues.  Please call the clinic at [phone] if you have any concerns. It was our pleasure to serve you.

## 2020-04-18 DIAGNOSIS — R600 Localized edema: Secondary | ICD-10-CM | POA: Insufficient documentation

## 2020-04-18 DIAGNOSIS — R21 Rash and other nonspecific skin eruption: Secondary | ICD-10-CM | POA: Insufficient documentation

## 2020-04-18 NOTE — Assessment & Plan Note (Signed)
Recently stopped using Mobic due to not wanting to continue long-term use.  Attempted gabapentin with minimal relief.  Would encourage continuing Tylenol arthritis as prescribed.  Will attempt Cymbalta 60 mg daily.  Also counseled patient on using knee sleeves.  Will refer for aqua therapy with Joelyn Oms.  May consider acupuncture in the future.

## 2020-04-18 NOTE — Assessment & Plan Note (Signed)
Improving. Does not look infected. No surrounding redness. Darker than skin complexion without raise borders. Noticed by daughter and not by patient. No pruritic. Likely contact dermatitis as patient wears depends. Would keep the are clean and dry and if irritated would use vaseline or aquaphor.

## 2020-04-18 NOTE — Assessment & Plan Note (Signed)
Chronically intermittent. Likely dependent edema. Swelling seem most concentrated at ankle joint. Would use compression stockings or Ace wraps. And elevate legs above heart when not standing.  Also encouraged low-sodium diet.  Patient and daughter voiced understanding.  Patient does not have any heart history and has never has an echo that I am aware of. She does not have any symptoms of shortness of breath but could consider obtaining BNP or echo in the future if this continues to be an issue or other symptoms arise.

## 2020-05-16 ENCOUNTER — Ambulatory Visit (INDEPENDENT_AMBULATORY_CARE_PROVIDER_SITE_OTHER): Payer: Self-pay | Admitting: Family Medicine

## 2020-05-16 ENCOUNTER — Other Ambulatory Visit: Payer: Self-pay

## 2020-05-16 ENCOUNTER — Encounter: Payer: Self-pay | Admitting: Family Medicine

## 2020-05-16 VITALS — BP 128/70 | HR 74 | Ht 64.0 in | Wt 240.0 lb

## 2020-05-16 DIAGNOSIS — E1169 Type 2 diabetes mellitus with other specified complication: Secondary | ICD-10-CM

## 2020-05-16 DIAGNOSIS — R7989 Other specified abnormal findings of blood chemistry: Secondary | ICD-10-CM

## 2020-05-16 DIAGNOSIS — E0840 Diabetes mellitus due to underlying condition with diabetic neuropathy, unspecified: Secondary | ICD-10-CM

## 2020-05-16 DIAGNOSIS — I10 Essential (primary) hypertension: Secondary | ICD-10-CM

## 2020-05-16 DIAGNOSIS — Z0000 Encounter for general adult medical examination without abnormal findings: Secondary | ICD-10-CM

## 2020-05-16 DIAGNOSIS — E782 Mixed hyperlipidemia: Secondary | ICD-10-CM

## 2020-05-16 DIAGNOSIS — E0865 Diabetes mellitus due to underlying condition with hyperglycemia: Secondary | ICD-10-CM

## 2020-05-16 DIAGNOSIS — IMO0002 Reserved for concepts with insufficient information to code with codable children: Secondary | ICD-10-CM

## 2020-05-16 LAB — POCT GLYCOSYLATED HEMOGLOBIN (HGB A1C): HbA1c, POC (controlled diabetic range): 6.4 % (ref 0.0–7.0)

## 2020-05-16 MED ORDER — METOPROLOL TARTRATE 100 MG PO TABS: 100.0000 mg | ORAL_TABLET | Freq: Two times a day (BID) | ORAL | 3 refills | Status: DC

## 2020-05-16 NOTE — Progress Notes (Addendum)
° ° °  SUBJECTIVE:   CHIEF COMPLAINT / HPI:   Diabetes Current Regimen: Metformin 1000 mg BID CBGs: Does not monitor  Last A1c: 11/09/2019 on 6.8  Last Eye Exam: 10/09/2018 Statin: Crestor 20 mg (not taking regularly; endorses x2/wk) ACE/ARB: Enalapril 20 mg twice daily  Hypertension: - Medications: chlorthalidone 50 mg daily, enalapril 20 mg BID, metoprolol tartrate BID - Compliance: Endorses compliance - Checking BP at home: Occasionally - LE edema improving from last visit - Denies any SOB, CP, vision changes, medication SEs, or symptoms of hypotension - Diet: Decreases appetite but attempting to eat a balanced diet - Exercise: Increasing walking  Follow up from 04/15/20 Improving lower extremity edema. Knee pain improving with Cymbalta and tylenol. Did not return the call about aquatherapy due to loss of her job.   PERTINENT  PMH / PSH: HLD (fasting today will obtain lipid level), Osteoarthritis of both knees (improving symptoms)  OBJECTIVE:   BP 128/70    Pulse 74    Ht 5\' 4"  (1.626 m)    Wt 240 lb (108.9 kg)    SpO2 98%    BMI 41.20 kg/m   General: Appears well, no acute distress. Age appropriate. Cardiac: RRR, normal heart sounds, no murmurs Respiratory: CTAB, normal effort Extremities: Mild non pitting edema of ankles bilaterally Diabetic Foot Exam - Simple   Simple Foot Form Visual Inspection No deformities, no ulcerations, no other skin breakdown bilaterally: Yes Sensation Testing Intact to touch and monofilament testing bilaterally: Yes Pulse Check Posterior Tibialis and Dorsalis pulse intact bilaterally: Yes Comments    BMP Latest Ref Rng & Units 05/16/2020 01/08/2020 04/24/2019  Glucose 65 - 99 mg/dL 197(H) 105(H) 137(H)  BUN 8 - 27 mg/dL 28(H) 26 13  Creatinine 0.57 - 1.00 mg/dL 1.50(H) 1.19(H) 0.95  BUN/Creat Ratio 12 - 28 19 22 14   Sodium 134 - 144 mmol/L 141 142 143  Potassium 3.5 - 5.2 mmol/L 3.8 4.1 4.2  Chloride 96 - 106 mmol/L 100 105 101  CO2 20 - 29  mmol/L 23 23 23   Calcium 8.7 - 10.3 mg/dL 8.9 9.4 9.6    ASSESSMENT/PLAN:   Diabetes mellitus due to underlying condition, uncontrolled, with diabetic neuropathy, without long-term current use of insulin (HCC) A1c at goal 6.4. Continue with metformin 500mg  BID due to elevated Cr. Follow up in 6 months for repeat A1c consider SGLT2  Primary hypertension BP at goal. Will decrease chlorthalidone to 25 mg daily, enalapril to 10 mg twice daily due to elevated Cr. Continue lopressor 100mg  BID. Follow up in 1 week for BP check  Healthcare maintenance -Referral to GI for colonoscopy placed -DM foot exam performed -BMP w/ worsening Cr and GFR will decrease offending agents as above and repeat BMP in 1 week -Pap scheduled for 05/27/20   Mixed hyperlipidemia due to type 2 diabetes mellitus ([place]) Not well controlled. Likely due to non-compliance. ASCVD 24.6%. Needs compliance with high intensity statin.  -Increase crestor to 40 mg daily -Notify of muscle cramps, can stop for several days and restart to maintain compliance   Gerlene Fee, DO [place]

## 2020-05-16 NOTE — Patient Instructions (Addendum)
It was nice to see you again.  Your hemoglobin A1c looks wonderful keep up the good job.  Continue to eat a balanced diet.  I have referred you for colonoscopy they should give you a call in 1 to 2 weeks.  If you do not hear from them let us know.  Otherwise please get your eye exam.  And schedule your Pap at your earliest convenience.

## 2020-05-17 LAB — BASIC METABOLIC PANEL
BUN/Creatinine Ratio: 19 (ref 12–28)
BUN: 28 mg/dL — ABNORMAL HIGH (ref 8–27)
CO2: 23 mmol/L (ref 20–29)
Calcium: 8.9 mg/dL (ref 8.7–10.3)
Chloride: 100 mmol/L (ref 96–106)
Creatinine, Ser: 1.5 mg/dL — ABNORMAL HIGH (ref 0.57–1.00)
GFR calc Af Amer: 42 mL/min/{1.73_m2} — ABNORMAL LOW (ref >59)
GFR calc non Af Amer: 36 mL/min/{1.73_m2} — ABNORMAL LOW (ref >59)
Glucose: 197 mg/dL — ABNORMAL HIGH (ref 65–99)
Potassium: 3.8 mmol/L (ref 3.5–5.2)
Sodium: 141 mmol/L (ref 134–144)

## 2020-05-17 LAB — LIPID PANEL
Chol/HDL Ratio: 3.7 ratio (ref 0.0–4.4)
Cholesterol, Total: 265 mg/dL — ABNORMAL HIGH (ref 100–199)
HDL: 71 mg/dL (ref >39)
LDL Chol Calc (NIH): 159 mg/dL — ABNORMAL HIGH (ref 0–99)
Triglycerides: 195 mg/dL — ABNORMAL HIGH (ref 0–149)
VLDL Cholesterol Cal: 35 mg/dL (ref 5–40)

## 2020-05-19 ENCOUNTER — Telehealth: Payer: Self-pay | Admitting: Family Medicine

## 2020-05-19 MED ORDER — CHLORTHALIDONE 25 MG PO TABS: 25.0000 mg | ORAL_TABLET | Freq: Every day | ORAL | 3 refills | Status: DC

## 2020-05-19 MED ORDER — METFORMIN HCL ER 500 MG PO TB24: 500.0000 mg | ORAL_TABLET | Freq: Two times a day (BID) | ORAL | 0 refills | Status: DC

## 2020-05-19 MED ORDER — ROSUVASTATIN CALCIUM 40 MG PO TABS: 40.0000 mg | ORAL_TABLET | Freq: Every day | ORAL | 3 refills | Status: DC

## 2020-05-19 MED ORDER — ENALAPRIL MALEATE 10 MG PO TABS: 10.0000 mg | ORAL_TABLET | Freq: Two times a day (BID) | ORAL | 3 refills | Status: DC

## 2020-05-19 NOTE — Assessment & Plan Note (Addendum)
BP at goal. Will decrease chlorthalidone to 25 mg daily, enalapril to 10 mg twice daily due to elevated Cr. Continue lopressor 100mg  BID. Follow up in 1 week for BP check

## 2020-05-19 NOTE — Assessment & Plan Note (Addendum)
Not well controlled. Likely due to non-compliance. ASCVD 24.6%. Needs compliance with high intensity statin.  -Increase crestor to 40 mg daily -Notify of muscle cramps, can stop for several days and restart to maintain compliance

## 2020-05-19 NOTE — Assessment & Plan Note (Addendum)
-  Referral to GI for colonoscopy placed -DM foot exam performed -BMP w/ worsening Cr and GFR will decrease offending agents as above and repeat BMP in 1 week -Pap scheduled for 05/27/20

## 2020-05-19 NOTE — Telephone Encounter (Signed)
Patient notified of results from lipid panel and basic metabolic panel.  Aware of medication changes and nurses visit this Friday for BP check and repeat BMP. She is agreeable to this plan.   Kennie Snedden Autry-Lott, DO 05/19/2020, 11:26 AM PGY-2, [place]

## 2020-05-19 NOTE — Assessment & Plan Note (Addendum)
A1c at goal 6.4. Continue with metformin 500mg  BID due to elevated Cr. Follow up in 6 months for repeat A1c consider SGLT2

## 2020-05-21 ENCOUNTER — Ambulatory Visit: Payer: Self-pay | Admitting: Family Medicine

## 2020-05-23 ENCOUNTER — Other Ambulatory Visit: Payer: Self-pay | Admitting: Family Medicine

## 2020-05-23 ENCOUNTER — Ambulatory Visit: Payer: Self-pay

## 2020-05-23 ENCOUNTER — Other Ambulatory Visit: Payer: Self-pay

## 2020-05-23 DIAGNOSIS — R7989 Other specified abnormal findings of blood chemistry: Secondary | ICD-10-CM

## 2020-05-23 DIAGNOSIS — K219 Gastro-esophageal reflux disease without esophagitis: Secondary | ICD-10-CM

## 2020-05-23 NOTE — Progress Notes (Signed)
Patient here today for BP check.      Last BP was on 05/16/2020 and was 128/70.  BP today is 138/70 with a pulse of 74.    Checked BP in left arm with large cuff.    Patient did not take any BP meds today because she knew she was getting "blood work."  Patient reports she has been compliant with current BP medication regime.  Patient advised to take all medications as prescribed and was sent for blood work.

## 2020-05-24 LAB — BASIC METABOLIC PANEL
BUN/Creatinine Ratio: 17 (ref 12–28)
BUN: 19 mg/dL (ref 8–27)
CO2: 26 mmol/L (ref 20–29)
Calcium: 9.1 mg/dL (ref 8.7–10.3)
Chloride: 97 mmol/L (ref 96–106)
Creatinine, Ser: 1.11 mg/dL — ABNORMAL HIGH (ref 0.57–1.00)
GFR calc Af Amer: 60 mL/min/{1.73_m2} (ref >59)
GFR calc non Af Amer: 52 mL/min/{1.73_m2} — ABNORMAL LOW (ref >59)
Glucose: 196 mg/dL — ABNORMAL HIGH (ref 65–99)
Potassium: 3.9 mmol/L (ref 3.5–5.2)
Sodium: 143 mmol/L (ref 134–144)

## 2020-05-27 ENCOUNTER — Ambulatory Visit: Payer: Self-pay | Admitting: Family Medicine

## 2020-06-04 ENCOUNTER — Ambulatory Visit (INDEPENDENT_AMBULATORY_CARE_PROVIDER_SITE_OTHER): Payer: Self-pay | Admitting: Family Medicine

## 2020-06-04 ENCOUNTER — Encounter: Payer: Self-pay | Admitting: Family Medicine

## 2020-06-04 ENCOUNTER — Other Ambulatory Visit: Payer: Self-pay

## 2020-06-04 ENCOUNTER — Other Ambulatory Visit (HOSPITAL_COMMUNITY)
Admission: RE | Admit: 2020-06-04 | Discharge: 2020-06-04 | Disposition: A | Discharge status: Home / Self Care | Payer: Self-pay | Source: Ambulatory Visit | Attending: Family Medicine | Admitting: Family Medicine

## 2020-06-04 VITALS — BP 102/80 | HR 60 | Wt 238.2 lb

## 2020-06-04 DIAGNOSIS — Z124 Encounter for screening for malignant neoplasm of cervix: Secondary | ICD-10-CM | POA: Insufficient documentation

## 2020-06-04 DIAGNOSIS — Z0000 Encounter for general adult medical examination without abnormal findings: Secondary | ICD-10-CM

## 2020-06-04 DIAGNOSIS — Z01419 Encounter for gynecological examination (general) (routine) without abnormal findings: Secondary | ICD-10-CM

## 2020-06-04 DIAGNOSIS — E2839 Other primary ovarian failure: Secondary | ICD-10-CM

## 2020-06-04 NOTE — Patient Instructions (Addendum)
It was nice to see you again  Today you were seen for a Pap I will call you with abnormal results otherwise I will communicate via MyChart.  We will schedule your bone density scan when you get insurance. This would cost $269 out of pocket.   Please enjoy the rest of your week  Dr. Janus Molder

## 2020-06-04 NOTE — Assessment & Plan Note (Signed)
-  Pap done today -Mammo UTD -Colonoscopy to be scheduled at later date -Optho appt to be made this month -DEXA scan future order placed

## 2020-06-04 NOTE — Progress Notes (Signed)
    SUBJECTIVE:   CHIEF COMPLAINT / HPI:   Pap Smear Last Pap smear 05/20/2015 with NILM.  Prior paps: Normal No LMP recorded. Patient is postmenopausal. Postmenopausal bleeding: No Contraception: Hx of tubal  Sexually Active: No Desire for STD Screening: No  Last mammogram: 10/29/2019, nml Concerns: None  Health maintenance  Plans to call GI office back to schedule colonoscopy after daughter's wedding. Will schedule eye appointment this month. Will hold off on DEXA scan. Recent loss of job and insurance.  PERTINENT  PMH / PSH: HTN, HLD, T2DM, osteoarthritis  OBJECTIVE:   BP 102/80   Pulse 60   Wt 238 lb 3.2 oz (108 kg)   SpO2 98%   BMI 40.89 kg/m   General: Appears well, no acute distress. Age appropriate. Pelvic exam: normal external genitalia, vulva, vagina, cervix, uterus and adnexa, PAP: Pap smear done today, exam chaperoned by Delray Alt, CMA.  ASSESSMENT/PLAN:   Health care maintenance -Pap done today -Mammo UTD -Colonoscopy to be scheduled at later date -Optho appt to be made this month -DEXA scan future order placed    Gerlene Fee, [place]

## 2020-06-06 LAB — CYTOLOGY - PAP
Comment: NEGATIVE
Diagnosis: NEGATIVE
High risk HPV: NEGATIVE

## 2020-07-17 ENCOUNTER — Other Ambulatory Visit: Payer: Self-pay | Admitting: Family Medicine

## 2020-07-21 ENCOUNTER — Other Ambulatory Visit: Payer: Self-pay | Admitting: Family Medicine

## 2020-07-24 ENCOUNTER — Other Ambulatory Visit: Payer: Self-pay | Admitting: Family Medicine

## 2020-07-24 DIAGNOSIS — K219 Gastro-esophageal reflux disease without esophagitis: Secondary | ICD-10-CM

## 2020-08-20 ENCOUNTER — Other Ambulatory Visit: Payer: Self-pay | Admitting: Family Medicine

## 2020-08-20 DIAGNOSIS — E0865 Diabetes mellitus due to underlying condition with hyperglycemia: Secondary | ICD-10-CM

## 2020-08-20 DIAGNOSIS — IMO0002 Reserved for concepts with insufficient information to code with codable children: Secondary | ICD-10-CM

## 2020-09-05 ENCOUNTER — Other Ambulatory Visit: Payer: Self-pay | Admitting: Family Medicine

## 2020-10-20 ENCOUNTER — Ambulatory Visit: Payer: Self-pay | Admitting: Family Medicine

## 2020-10-20 ENCOUNTER — Other Ambulatory Visit: Payer: Self-pay | Admitting: Family Medicine

## 2020-10-20 DIAGNOSIS — K219 Gastro-esophageal reflux disease without esophagitis: Secondary | ICD-10-CM

## 2020-10-27 DIAGNOSIS — H25013 Cortical age-related cataract, bilateral: Secondary | ICD-10-CM | POA: Diagnosis not present

## 2020-10-27 DIAGNOSIS — E119 Type 2 diabetes mellitus without complications: Secondary | ICD-10-CM | POA: Diagnosis not present

## 2020-10-27 DIAGNOSIS — H2513 Age-related nuclear cataract, bilateral: Secondary | ICD-10-CM | POA: Diagnosis not present

## 2020-10-27 DIAGNOSIS — H2589 Other age-related cataract: Secondary | ICD-10-CM | POA: Diagnosis not present

## 2020-10-27 LAB — HM DIABETES EYE EXAM

## 2020-10-30 NOTE — Progress Notes (Signed)
    SUBJECTIVE:   CHIEF COMPLAINT / HPI:   [name] is a 65 yo F who presents for the concerns below.   Sleeping issues States since taking duloxetine she feels as if she has had lack of sleep.  She has enjoyed the pain relief duloxetine gives her and does not want to stop this medication. She also endorses that her trazadone does not help with sleep but does help her relax. Another factor of sleep disturbance is newly increased alcohol intake due to an increased number of special occasions attended. She states she feels as if she only gets 2-3 hours of sound sleep. Her daughter who is present admits she does have poor sleep hygiene habits.   Diabetes Current Regimen: Metformin 500 mg BID CBGs: Does not monitor at home Last A1c: 6.4 on 05/16/20 Denies polyuria, polydipsia, hypoglycemia Last Eye Exam: 10/27/20; bilateral cataracts R>L Statin: Crestor 40 mg daily ACE/ARB: Enalapril 10 mg BID  Need for flu vaccine Plans to get flu vaccine today.   PERTINENT  PMH / PSH: HTN, OA  OBJECTIVE:   BP 102/80   Pulse 75   Ht 5\' 4"  (1.626 m)   Wt 247 lb 3.2 oz (112.1 kg)   SpO2 96%   BMI 42.43 kg/m   General: Appears well, no acute distress. Age appropriate. Cardiac: RRR, normal heart sounds, no murmurs Respiratory: CTAB, normal effort Extremities: No LE edema or cyanosis.  ASSESSMENT/PLAN:   1. Diabetes mellitus due to underlying condition, uncontrolled, with diabetic neuropathy, without long-term current use of insulin (HCC) A1c today  - HgB A1c 8.9 elevated from previous of 6.4. Known recent onset of poor dietary habits such as increase alcohol intake. Discussed this with patient and the need to decrease intake for overall health as well as to improve diabetes management. She agreed will follow up in 3 months with encouraged improvement of dietary habits.   2. Mixed hyperlipidemia due to type 2 diabetes mellitus ([place]) Currently taking crestor. Will obtain d-LDL and suggest  starting Zetia 10 mg daily if >100. - LDL Cholesterol, Direct  3. Need for immunization against influenza - Flu Vaccine QUAD High Dose(Fluad)  4. Poor sleep hygiene - Quality sleep handout given. Discussed details briefly. Patient would like to try prior to medication administration but briefly discussed melatonin as  Well.      Gerlene Fee, [place]

## 2020-10-31 ENCOUNTER — Ambulatory Visit (INDEPENDENT_AMBULATORY_CARE_PROVIDER_SITE_OTHER): Payer: Self-pay | Admitting: Family Medicine

## 2020-10-31 ENCOUNTER — Encounter: Payer: Self-pay | Admitting: Family Medicine

## 2020-10-31 ENCOUNTER — Other Ambulatory Visit: Payer: Self-pay

## 2020-10-31 VITALS — BP 102/80 | HR 75 | Ht 64.0 in | Wt 247.2 lb

## 2020-10-31 DIAGNOSIS — E782 Mixed hyperlipidemia: Secondary | ICD-10-CM

## 2020-10-31 DIAGNOSIS — E0840 Diabetes mellitus due to underlying condition with diabetic neuropathy, unspecified: Secondary | ICD-10-CM

## 2020-10-31 DIAGNOSIS — E1169 Type 2 diabetes mellitus with other specified complication: Secondary | ICD-10-CM

## 2020-10-31 DIAGNOSIS — Z72821 Inadequate sleep hygiene: Secondary | ICD-10-CM

## 2020-10-31 DIAGNOSIS — IMO0002 Reserved for concepts with insufficient information to code with codable children: Secondary | ICD-10-CM

## 2020-10-31 DIAGNOSIS — E0865 Diabetes mellitus due to underlying condition with hyperglycemia: Secondary | ICD-10-CM

## 2020-10-31 DIAGNOSIS — Z23 Encounter for immunization: Secondary | ICD-10-CM

## 2020-10-31 LAB — POCT GLYCOSYLATED HEMOGLOBIN (HGB A1C): Hemoglobin A1C: 8.9 % — AB (ref 4.0–5.6)

## 2020-10-31 NOTE — Patient Instructions (Signed)
It was wonderful to see you today.  Today we talked about:  Sleep hygiene- see handout below.   We discussed decreasing alcohol intake to improve sleep and not to affect controlled diabetes.   I will call you with your lab results if abnormal. If normal I will communicate via mychart.   Thank you for getting your eye exam and flu shot!  Please call the clinic at [phone] if you have any concerns. It was our pleasure to serve you.  Dr. Janus Molder   Quality Sleep Information, Adult Quality sleep is important for your mental and physical health. It also improves your quality of life. Quality sleep means you:  Are asleep for most of the time you are in bed.  Fall asleep within 30 minutes.  Wake up no more than once a night.  Are awake for no longer than 20 minutes if you do wake up during the night. Most adults need 7-8 hours of quality sleep each night. How can poor sleep affect me? If you do not get enough quality sleep, you may have:  Mood swings.  Daytime sleepiness.  Confusion.  Decreased reaction time.  Sleep disorders, such as insomnia and sleep apnea.  Difficulty with: ? Solving problems. ? Coping with stress. ? Paying attention. These issues may affect your performance and productivity at work, school, and at home. Lack of sleep may also put you at higher risk for accidents, suicide, and risky behaviors. If you do not get quality sleep you may also be at higher risk for several health problems, including:  Infections.  Type 2 diabetes.  Heart disease.  High blood pressure.  Obesity.  Worsening of long-term conditions, like arthritis, kidney disease, depression, Parkinson's disease, and epilepsy. What actions can I take to get more quality sleep?  Stick to a sleep schedule. Go to sleep and wake up at about the same time each day. Do not try to sleep less on weekdays and make up for lost sleep on weekends. This does not work.  Try to get about  30 minutes of exercise on most days. Do not exercise 2-3 hours before going to bed.  Limit naps during the day to 30 minutes or less.  Do not use any products that contain nicotine or tobacco, such as cigarettes or e-cigarettes. If you need help quitting, ask your health care provider.  Do not drink caffeinated beverages for at least 8 hours before going to bed. Coffee, tea, and some sodas contain caffeine.  Do not drink alcohol close to bedtime.  Do not eat large meals close to bedtime.  Do not take naps in the late afternoon.  Try to get at least 30 minutes of sunlight every day. Morning sunlight is best.  Make time to relax before bed. Reading, listening to music, or taking a hot bath promotes quality sleep.  Make your bedroom a place that promotes quality sleep. Keep your bedroom dark, quiet, and at a comfortable room temperature. Make sure your bed is comfortable. Take out sleep distractions like TV, a computer, smartphone, and bright lights.  If you are lying awake in bed for longer than 20 minutes, get up and do a relaxing activity until you feel sleepy.  Work with your health care provider to treat medical conditions that may affect sleeping, such as: ? Nasal obstruction. ? Snoring. ? Sleep apnea and other sleep disorders.  Talk to your health care provider if you think any of your prescription medicines may cause you to have  difficulty falling or staying asleep.  If you have sleep problems, talk with a sleep consultant. If you think you have a sleep disorder, talk with your health care provider about getting evaluated by a specialist.      Where to find more information  [place] website: https://sleepfoundation.org  National Heart, Lung, and [place] ([place]): http://www.saunders.info/.pdf  Centers for Disease Control and Prevention (CDC): LearningDermatology.pl Contact a health care provider if you:  Have  trouble getting to sleep or staying asleep.  Often wake up very early in the morning and cannot get back to sleep.  Have daytime sleepiness.  Have daytime sleep attacks of suddenly falling asleep and sudden muscle weakness (narcolepsy).  Have a tingling sensation in your legs with a strong urge to move your legs (restless legs syndrome).  Stop breathing briefly during sleep (sleep apnea).  Think you have a sleep disorder or are taking a medicine that is affecting your quality of sleep. Summary  Most adults need 7-8 hours of quality sleep each night.  Getting enough quality sleep is an important part of health and well-being.  Make your bedroom a place that promotes quality sleep and avoid things that may cause you to have poor sleep, such as alcohol, caffeine, smoking, and large meals.  Talk to your health care provider if you have trouble falling asleep or staying asleep. This information is not intended to replace advice given to you by your health care provider. Make sure you discuss any questions you have with your health care provider. Document Revised: 12/28/2017 Document Reviewed: 12/28/2017 Elsevier Patient Education  2021 [employer].

## 2020-11-01 LAB — LDL CHOLESTEROL, DIRECT: LDL Direct: 68 mg/dL (ref 0–99)

## 2020-11-03 DIAGNOSIS — H25013 Cortical age-related cataract, bilateral: Secondary | ICD-10-CM | POA: Diagnosis not present

## 2020-11-03 DIAGNOSIS — H2589 Other age-related cataract: Secondary | ICD-10-CM | POA: Diagnosis not present

## 2020-11-03 DIAGNOSIS — H2511 Age-related nuclear cataract, right eye: Secondary | ICD-10-CM | POA: Diagnosis not present

## 2020-11-03 DIAGNOSIS — H2513 Age-related nuclear cataract, bilateral: Secondary | ICD-10-CM | POA: Diagnosis not present

## 2020-11-15 ENCOUNTER — Other Ambulatory Visit: Payer: Self-pay | Admitting: Family Medicine

## 2020-11-15 DIAGNOSIS — E0840 Diabetes mellitus due to underlying condition with diabetic neuropathy, unspecified: Secondary | ICD-10-CM

## 2020-11-15 DIAGNOSIS — E0865 Diabetes mellitus due to underlying condition with hyperglycemia: Secondary | ICD-10-CM

## 2020-11-15 DIAGNOSIS — IMO0002 Reserved for concepts with insufficient information to code with codable children: Secondary | ICD-10-CM

## 2020-12-10 ENCOUNTER — Telehealth: Payer: Self-pay

## 2020-12-10 ENCOUNTER — Other Ambulatory Visit: Payer: Self-pay

## 2020-12-10 ENCOUNTER — Ambulatory Visit (INDEPENDENT_AMBULATORY_CARE_PROVIDER_SITE_OTHER): Payer: Medicare HMO | Admitting: Family Medicine

## 2020-12-10 VITALS — BP 130/74 | HR 76 | Ht 64.0 in | Wt 246.4 lb

## 2020-12-10 DIAGNOSIS — E0865 Diabetes mellitus due to underlying condition with hyperglycemia: Secondary | ICD-10-CM | POA: Diagnosis not present

## 2020-12-10 DIAGNOSIS — I10 Essential (primary) hypertension: Secondary | ICD-10-CM | POA: Diagnosis not present

## 2020-12-10 DIAGNOSIS — E0840 Diabetes mellitus due to underlying condition with diabetic neuropathy, unspecified: Secondary | ICD-10-CM

## 2020-12-10 DIAGNOSIS — IMO0002 Reserved for concepts with insufficient information to code with codable children: Secondary | ICD-10-CM

## 2020-12-10 LAB — GLUCOSE, POCT (MANUAL RESULT ENTRY): POC Glucose: 400 mg/dl — AB (ref 70–99)

## 2020-12-10 MED ORDER — METFORMIN HCL ER 500 MG PO TB24: 1000.0000 mg | ORAL_TABLET | Freq: Every day | ORAL | 0 refills | Status: DC

## 2020-12-10 NOTE — Telephone Encounter (Signed)
Patient's daughter calls nurse line regarding elevated blood sugar levels. Daughter states that patient was supposed to have cataract procedure today, however, procedure was postponed due to elevated fasting blood sugar at 467. Patient is currently asymptomatic. Patient takes metformin for diabetic control as prescribed. Precepted with Dr. Owens Shark. Advised that patient could be seen in our office as she is asymptomatic. Scheduled patient for 9:30 this morning with Dr. Susa Simmonds.   Talbot Grumbling, RN

## 2020-12-10 NOTE — Progress Notes (Signed)
    SUBJECTIVE:   CHIEF COMPLAINT / HPI:   DM: Patient states she was going to have cataract surgery this morning and her blood sugar was found to be over 400.  She was sent here in the procedure was postponed until the 29th.  Her blood sugars had been well controlled up until her last 1 in January which was 8.9.  At that time no medications were changed, but she was told to make dietary changes.  She is prescribed 500 mg Metformin XR twice a day, but only takes it once a day due to only eating 1 meal per day.  She only eats 1 meal because she spends most of the day in bed due to insomnia secondary to her duloxetine use for leg pain.  She is intolerant of gabapentin.  She states now she only takes one of the 500 Metformin pills when she eats dinner.  Last night she also snack in a candy bar from the vending machine at around midnight.  Patient states she has been having increased thirst and urination over the past few days, but no nausea/vomiting/diarrhea.  No abdominal pain  She states normally she can "feel" if her blood sugar is high but today she did not feel anything unusual.  Her blood sugar for the procedure has to be under 400 before they will do the surgery.  PERTINENT  PMH / PSH: Diabetes  OBJECTIVE:   BP 130/74   Pulse 76   Ht 5\' 4"  (1.626 m)   Wt 246 lb 6.4 oz (111.8 kg)   SpO2 94%   BMI 42.29 kg/m   General: Alert and oriented.  No acute distress.  Accompanied by daughter. CV: Regular rate and rhythm, no murmurs Pulmonary: Lungs good auscultation bilaterally Psych: Pleasant affect.  ASSESSMENT/PLAN:   Diabetes mellitus due to underlying condition, uncontrolled, with diabetic neuropathy, without long-term current use of insulin (HCC) Poorly controlled diabetes due to poor dietary habits, incomplete compliance with her Metformin.  Will have patient take 1000 mg at night with her largest meal.  We will have her follow-up in 1 week with me and we can adjust medications further at  that time.  Also advised patient to eat more frequent smaller meals daily and avoid snacking after dinner.  Gave patient information for Iver Nestle, our nutritionist, and advised her to get in touch with her.  Getting BMP today to check for signs of acidosis.  At her next visit we will very likely increase her Metformin again and keep it at 1 time per day given that she is on the extended release formulation.  Can consider switching her duloxetine to a different medication if this is contributing to her poor compliance with her medications.     Benay Pike, MD [place]

## 2020-12-10 NOTE — Patient Instructions (Signed)
It was nice to meet you today,  We made the following changes today: -Please take 1000 mg of Metformin with your largest meal of the day. -Please schedule a follow-up appointment with me for 1 week from now so that we can review more changes to your medication. -Please try to at least eat two meals a day to avoid eating too much at once and to avoid late night snacks. -I have given you the information for our nutritionist, Iver Nestle.  Please call her to discuss proper nutrition for diabetes -Do not eat any midnight snacks or anything after dinner on the night prior to your next surgery.  Have a great day,  Clemetine Marker, MD

## 2020-12-10 NOTE — Assessment & Plan Note (Addendum)
Poorly controlled diabetes due to poor dietary habits, incomplete compliance with her Metformin.  Will have patient take 1000 mg at night with her largest meal.  We will have her follow-up in 1 week with me and we can adjust medications further at that time.  Also advised patient to eat more frequent smaller meals daily and avoid snacking after dinner.  Gave patient information for Iver Nestle, our nutritionist, and advised her to get in touch with her.  Getting BMP today to check for signs of acidosis.  At her next visit we will very likely increase her Metformin again and keep it at 1 time per day given that she is on the extended release formulation.  Can consider switching her duloxetine to a different medication if this is contributing to her poor compliance with her medications.

## 2020-12-11 LAB — BASIC METABOLIC PANEL
BUN/Creatinine Ratio: 21 (ref 12–28)
BUN: 25 mg/dL (ref 8–27)
CO2: 22 mmol/L (ref 20–29)
Calcium: 8.4 mg/dL — ABNORMAL LOW (ref 8.7–10.3)
Chloride: 99 mmol/L (ref 96–106)
Creatinine, Ser: 1.2 mg/dL — ABNORMAL HIGH (ref 0.57–1.00)
Glucose: 403 mg/dL — ABNORMAL HIGH (ref 65–99)
Potassium: 3.8 mmol/L (ref 3.5–5.2)
Sodium: 140 mmol/L (ref 134–144)
eGFR: 50 mL/min/{1.73_m2} — ABNORMAL LOW (ref >59)

## 2020-12-18 NOTE — Progress Notes (Signed)
    SUBJECTIVE:   CHIEF COMPLAINT / HPI:   DM: Patient is taking 500 mg of Metformin with breakfast and again at dinner.  She checks her blood sugars regulate home.  Lowest was 187, the highest was 583.  Patient is okay with taking a once a week injectable.  She has never taken insulin or other injectables before.  Patient's eye surgeries next Wednesday.  PERTINENT  PMH / PSH: DM  OBJECTIVE:   BP 138/72   Pulse 75   Ht 5\' 4"  (1.626 m)   Wt 245 lb 9.6 oz (111.4 kg)   SpO2 93%   BMI 42.16 kg/m   General: Alert, oriented.  No acute distress.  Obese.  Company by daughter. CV,: Regular rate and rhythm, no murmurs Pulmonary: Lungs clear to auscultation bilaterally, no wheezes or crackles.  ASSESSMENT/PLAN:   Diabetes mellitus due to underlying condition, uncontrolled, with diabetic neuropathy, without long-term current use of insulin ([place]) Home blood sugar readings still elevated. -Increase to 1000 g twice daily Metformin -Start taking Ozempic once weekly -Follow-up in 1 month     Benay Pike, MD [place]

## 2020-12-19 ENCOUNTER — Other Ambulatory Visit: Payer: Self-pay

## 2020-12-19 ENCOUNTER — Encounter: Payer: Self-pay | Admitting: Family Medicine

## 2020-12-19 ENCOUNTER — Ambulatory Visit (INDEPENDENT_AMBULATORY_CARE_PROVIDER_SITE_OTHER): Payer: Medicare HMO | Admitting: Family Medicine

## 2020-12-19 DIAGNOSIS — Z6841 Body Mass Index (BMI) 40.0 and over, adult: Secondary | ICD-10-CM | POA: Diagnosis not present

## 2020-12-19 DIAGNOSIS — E0865 Diabetes mellitus due to underlying condition with hyperglycemia: Secondary | ICD-10-CM

## 2020-12-19 DIAGNOSIS — E0840 Diabetes mellitus due to underlying condition with diabetic neuropathy, unspecified: Secondary | ICD-10-CM | POA: Diagnosis not present

## 2020-12-19 DIAGNOSIS — E1140 Type 2 diabetes mellitus with diabetic neuropathy, unspecified: Secondary | ICD-10-CM | POA: Diagnosis not present

## 2020-12-19 DIAGNOSIS — IMO0002 Reserved for concepts with insufficient information to code with codable children: Secondary | ICD-10-CM

## 2020-12-19 MED ORDER — METFORMIN HCL ER 500 MG PO TB24: 1000.0000 mg | ORAL_TABLET | Freq: Two times a day (BID) | ORAL | 0 refills | Status: DC

## 2020-12-19 MED ORDER — OZEMPIC (0.25 OR 0.5 MG/DOSE) 2 MG/1.5ML ~~LOC~~ SOPN: 0.5000 mg | PEN_INJECTOR | SUBCUTANEOUS | 4 refills | Status: DC

## 2020-12-19 NOTE — Patient Instructions (Signed)
It was nice to see you again today,    I would like you to start taking 2 pills of your Metformin twice a day.  I have also prescribed you a medicine called semaglutide which is the generic for Ozempic.  Inject this and to your abdomen once a week.  I would like you to follow-up in 1 month so we can recheck your blood sugar and adjust the medication if necessary.  I would like you to continue to write down all your blood sugar readings and bring them in next time you see [place].  Have a great day,  Clemetine Marker, MD

## 2020-12-21 NOTE — Assessment & Plan Note (Signed)
Home blood sugar readings still elevated. -Increase to 1000 g twice daily Metformin -Start taking Ozempic once weekly -Follow-up in 1 month

## 2020-12-24 ENCOUNTER — Other Ambulatory Visit: Payer: Self-pay | Admitting: Family Medicine

## 2020-12-24 DIAGNOSIS — H2511 Age-related nuclear cataract, right eye: Secondary | ICD-10-CM | POA: Diagnosis not present

## 2020-12-24 DIAGNOSIS — H25811 Combined forms of age-related cataract, right eye: Secondary | ICD-10-CM | POA: Diagnosis not present

## 2020-12-30 DIAGNOSIS — H2512 Age-related nuclear cataract, left eye: Secondary | ICD-10-CM | POA: Diagnosis not present

## 2020-12-30 DIAGNOSIS — H25012 Cortical age-related cataract, left eye: Secondary | ICD-10-CM | POA: Diagnosis not present

## 2021-01-05 ENCOUNTER — Other Ambulatory Visit: Payer: Self-pay | Admitting: Family Medicine

## 2021-01-05 DIAGNOSIS — E0865 Diabetes mellitus due to underlying condition with hyperglycemia: Secondary | ICD-10-CM

## 2021-01-05 DIAGNOSIS — I10 Essential (primary) hypertension: Secondary | ICD-10-CM

## 2021-01-05 DIAGNOSIS — IMO0002 Reserved for concepts with insufficient information to code with codable children: Secondary | ICD-10-CM

## 2021-01-20 ENCOUNTER — Telehealth: Payer: Self-pay

## 2021-01-20 NOTE — Telephone Encounter (Signed)
Patient calls nurse line regarding medication management of ozempic. Patient is out of town currently and forgot to take Ozepmic on normal day (Tuesday). Patient will be back in town on Thursday. Patient has questions about when she should resume ozempic and when she should administer future doses  due to delayed dose.   Spoke with Dr. Jeannine Kitten regarding question, as he has been providing most recent DM management. Per Dr. Jeannine Kitten, patient can proceed with dose on Thursday, with next dose either Tuesday or Wednesday of next week. Instructed patient of above and if she decides to administer on Wednesday of next week, she would administer next weekly dose on Tuesday to get back on schedule.   Patient verbalizes understanding and is appreciative.   Talbot Grumbling, RN

## 2021-01-21 DIAGNOSIS — H25012 Cortical age-related cataract, left eye: Secondary | ICD-10-CM | POA: Diagnosis not present

## 2021-01-21 DIAGNOSIS — H25812 Combined forms of age-related cataract, left eye: Secondary | ICD-10-CM | POA: Diagnosis not present

## 2021-01-21 DIAGNOSIS — H2512 Age-related nuclear cataract, left eye: Secondary | ICD-10-CM | POA: Diagnosis not present

## 2021-02-17 ENCOUNTER — Other Ambulatory Visit: Payer: Self-pay | Admitting: Family Medicine

## 2021-02-17 DIAGNOSIS — IMO0002 Reserved for concepts with insufficient information to code with codable children: Secondary | ICD-10-CM

## 2021-02-17 DIAGNOSIS — E0865 Diabetes mellitus due to underlying condition with hyperglycemia: Secondary | ICD-10-CM

## 2021-02-17 DIAGNOSIS — E0840 Diabetes mellitus due to underlying condition with diabetic neuropathy, unspecified: Secondary | ICD-10-CM

## 2021-02-18 ENCOUNTER — Telehealth: Payer: Self-pay | Admitting: *Deleted

## 2021-02-18 DIAGNOSIS — IMO0002 Reserved for concepts with insufficient information to code with codable children: Secondary | ICD-10-CM

## 2021-02-18 DIAGNOSIS — E0865 Diabetes mellitus due to underlying condition with hyperglycemia: Secondary | ICD-10-CM

## 2021-02-18 DIAGNOSIS — M79674 Pain in right toe(s): Secondary | ICD-10-CM

## 2021-02-18 NOTE — Telephone Encounter (Signed)
Patient called to get an updated referral for her podiatrist.  Patient has been seeing [facility] but her insurance said she needs a new referral.  She sees them to get her toenails clipped and also the toenail on the big toe is digging into her skin, she said.  Will pend referral in this encounter and send to provider.  Patient didn't think she could wait until next month to see pcp to ask for this.  Ajmal Kathan,CMA

## 2021-02-19 NOTE — Telephone Encounter (Signed)
Referral signed. Thank you for your help with this patient.   [place], DO 02/19/2021, 3:36 PM PGY-2, [place]

## 2021-02-23 ENCOUNTER — Other Ambulatory Visit: Payer: Self-pay

## 2021-02-23 ENCOUNTER — Ambulatory Visit: Payer: Self-pay | Admitting: Podiatry

## 2021-02-23 ENCOUNTER — Encounter: Payer: Self-pay | Admitting: Podiatry

## 2021-02-23 ENCOUNTER — Ambulatory Visit: Payer: Medicare HMO | Admitting: Podiatry

## 2021-02-23 DIAGNOSIS — M79674 Pain in right toe(s): Secondary | ICD-10-CM

## 2021-02-23 DIAGNOSIS — B351 Tinea unguium: Secondary | ICD-10-CM

## 2021-02-23 DIAGNOSIS — L600 Ingrowing nail: Secondary | ICD-10-CM

## 2021-02-23 DIAGNOSIS — E1140 Type 2 diabetes mellitus with diabetic neuropathy, unspecified: Secondary | ICD-10-CM | POA: Diagnosis not present

## 2021-02-23 DIAGNOSIS — E1149 Type 2 diabetes mellitus with other diabetic neurological complication: Secondary | ICD-10-CM

## 2021-02-23 DIAGNOSIS — M79675 Pain in left toe(s): Secondary | ICD-10-CM | POA: Diagnosis not present

## 2021-02-23 NOTE — Progress Notes (Signed)
Subjective:   Patient ID: [name], female   DOB: 65 y.o.   MRN: [id number]   HPI Patient presents concerned about generalized thickness of her nailbeds that become aggravating and then a very painful incurvated hallux nail left medial lateral border and also the beginning of burning pain in her feet regarding her sugar has been much higher over the last few months and she is working to bring it down   ROS      Objective:  Physical Exam  Neurovascular status intact with thick incurvated painful left hallux nail that becomes dystrophic and she would like long-term to have removal of all other nails being thickened and moderately discomforting with long-term diabetes A1c over 8 and running high sugars recently that she is working on     Assessment:  Ingrown toenail deformity left hallux with structural changes of the bed along with moderate neuropathic symptomatology and mycotic nail infection 1-5 both feet     Plan:  H&P reviewed I would like to remove this nail permanently for her but until her sugar comes under better control we cannot do that and I reviewed different ways for her to continue to work on that.  I also discussed the continuation of trimming and I debrided nailbeds 1-5 both feet today to try to take the pressure and hopefully resolve some of the discomfort left hallux until we can remove the nail which I educated her on also discussed neuropathy and the importance of control

## 2021-02-23 NOTE — Patient Instructions (Signed)

## 2021-03-04 ENCOUNTER — Other Ambulatory Visit: Payer: Self-pay | Admitting: Family Medicine

## 2021-03-04 DIAGNOSIS — K219 Gastro-esophageal reflux disease without esophagitis: Secondary | ICD-10-CM

## 2021-03-04 DIAGNOSIS — E1169 Type 2 diabetes mellitus with other specified complication: Secondary | ICD-10-CM

## 2021-03-04 DIAGNOSIS — IMO0002 Reserved for concepts with insufficient information to code with codable children: Secondary | ICD-10-CM

## 2021-03-04 DIAGNOSIS — E0865 Diabetes mellitus due to underlying condition with hyperglycemia: Secondary | ICD-10-CM

## 2021-03-04 DIAGNOSIS — I10 Essential (primary) hypertension: Secondary | ICD-10-CM

## 2021-03-04 DIAGNOSIS — E0840 Diabetes mellitus due to underlying condition with diabetic neuropathy, unspecified: Secondary | ICD-10-CM

## 2021-03-04 DIAGNOSIS — E782 Mixed hyperlipidemia: Secondary | ICD-10-CM

## 2021-03-16 ENCOUNTER — Ambulatory Visit (INDEPENDENT_AMBULATORY_CARE_PROVIDER_SITE_OTHER): Payer: Medicare HMO | Admitting: Family Medicine

## 2021-03-16 ENCOUNTER — Other Ambulatory Visit: Payer: Self-pay

## 2021-03-16 VITALS — BP 124/75 | HR 91 | Ht 63.0 in | Wt 232.6 lb

## 2021-03-16 DIAGNOSIS — R2242 Localized swelling, mass and lump, left lower limb: Secondary | ICD-10-CM

## 2021-03-16 DIAGNOSIS — Z23 Encounter for immunization: Secondary | ICD-10-CM | POA: Diagnosis not present

## 2021-03-16 DIAGNOSIS — E0840 Diabetes mellitus due to underlying condition with diabetic neuropathy, unspecified: Secondary | ICD-10-CM

## 2021-03-16 DIAGNOSIS — IMO0002 Reserved for concepts with insufficient information to code with codable children: Secondary | ICD-10-CM

## 2021-03-16 DIAGNOSIS — E0865 Diabetes mellitus due to underlying condition with hyperglycemia: Secondary | ICD-10-CM

## 2021-03-16 DIAGNOSIS — Z6841 Body Mass Index (BMI) 40.0 and over, adult: Secondary | ICD-10-CM | POA: Diagnosis not present

## 2021-03-16 LAB — POCT GLYCOSYLATED HEMOGLOBIN (HGB A1C): HbA1c, POC (controlled diabetic range): 7 % (ref 0.0–7.0)

## 2021-03-16 NOTE — Patient Instructions (Addendum)
It was wonderful to see you today.  Please bring ALL of your medications with you to every visit.   Today we talked about:  Continue current diabetes medications.  Good job on getting your COVID booster today.  Good job on getting your pneumococcal vaccine today.  Please be sure to schedule follow up at the front  desk before you leave today.   Please call the clinic at [phone] if your symptoms worsen or you have any concerns. It was our pleasure to serve you.  Dr. Janus Molder

## 2021-03-16 NOTE — Progress Notes (Signed)
    SUBJECTIVE:   CHIEF COMPLAINT / HPI:   [name] is a 65 yo F who presents for the following:  Diabetes Current Regimen: Metformin 500 mg BID, ozempic 0.5 mg weekly (Tuesday) CBGs: 159-400s  Last A1c: 8.9 on 10/31/2020  Denies polyuria, polydipsia, hypoglycemia Last Eye Exam: 10/27/2020 Statin: Rosuvastatin 40 m g daily ACE/ARB: Enalapril 10mg  BID  Knot under skin Knot found under skin of left lower leg. Has been there for months and is decreasing in size. Is not painful or bother patient at this time.   PERTINENT  PMH / PSH: HTN, HLD, Hx of lipoma  OBJECTIVE:   BP 124/75   Pulse 91   Ht 5\' 3"  (1.6 m)   Wt 232 lb 9.6 oz (105.5 kg)   SpO2 96%   BMI 41.20 kg/m   General: Appears well, no acute distress. Age appropriate. Cardiac: RRR, normal heart sounds, no murmurs Extremities: No overlying skin changes. No visible to naked eye. LLE with 1cm mobile fluctuant nodule under skin over tibia. Not TTP.   ASSESSMENT/PLAN:   Diabetes mellitus due to underlying condition, uncontrolled, with diabetic neuropathy, without long-term current use of insulin (HCC) Hgb A1c 7.0 today. 13 lbs wt. Loss. Metformin recently increased and ozempic added at last visit.  -Continue current medications -BMP -Follow up in 3 months (Obtain lipid panel; consider Vascepa 2 g BID if triglycerides >150)  Nodule of skin of left lower leg Chronic. Decreasing in size. Non tender. Mobile. Patient with hx of lipoma could be benign fat deposit. Also consider calcium deposit. Low suspicion for foreign body or cancer. Discussed watchful waiting and will revisit if any changes.    [name], [place]

## 2021-03-17 ENCOUNTER — Encounter: Payer: Self-pay | Admitting: Family Medicine

## 2021-03-17 ENCOUNTER — Ambulatory Visit (INDEPENDENT_AMBULATORY_CARE_PROVIDER_SITE_OTHER): Payer: Medicare HMO

## 2021-03-17 DIAGNOSIS — R2242 Localized swelling, mass and lump, left lower limb: Secondary | ICD-10-CM | POA: Insufficient documentation

## 2021-03-17 DIAGNOSIS — Z23 Encounter for immunization: Secondary | ICD-10-CM

## 2021-03-17 LAB — BASIC METABOLIC PANEL
BUN/Creatinine Ratio: 17 (ref 12–28)
BUN: 20 mg/dL (ref 8–27)
CO2: 26 mmol/L (ref 20–29)
Calcium: 8.9 mg/dL (ref 8.7–10.3)
Chloride: 100 mmol/L (ref 96–106)
Creatinine, Ser: 1.17 mg/dL — ABNORMAL HIGH (ref 0.57–1.00)
Glucose: 133 mg/dL — ABNORMAL HIGH (ref 65–99)
Potassium: 3.5 mmol/L (ref 3.5–5.2)
Sodium: 142 mmol/L (ref 134–144)
eGFR: 52 mL/min/{1.73_m2} — ABNORMAL LOW (ref >59)

## 2021-03-17 NOTE — Assessment & Plan Note (Signed)
Chronic. Decreasing in size. Non tender. Mobile. Patient with hx of lipoma could be benign fat deposit. Also consider calcium deposit. Low suspicion for foreign body or cancer. Discussed watchful waiting and will revisit if any changes.

## 2021-03-17 NOTE — Assessment & Plan Note (Signed)
Hgb A1c 7.0 today. 13 lbs wt. Loss. Metformin recently increased and ozempic added at last visit.  -Continue current medications -BMP -Follow up in 3 months (Obtain lipid panel; consider Vascepa 2 g BID if triglycerides >150)

## 2021-04-14 ENCOUNTER — Encounter: Payer: Self-pay | Admitting: Family Medicine

## 2021-05-18 ENCOUNTER — Other Ambulatory Visit: Payer: Self-pay | Admitting: Family Medicine

## 2021-05-26 ENCOUNTER — Other Ambulatory Visit: Payer: Self-pay | Admitting: Family Medicine

## 2021-05-26 DIAGNOSIS — I10 Essential (primary) hypertension: Secondary | ICD-10-CM

## 2021-05-26 DIAGNOSIS — IMO0002 Reserved for concepts with insufficient information to code with codable children: Secondary | ICD-10-CM

## 2021-05-26 DIAGNOSIS — E0865 Diabetes mellitus due to underlying condition with hyperglycemia: Secondary | ICD-10-CM

## 2021-05-26 DIAGNOSIS — E782 Mixed hyperlipidemia: Secondary | ICD-10-CM

## 2021-05-26 DIAGNOSIS — E1169 Type 2 diabetes mellitus with other specified complication: Secondary | ICD-10-CM

## 2021-05-26 DIAGNOSIS — K219 Gastro-esophageal reflux disease without esophagitis: Secondary | ICD-10-CM

## 2021-05-28 ENCOUNTER — Other Ambulatory Visit: Payer: Self-pay | Admitting: Family Medicine

## 2021-05-29 NOTE — Progress Notes (Signed)
Submitted application for OZEMPIC '2MG'$ /1.5ML to [place] for patient assistance.   Phone: [phone]

## 2021-06-03 NOTE — Progress Notes (Signed)
Received notification from [place] regarding approval for OZEMPIC '2MG'$ /1.5ML. Patient assistance approved from 06/02/21 to 09/02/21.  Medication will ship to office - 4 mnth supply is processing  Phone: [phone]

## 2021-07-05 NOTE — Progress Notes (Signed)
      SUBJECTIVE:   CHIEF COMPLAINT / HPI:   [name] is a 65 yo F who presents for the below.   Diabetes Current Regimen: Metformin 500 mg BID, ozempic 0.5 mg weekly (Tuesday) CBGs: 180s, checks occasionally  Last A1c: 8.9 on 10/31/2020  Denies polyuria, polydipsia, hypoglycemia Last Eye Exam: 10/27/2020 Statin: Rosuvastatin 40 m g daily ACE/ARB: Enalapril 10mg  BID  Toe problem Left podagra pain, states she was told she has a fungus. Has seen podiatrist. Wants her to get her A1c down before he does anything. She endorses continue epsom salt soaks.   HM Desires flu shot today.  Needs shingles vaccine Needs colonoscopy  PERTINENT  PMH / PSH: HTN, HLD, OA, cataracts  OBJECTIVE:   BP 137/65   Pulse 77   Wt 237 lb 12.8 oz (107.9 kg)   SpO2 100%   BMI 42.12 kg/m   Physical Exam Media Information Document Information  Photos    07/06/2021 10:39  Attached To:  Office Visit on 07/06/21 with [name], Cheyenne Plummer, DO   Source Information  [name], Cheyenne Plummer, DO  [facility] Res[facility]    ASSESSMENT/PLAN:   1. Diabetes mellitus without complication (HCC) Improving 6.7 today. Continue current regimen. F/u in 3 months for repeat A1c.   2. Onychomycosis Right podagra. Reviewed podiatry note. No specific A1c for toe removal procedure. Encouraged patient to reach out to podiatrist to inform of new A1c to complete toe removal.   3. Need for vaccination - Zoster Vaccine Adjuvanted [name]) injection; Inject 0.5 mLs into the muscle once for 1 dose.  Dispense: 0.5 mL; Refill: 0  4. Elevated serum creatinine Downtrending. Will monitor - Basic Metabolic Panel  5. Colon cancer screening - Ambulatory referral to Gastroenterology  6. Need for immunization against influenza - Flu Vaccine QUAD 36mo+IM (Fluarix, Fluzone & Alfiuria Quad PF)   [name], [name]

## 2021-07-06 ENCOUNTER — Ambulatory Visit (INDEPENDENT_AMBULATORY_CARE_PROVIDER_SITE_OTHER): Payer: Medicare HMO | Admitting: Family Medicine

## 2021-07-06 ENCOUNTER — Encounter: Payer: Self-pay | Admitting: Family Medicine

## 2021-07-06 ENCOUNTER — Other Ambulatory Visit: Payer: Self-pay

## 2021-07-06 VITALS — BP 137/65 | HR 77 | Wt 237.8 lb

## 2021-07-06 DIAGNOSIS — Z1211 Encounter for screening for malignant neoplasm of colon: Secondary | ICD-10-CM | POA: Diagnosis not present

## 2021-07-06 DIAGNOSIS — E119 Type 2 diabetes mellitus without complications: Secondary | ICD-10-CM

## 2021-07-06 DIAGNOSIS — Z23 Encounter for immunization: Secondary | ICD-10-CM

## 2021-07-06 DIAGNOSIS — R7989 Other specified abnormal findings of blood chemistry: Secondary | ICD-10-CM

## 2021-07-06 DIAGNOSIS — B351 Tinea unguium: Secondary | ICD-10-CM

## 2021-07-06 LAB — POCT GLYCOSYLATED HEMOGLOBIN (HGB A1C): HbA1c, POC (controlled diabetic range): 6.7 % (ref 0.0–7.0)

## 2021-07-06 MED ORDER — SHINGRIX 50 MCG/0.5ML IM SUSR: 0.5000 mL | Freq: Once | INTRAMUSCULAR | 0 refills | Status: AC

## 2021-07-06 NOTE — Patient Instructions (Signed)
-  Today we discussed your diabetes which her A1c is 6.7 good job!  We will not change your medications continue current dose.  I will check blood work to check on your kidney function. -We also discussed your toe pain in which you have seen podiatry for.  Plan is to schedule appointment with them for toenail removal. -Thank you for getting your flu shot.  Take prescription to pharmacy for your shingles shot. -We have referred you for a colonoscopy please get this scheduled. Follow-up with me in 3 months.  Dr. Janus Molder

## 2021-07-07 LAB — BASIC METABOLIC PANEL
BUN/Creatinine Ratio: 18 (ref 12–28)
BUN: 23 mg/dL (ref 8–27)
CO2: 24 mmol/L (ref 20–29)
Calcium: 8.8 mg/dL (ref 8.7–10.3)
Chloride: 102 mmol/L (ref 96–106)
Creatinine, Ser: 1.27 mg/dL — ABNORMAL HIGH (ref 0.57–1.00)
Glucose: 147 mg/dL — ABNORMAL HIGH (ref 70–99)
Potassium: 3.6 mmol/L (ref 3.5–5.2)
Sodium: 143 mmol/L (ref 134–144)
eGFR: 47 mL/min/{1.73_m2} — ABNORMAL LOW (ref >59)

## 2021-08-19 DIAGNOSIS — E119 Type 2 diabetes mellitus without complications: Secondary | ICD-10-CM | POA: Diagnosis not present

## 2021-08-19 DIAGNOSIS — Z961 Presence of intraocular lens: Secondary | ICD-10-CM | POA: Diagnosis not present

## 2021-08-19 DIAGNOSIS — H26493 Other secondary cataract, bilateral: Secondary | ICD-10-CM | POA: Diagnosis not present

## 2021-08-19 DIAGNOSIS — H43813 Vitreous degeneration, bilateral: Secondary | ICD-10-CM | POA: Diagnosis not present

## 2021-08-29 ENCOUNTER — Other Ambulatory Visit: Payer: Self-pay | Admitting: Family Medicine

## 2021-08-29 DIAGNOSIS — I10 Essential (primary) hypertension: Secondary | ICD-10-CM

## 2021-08-29 DIAGNOSIS — K219 Gastro-esophageal reflux disease without esophagitis: Secondary | ICD-10-CM

## 2021-09-07 ENCOUNTER — Other Ambulatory Visit: Payer: Self-pay | Admitting: Family Medicine

## 2021-09-07 DIAGNOSIS — I10 Essential (primary) hypertension: Secondary | ICD-10-CM

## 2021-09-07 DIAGNOSIS — K219 Gastro-esophageal reflux disease without esophagitis: Secondary | ICD-10-CM

## 2021-09-15 ENCOUNTER — Telehealth: Payer: Self-pay

## 2021-09-15 NOTE — Telephone Encounter (Signed)
Received phone call from [place] regarding rx refill requests.   Per pharmacist, patient is requesting the following medications.  -chlorthalidone -True metrix meter, strips, control solution, alcohol swabs -True plus lancets -Rosuvastatin -Duloxetine -enalapril -metformin -metoprolol -omeprazole  Bolded medications had been sent to patient's local pharmacy on 12/5. I attempted to call patient to ask if she wanted these transferred and verify order request. Patient did not answer, mailbox full. Will attempt to contact patient at later time.   Talbot Grumbling, RN

## 2021-10-06 ENCOUNTER — Telehealth: Payer: Self-pay | Admitting: Family Medicine

## 2021-10-06 NOTE — Telephone Encounter (Signed)
Patient's daughter dropped off Disability Parking form to be completed. Last DOS was 07/06/21. Placed in [employer].

## 2021-10-06 NOTE — Telephone Encounter (Signed)
Reviewed form and placed in PCP's box for completion.  .Aissata Wilmore R Akito Boomhower, CMA  

## 2021-10-12 NOTE — Telephone Encounter (Signed)
Patient called and informed that forms are ready for pick up. Copy made and placed in batch scanning. Original placed at front desk for pick up.   Xan Sparkman C Dearl Rudden, RN  

## 2021-10-12 NOTE — Telephone Encounter (Signed)
Form filled out and placed in triage box.   Gerlene Fee, DO 10/12/2021, 1:22 PM PGY-3, [place]

## 2021-10-20 ENCOUNTER — Ambulatory Visit: Payer: Medicare HMO | Admitting: Family Medicine

## 2021-11-02 NOTE — Progress Notes (Addendum)
° ° °  SUBJECTIVE:   CHIEF COMPLAINT / HPI:   [name] is a 66 yo F who presents for follow up. Today we discussed the below.   Diabetes Current Regimen: Metformin 500 mg BID, ozempic 0.5 mg weekly (Tuesday) CBGs:  once daily, 130s Last A1c: 6.7 on 07/06/2021  Denies polyuria, polydipsia, hypoglycemia Last Eye Exam: Planning for March or April Statin: Rosuvastatin 40 m g daily  ACE/ARB: Enalapril 10mg  BID  Hypertension: - Medications: Chlorthalidone 25 mg daily Enalapril 10 mg twice daily, metoprolol 100 mg twice daily - Compliance: Yes - Denies any SOB, CP, vision changes - Diet: States she is eating better, but over the holidays did not adhere to her regular diet.  Now sometimes will eat 1 meal a day.  HM Mammogram: Plan for 01/2022 Colonoscopy: Plan for 12/2021  PERTINENT  PMH / PSH: As above.  OBJECTIVE:   BP 128/74    Pulse 71    Ht 5\' 3"  (1.6 m)    Wt 240 lb 6.4 oz (109 kg)    SpO2 99%    BMI 42.58 kg/m   Results for orders placed or performed in visit on 11/03/21 (from the past 24 hour(s))  HgB A1c     Status: None   Collection Time: 11/03/21  1:30 PM  Result Value Ref Range   Hemoglobin A1C     HbA1c POC (<> result, manual entry)     HbA1c, POC (prediabetic range)     HbA1c, POC (controlled diabetic range) 6.7 0.0 - 7.0 %    Physical Exam Vitals reviewed.  Constitutional:      General: She is not in acute distress.    Appearance: She is not ill-appearing.  Cardiovascular:     Rate and Rhythm: Normal rate and regular rhythm.     Pulses: Normal pulses.     Heart sounds: Normal heart sounds.  Pulmonary:     Effort: Pulmonary effort is normal.     Breath sounds: Normal breath sounds.  Neurological:     Mental Status: She is alert and oriented to person, place, and time.  Psychiatric:        Mood and Affect: Mood normal.        Behavior: Behavior normal.   ASSESSMENT/PLAN:  1. Diabetes mellitus without complication ([place]) Well-controlled.  Continue  current medications.  Follow-up in 6 months.  2. Primary hypertension Well-controlled.  Continue current medications.  Continue to monitor at subsequent visit.  Gerlene Fee, [place]

## 2021-11-03 ENCOUNTER — Other Ambulatory Visit: Payer: Self-pay

## 2021-11-03 ENCOUNTER — Encounter: Payer: Self-pay | Admitting: Family Medicine

## 2021-11-03 ENCOUNTER — Ambulatory Visit (INDEPENDENT_AMBULATORY_CARE_PROVIDER_SITE_OTHER): Payer: Medicare HMO | Admitting: Family Medicine

## 2021-11-03 ENCOUNTER — Telehealth: Payer: Self-pay

## 2021-11-03 VITALS — BP 128/74 | HR 71 | Ht 63.0 in | Wt 240.4 lb

## 2021-11-03 DIAGNOSIS — E782 Mixed hyperlipidemia: Secondary | ICD-10-CM

## 2021-11-03 DIAGNOSIS — E119 Type 2 diabetes mellitus without complications: Secondary | ICD-10-CM | POA: Diagnosis not present

## 2021-11-03 DIAGNOSIS — K219 Gastro-esophageal reflux disease without esophagitis: Secondary | ICD-10-CM | POA: Diagnosis not present

## 2021-11-03 DIAGNOSIS — E1169 Type 2 diabetes mellitus with other specified complication: Secondary | ICD-10-CM | POA: Diagnosis not present

## 2021-11-03 DIAGNOSIS — I10 Essential (primary) hypertension: Secondary | ICD-10-CM | POA: Diagnosis not present

## 2021-11-03 DIAGNOSIS — E785 Hyperlipidemia, unspecified: Secondary | ICD-10-CM | POA: Diagnosis not present

## 2021-11-03 DIAGNOSIS — Z6841 Body Mass Index (BMI) 40.0 and over, adult: Secondary | ICD-10-CM | POA: Diagnosis not present

## 2021-11-03 LAB — POCT GLYCOSYLATED HEMOGLOBIN (HGB A1C): HbA1c, POC (controlled diabetic range): 6.7 % (ref 0.0–7.0)

## 2021-11-03 MED ORDER — ENALAPRIL MALEATE 10 MG PO TABS: 10.0000 mg | ORAL_TABLET | Freq: Two times a day (BID) | ORAL | 0 refills | Status: DC

## 2021-11-03 MED ORDER — CHLORTHALIDONE 25 MG PO TABS: 25.0000 mg | ORAL_TABLET | Freq: Every day | ORAL | 0 refills | Status: DC

## 2021-11-03 MED ORDER — ROSUVASTATIN CALCIUM 40 MG PO TABS: 40.0000 mg | ORAL_TABLET | Freq: Every day | ORAL | 0 refills | Status: DC

## 2021-11-03 MED ORDER — TRUE METRIX BLOOD GLUCOSE TEST VI STRP: ORAL_STRIP | 12 refills | Status: AC

## 2021-11-03 MED ORDER — OMEPRAZOLE 20 MG PO CPDR: 40.0000 mg | DELAYED_RELEASE_CAPSULE | Freq: Every day | ORAL | 0 refills | Status: DC

## 2021-11-03 MED ORDER — METFORMIN HCL ER 500 MG PO TB24: ORAL_TABLET | ORAL | 0 refills | Status: DC

## 2021-11-03 MED ORDER — DULOXETINE HCL 60 MG PO CPEP: 60.0000 mg | ORAL_CAPSULE | Freq: Every day | ORAL | 0 refills | Status: DC

## 2021-11-03 MED ORDER — TRUE METRIX LEVEL 1 LOW VI SOLN: 3 refills | Status: AC

## 2021-11-03 MED ORDER — METOPROLOL TARTRATE 100 MG PO TABS: 100.0000 mg | ORAL_TABLET | Freq: Two times a day (BID) | ORAL | 0 refills | Status: DC

## 2021-11-03 MED ORDER — TRUE METRIX AIR GLUCOSE METER DEVI: 0 refills | Status: DC

## 2021-11-03 MED ORDER — TRUEPLUS LANCETS 33G MISC: 11 refills | Status: AC

## 2021-11-03 NOTE — Telephone Encounter (Signed)
Informed pt one box of ozempic shipped to office and is ready for pickup. Pt has appt today and will pickup then

## 2021-11-03 NOTE — Patient Instructions (Signed)
Thank for coming in today.  Your A1c is 6.7, good job!  Today we discussed following up on getting your mammogram and colonoscopy good job on having these planned.    We will follow-up in 6 months for a repeat A1c or sooner if needed.  Continue to take your blood pressure medications as prescribed.  I have sent in all refills to [facility].  Dr. Janus Molder

## 2021-12-10 ENCOUNTER — Ambulatory Visit (INDEPENDENT_AMBULATORY_CARE_PROVIDER_SITE_OTHER): Payer: Medicare HMO | Admitting: Family Medicine

## 2021-12-10 ENCOUNTER — Other Ambulatory Visit: Payer: Self-pay

## 2021-12-10 VITALS — BP 151/62 | HR 69 | Temp 97.9°F | Ht 63.0 in | Wt 237.4 lb

## 2021-12-10 DIAGNOSIS — J189 Pneumonia, unspecified organism: Secondary | ICD-10-CM | POA: Diagnosis not present

## 2021-12-10 DIAGNOSIS — I10 Essential (primary) hypertension: Secondary | ICD-10-CM | POA: Diagnosis not present

## 2021-12-10 DIAGNOSIS — R059 Cough, unspecified: Secondary | ICD-10-CM | POA: Diagnosis not present

## 2021-12-10 LAB — POCT INFLUENZA A/B
Influenza A, POC: NEGATIVE
Influenza B, POC: NEGATIVE

## 2021-12-10 MED ORDER — AZITHROMYCIN 250 MG PO TABS: ORAL_TABLET | ORAL | 0 refills | Status: AC

## 2021-12-10 NOTE — Patient Instructions (Signed)
Stop by the pharmacy to pick up your antibiotics. If you get more short of breath or continue to have fevers schedule an appointment to be seen.   ?

## 2021-12-10 NOTE — Progress Notes (Unsigned)
° °  SUBJECTIVE:   CHIEF COMPLAINT / HPI:   Chief Complaint  Patient presents with   Cough   Loss of taste   Shortness of Breath     [name] is a 66 y.o. female here for ***   Pt reports ***    PERTINENT  PMH / PSH: reviewed and updated as appropriate   OBJECTIVE:   BP (!) 151/62    Pulse 69    Temp 97.9 F (36.6 C)    Ht '5\' 3"'$  (1.6 m)    Wt 237 lb 6.4 oz (107.7 kg)    SpO2 100%    BMI 42.05 kg/m   ***  ASSESSMENT/PLAN:   No problem-specific Assessment & Plan notes found for this encounter.     Lyndee Hensen, DO PGY-3, [facility] 12/10/2021      {    This will disappear when note is signed, click to select method of visit    :1}

## 2021-12-12 LAB — NOVEL CORONAVIRUS, NAA: SARS-CoV-2, NAA: NOT DETECTED

## 2021-12-15 ENCOUNTER — Encounter: Payer: Self-pay | Admitting: Family Medicine

## 2021-12-15 NOTE — Assessment & Plan Note (Signed)
BP elevated today in the setting of missed medication and taking over-the-counter cold medicines.  Advised patient to take her medication when she returns home.  If home blood pressures are elevated she is to let her PCP know. ?

## 2021-12-23 DIAGNOSIS — Z1211 Encounter for screening for malignant neoplasm of colon: Secondary | ICD-10-CM | POA: Diagnosis not present

## 2021-12-23 DIAGNOSIS — K5730 Diverticulosis of large intestine without perforation or abscess without bleeding: Secondary | ICD-10-CM | POA: Diagnosis not present

## 2021-12-23 DIAGNOSIS — D122 Benign neoplasm of ascending colon: Secondary | ICD-10-CM | POA: Diagnosis not present

## 2022-01-26 ENCOUNTER — Other Ambulatory Visit: Payer: Self-pay | Admitting: Family Medicine

## 2022-01-26 DIAGNOSIS — E1169 Type 2 diabetes mellitus with other specified complication: Secondary | ICD-10-CM

## 2022-01-26 DIAGNOSIS — I10 Essential (primary) hypertension: Secondary | ICD-10-CM

## 2022-01-26 DIAGNOSIS — K219 Gastro-esophageal reflux disease without esophagitis: Secondary | ICD-10-CM

## 2022-01-27 ENCOUNTER — Other Ambulatory Visit: Payer: Self-pay | Admitting: Family Medicine

## 2022-01-27 DIAGNOSIS — I10 Essential (primary) hypertension: Secondary | ICD-10-CM

## 2022-02-03 ENCOUNTER — Other Ambulatory Visit (HOSPITAL_COMMUNITY): Payer: Self-pay

## 2022-02-03 ENCOUNTER — Telehealth: Payer: Self-pay

## 2022-02-03 NOTE — Telephone Encounter (Signed)
Called pt for more info about proof of income that was dropped off at office. ? ?Pt wants to re-enroll with novo nordisk for ozempic medication. Informed her it expired in 09/2021 and she will need to fill out an application along with the income she provided. Pt agreed to have application mailed to temporary address: [place] [id number] ? ?Pt will return as soon as possible. ?

## 2022-03-09 ENCOUNTER — Encounter: Payer: Self-pay | Admitting: *Deleted

## 2022-04-20 ENCOUNTER — Other Ambulatory Visit: Payer: Self-pay | Admitting: Family Medicine

## 2022-04-20 DIAGNOSIS — E119 Type 2 diabetes mellitus without complications: Secondary | ICD-10-CM

## 2022-05-31 ENCOUNTER — Encounter: Payer: Self-pay | Admitting: Family Medicine

## 2022-05-31 ENCOUNTER — Ambulatory Visit (INDEPENDENT_AMBULATORY_CARE_PROVIDER_SITE_OTHER): Payer: Medicare HMO | Admitting: Family Medicine

## 2022-05-31 VITALS — BP 144/88 | HR 67 | Ht 63.0 in | Wt 244.4 lb

## 2022-05-31 DIAGNOSIS — M170 Bilateral primary osteoarthritis of knee: Secondary | ICD-10-CM

## 2022-05-31 DIAGNOSIS — B351 Tinea unguium: Secondary | ICD-10-CM | POA: Insufficient documentation

## 2022-05-31 DIAGNOSIS — E119 Type 2 diabetes mellitus without complications: Secondary | ICD-10-CM

## 2022-05-31 DIAGNOSIS — I10 Essential (primary) hypertension: Secondary | ICD-10-CM | POA: Diagnosis not present

## 2022-05-31 LAB — POCT GLYCOSYLATED HEMOGLOBIN (HGB A1C): HbA1c, POC (controlled diabetic range): 7.8 % — AB (ref 0.0–7.0)

## 2022-05-31 MED ORDER — TERBINAFINE HCL 250 MG PO TABS: 250.0000 mg | ORAL_TABLET | Freq: Every day | ORAL | 0 refills | Status: AC

## 2022-05-31 NOTE — Assessment & Plan Note (Signed)
BP elevated to 143/74 today. Also elevated to 151/62 at prior visit. Pt is unsure if she is taking her chlorthalidone, but is taking enalapril and metoprolol. She seems to have more trouble remembering to take nighttime doses. - Asked pt to figure out if she is taking chlorthalidone. If not, plan to restart. If she is, consider med adjustments. - Cont enalapril and metoprolol.  - To improve med adherence, consider consolidating meds to once daily dosing for AM doses since she misses PM doses.

## 2022-05-31 NOTE — Assessment & Plan Note (Signed)
Pt reports BL knee pain is now interfering with her life. She is unable to stand for >62mns. She has trouble walking from house to car. Unable to walk around grocery store. She uses a cane. Lives in 2 story house. Pt is interested in knee replacement surgery, reports she was previously evaluated and recommended surgery by ortho many years ago. - Referral to Ortho for knee replacement surgery

## 2022-05-31 NOTE — Assessment & Plan Note (Signed)
A1c elevated to 7.8 today. Was previously well-controlled. Recent change is that pt has run out of Ozempic ~6 wks ago, although it may have been as long as 3 months ago to cause A1c effects. - Reach out to Pharmacist to assist pt with financial assistance for Ozempic - Cont Metformin

## 2022-05-31 NOTE — Progress Notes (Signed)
SUBJECTIVE:   CHIEF COMPLAINT / HPI:   [name] is a 66 yo F h/f followup of diabetes, HTN, BL knee osteoarthritis, and toenail fungus. Pt is accompanied by daughter.   Osteoarthritis Pt reports increasing BL knee pain from her osteoarthritis. She uses a walker. The pain is now to the point that she can't stand for greater than 5 minutes before needing a break. She has trouble walking from house to the car. She is unable to walk around the grocery store. She lives with daughter and daughter's husband in 2 story home. Pt stays upstairs and there is no room for her to stay downstairs. She was previously seen by orthopedics and recommended knee replacement surgery at that time, but was worried about the long recovery time. She has since talked to a family member who had a good experience with knee replacement and wants to pursue it.   DM Pt reports she has been off Ozempic for ~6wks. She was previously in the process of getting financial aid for Ozempic, but it was not completed. She is interested in restarting the process.   BP Pt is not sure if she is taking chlorthalidone. She is taking enalipril and metoprolol. She did not take her meds last night, but did this morning. She also reports having a caffeinated drink before clinic.   Toenail fungus Pt is concerned about darkened/blackened and thickened toe nails. She is agreeable to the longer treatment duration and LFT monitoring.  Med Adherence Partly due to her knee pain, pt reports having poor med adherence at times. She misses about 3 evening doses of medications because she'll fall asleep and forget to take them.   Social Hx Pt is accompanied by her daughter to the visit. She lives with her daughter and daughter's husband. Daughter seems to be a strong social support for pt.   PERTINENT  PMH / PSH: HTN, T2DM, osteoarthritis of knees  OBJECTIVE:   BP (!) 144/88   Pulse 67   Ht '5\' 3"'$  (1.6 m)   Wt 244 lb 6.4 oz (110.9 kg)   SpO2 100%    BMI 43.29 kg/m   Gen: Cheerful, friendly older woman sitting comfortably in chair HEENT: NCAT. MMM. Pulm: Regular WOB on RA Feet: Dark/blackened, thickened toenails.   ASSESSMENT/PLAN:   Primary hypertension BP elevated to 143/74 today. Also elevated to 151/62 at prior visit. Pt is unsure if she is taking her chlorthalidone, but is taking enalapril and metoprolol. She seems to have more trouble remembering to take nighttime doses. - Asked pt to figure out if she is taking chlorthalidone. If not, plan to restart. If she is, consider med adjustments. - Cont enalapril and metoprolol.  - To improve med adherence, consider consolidating meds to once daily dosing for AM doses since she misses PM doses.  Diabetes mellitus without complication (HCC) S0F elevated to 7.8 today. Was previously well-controlled. Recent change is that pt has run out of Ozempic ~6 wks ago, although it may have been as long as 3 months ago to cause A1c effects. - Reach out to Pharmacist to assist pt with financial assistance for Ozempic - Cont Metformin  Osteoarthritis of both knees Pt reports BL knee pain is now interfering with her life. She is unable to stand for >16mns. She has trouble walking from house to car. Unable to walk around grocery store. She uses a cane. Lives in 2 story house. Pt is interested in knee replacement surgery, reports she was previously evaluated and  recommended surgery by ortho many years ago. - Referral to Ortho for knee replacement surgery  Onychomycosis On exam, thickened darkened toenails c/w fungal infection. Pt interested in tx.  - Start Terbinafine '250mg'$  daily for 6 wks - LFTs today and in 6 wks   Arlyce Dice, MD [place]

## 2022-05-31 NOTE — Assessment & Plan Note (Deleted)
BP elevated to 143/74 today. Also elevated to 151/62 at prior visit. Pt is unsure if she is taking her chlorthalidone, but is taking enalapril and metoprolol. She seems to have more trouble remembering to take nighttime doses. - Asked pt to figure out if she is taking chlorthalidone. If not, plan to restart. If she is, consider med adjustments. - Cont enalapril and metoprolol.  - To improve med adherence, consider consolidating meds to once daily dosing for AM doses since she misses PM doses.

## 2022-05-31 NOTE — Patient Instructions (Signed)
Good to see you today - Thank you for coming in  Things we discussed today:  1) Knee Pain: I sent a referral to orthopedic surgery to re-evaluate you for potential knee replacement surgery. You should get a call from them to schedule an appointment in the next week.  2) Diabetes: Your A1c is high today at 7.8. Keep taking your metformin. I will reach out to our pharmacist Rosendo Gros to help you get back on Ozempic.  3) High Blood Pressure: Your blood pressure has been high today and at your last visit. Check if you are taking Chlorthalidone '25mg'$  daily at home. If not, let me know and I will adjust your medications to improve your blood pressure.  4) Toe fungus: Start taking Terbinafine once a day for 12 weeks. Come back to see me in 6 weeks to repeat testing to check your liver.  Please always bring your medication bottles  Come back to see me in 6 weeks.

## 2022-05-31 NOTE — Assessment & Plan Note (Signed)
On exam, thickened darkened toenails c/w fungal infection. Pt interested in tx.  - Start Terbinafine '250mg'$  daily for 6 wks - LFTs today and in 6 wks

## 2022-06-01 ENCOUNTER — Other Ambulatory Visit (HOSPITAL_COMMUNITY): Payer: Self-pay

## 2022-06-01 LAB — COMPREHENSIVE METABOLIC PANEL
ALT: 9 IU/L (ref 0–32)
AST: 18 IU/L (ref 0–40)
Albumin/Globulin Ratio: 1.4 (ref 1.2–2.2)
Albumin: 4.2 g/dL (ref 3.9–4.9)
Alkaline Phosphatase: 85 IU/L (ref 44–121)
BUN/Creatinine Ratio: 22 (ref 12–28)
BUN: 25 mg/dL (ref 8–27)
Bilirubin Total: 0.3 mg/dL (ref 0.0–1.2)
CO2: 23 mmol/L (ref 20–29)
Calcium: 9.7 mg/dL (ref 8.7–10.3)
Chloride: 100 mmol/L (ref 96–106)
Creatinine, Ser: 1.14 mg/dL — ABNORMAL HIGH (ref 0.57–1.00)
Globulin, Total: 3 g/dL (ref 1.5–4.5)
Glucose: 154 mg/dL — ABNORMAL HIGH (ref 70–99)
Potassium: 4.4 mmol/L (ref 3.5–5.2)
Sodium: 143 mmol/L (ref 134–144)
Total Protein: 7.2 g/dL (ref 6.0–8.5)
eGFR: 53 mL/min/{1.73_m2} — ABNORMAL LOW (ref >59)

## 2022-06-01 LAB — MICROALBUMIN / CREATININE URINE RATIO
Creatinine, Urine: 152.7 mg/dL
Microalb/Creat Ratio: 19 mg/g creat (ref 0–29)
Microalbumin, Urine: 28.4 ug/mL

## 2022-06-03 ENCOUNTER — Telehealth: Payer: Self-pay

## 2022-06-03 NOTE — Telephone Encounter (Signed)
(  [facility] patient)  Left pt message regarding application process for patient assistance. Asked pt to call office and ask for me.  For ozempic PAP with novo nordisk.

## 2022-06-11 NOTE — Telephone Encounter (Signed)
Patient returns call to nurse line regarding patient assistance.   Will forward to [place].   Talbot Grumbling, RN

## 2022-06-16 NOTE — Telephone Encounter (Signed)
Left message requesting another call back to number [phone]

## 2022-06-17 DIAGNOSIS — Z8701 Personal history of pneumonia (recurrent): Secondary | ICD-10-CM | POA: Diagnosis not present

## 2022-06-17 DIAGNOSIS — R059 Cough, unspecified: Secondary | ICD-10-CM | POA: Diagnosis not present

## 2022-06-17 DIAGNOSIS — H1033 Unspecified acute conjunctivitis, bilateral: Secondary | ICD-10-CM | POA: Diagnosis not present

## 2022-06-17 DIAGNOSIS — R051 Acute cough: Secondary | ICD-10-CM | POA: Diagnosis not present

## 2022-06-17 DIAGNOSIS — R030 Elevated blood-pressure reading, without diagnosis of hypertension: Secondary | ICD-10-CM | POA: Diagnosis not present

## 2022-06-17 DIAGNOSIS — J329 Chronic sinusitis, unspecified: Secondary | ICD-10-CM | POA: Diagnosis not present

## 2022-06-17 DIAGNOSIS — B9689 Other specified bacterial agents as the cause of diseases classified elsewhere: Secondary | ICD-10-CM | POA: Diagnosis not present

## 2022-06-21 NOTE — Telephone Encounter (Signed)
Pt will mail in proof of income to be submitted with novo nordisk re-enrollment application for ozempic. Pt aware it will take a few days/weeks to get approved and shipped to office.

## 2022-06-22 ENCOUNTER — Ambulatory Visit: Payer: Medicare HMO | Admitting: Orthopaedic Surgery

## 2022-06-29 ENCOUNTER — Telehealth: Payer: Self-pay

## 2022-06-29 NOTE — Telephone Encounter (Signed)
Submitted application for OZEMPIC to [facility] for patient assistance.   Phone: [phone]  

## 2022-07-06 ENCOUNTER — Ambulatory Visit: Payer: Self-pay

## 2022-07-06 ENCOUNTER — Encounter: Payer: Self-pay | Admitting: Orthopaedic Surgery

## 2022-07-06 ENCOUNTER — Ambulatory Visit (INDEPENDENT_AMBULATORY_CARE_PROVIDER_SITE_OTHER): Payer: Medicare HMO

## 2022-07-06 ENCOUNTER — Ambulatory Visit: Payer: Medicare HMO | Admitting: Orthopaedic Surgery

## 2022-07-06 VITALS — Ht 64.0 in | Wt 244.0 lb

## 2022-07-06 DIAGNOSIS — M25562 Pain in left knee: Secondary | ICD-10-CM | POA: Diagnosis not present

## 2022-07-06 DIAGNOSIS — M170 Bilateral primary osteoarthritis of knee: Secondary | ICD-10-CM

## 2022-07-06 DIAGNOSIS — M1711 Unilateral primary osteoarthritis, right knee: Secondary | ICD-10-CM

## 2022-07-06 DIAGNOSIS — G8929 Other chronic pain: Secondary | ICD-10-CM

## 2022-07-06 DIAGNOSIS — M1712 Unilateral primary osteoarthritis, left knee: Secondary | ICD-10-CM | POA: Diagnosis not present

## 2022-07-06 DIAGNOSIS — M25561 Pain in right knee: Secondary | ICD-10-CM | POA: Diagnosis not present

## 2022-07-06 MED ORDER — TRAMADOL HCL 50 MG PO TABS: 50.0000 mg | ORAL_TABLET | Freq: Every day | ORAL | 0 refills | Status: DC | PRN

## 2022-07-06 NOTE — Progress Notes (Signed)
Office Visit Note   Patient: [name]           Date of Birth: April 19, 1956           MRN: [id number] Visit Date: 07/06/2022              Requested by: McDiarmid, Blane Ohara, MD [address] [place],  [place] [address] PCP: Arlyce Dice, MD   Assessment & Plan: Visit Diagnoses:  1. Primary osteoarthritis of right knee   2. Primary osteoarthritis of left knee     Plan: Impression is bilateral end-stage knee DJD.  Treatment options were explained and agreed that because she has bone-on-bone knee replacement is her best option for meaningful relief.  Unfortunately her BMI and A1c preclude her from being a surgical candidate at this time.  Goal weight is 230 pounds A1c is 7.7.  Tramadol refilled for breakthrough pain.  Patient will follow-up when she has met her goals for surgery.  Follow-Up Instructions: No follow-ups on file.   Orders:  Orders Placed This Encounter  Procedures   XR KNEE 3 VIEW LEFT   XR KNEE 3 VIEW RIGHT   Meds ordered this encounter  Medications   traMADol (ULTRAM) 50 MG tablet    Sig: Take 1-2 tablets (50-100 mg total) by mouth daily as needed.    Dispense:  20 tablet    Refill:  0      Procedures: No procedures performed   Clinical Data: No additional findings.   Subjective: Chief Complaint  Patient presents with   Right Knee - Pain   Left Knee - Pain    HPI [name] is a very pleasant 66 year old female here for bilateral knee pain.  I saw her 5 years ago for this and we did Monovisc injections.  She has been treating this with medical management for the last 5 years.  Unfortunately injections have stopped working.  Currently living with her daughter in [place] significant pain that interferes with ADLs and quality of life.  Review of Systems  Constitutional: Negative.   HENT: Negative.    Eyes: Negative.   Respiratory: Negative.    Cardiovascular: Negative.   Endocrine: Negative.   Musculoskeletal: Negative.    Neurological: Negative.   Hematological: Negative.   Psychiatric/Behavioral: Negative.    All other systems reviewed and are negative.    Objective: Vital Signs: Ht $RemoveB'5\' 4"'oOpkSXbS$  (1.626 m)   Wt 244 lb (110.7 kg)   BMI 41.88 kg/m   Physical Exam Vitals and nursing note reviewed.  Constitutional:      Appearance: She is well-developed.  HENT:     Head: Atraumatic.     Nose: Nose normal.  Eyes:     Extraocular Movements: Extraocular movements intact.  Cardiovascular:     Pulses: Normal pulses.  Pulmonary:     Effort: Pulmonary effort is normal.  Abdominal:     Palpations: Abdomen is soft.  Musculoskeletal:     Cervical back: Neck supple.  Skin:    General: Skin is warm.     Capillary Refill: Capillary refill takes less than 2 seconds.  Neurological:     Mental Status: She is alert. Mental status is at baseline.  Psychiatric:        Behavior: Behavior normal.        Thought Content: Thought content normal.        Judgment: Judgment normal.     Ortho Exam Lamination of bilateral knees show pain and crepitus  throughout range of motion.  Joint line tenderness.  Collaterals and cruciates are stable. Specialty Comments:  No specialty comments available.  Imaging: XR KNEE 3 VIEW RIGHT  Result Date: 07/06/2022 Advanced tricompartmental degenerative joint disease.  Bone-on-bone joint space narrowing.  XR KNEE 3 VIEW LEFT  Result Date: 07/06/2022 Advanced tricompartmental degenerative joint disease.  Bone-on-bone joint space narrowing.    PMFS History: Patient Active Problem List   Diagnosis Date Noted   Primary osteoarthritis of right knee 07/06/2022   Primary osteoarthritis of left knee 07/06/2022   Onychomycosis 05/31/2022   Nodule of skin of left lower leg 03/17/2021   Lower extremity edema 04/18/2020   Bilateral cataracts 10/13/2018   Allergic rhinitis 07/11/2017   Osteoarthritis of both knees 12/20/2016   Sciatica of right side 02/06/2014   Diabetes mellitus  without complication ([place]) 97/41/6384   Lipoma 03/31/2007   Mixed hyperlipidemia due to type 2 diabetes mellitus ([place]) 12/01/2006   Severe obesity (BMI >= 40) ([place]) 12/01/2006   Primary hypertension 12/01/2006   GERD (gastroesophageal reflux disease) 12/01/2006   Past Medical History:  Diagnosis Date   Diabetes mellitus without complication ([place])    GERD (gastroesophageal reflux disease)    Hypertension     Family History  Problem Relation Age of Onset   Heart disease Mother    Hypertension Father    Hypertension Sister    Hypertension Brother     Past Surgical History:  Procedure Laterality Date   DILATATION & CURETTAGE/HYSTEROSCOPY WITH MYOSURE N/A 08/26/2015   Procedure: DILATATION & CURETTAGE/HYSTEROSCOPY WITH MYOSURE;  Surgeon: [name], MD;  Location: [facility];  Service: Gynecology;  Laterality: N/A;   TUBAL LIGATION     Social History   Occupational History   Not on file  Tobacco Use   Smoking status: Former   Smokeless tobacco: Never  Substance and Sexual Activity   Alcohol use: Yes    Alcohol/week: 0.0 standard drinks of alcohol   Drug use: No   Sexual activity: Never

## 2022-07-10 ENCOUNTER — Other Ambulatory Visit: Payer: Self-pay | Admitting: Family Medicine

## 2022-07-10 DIAGNOSIS — B351 Tinea unguium: Secondary | ICD-10-CM

## 2022-07-12 ENCOUNTER — Ambulatory Visit: Payer: Medicare HMO | Admitting: Family Medicine

## 2022-07-26 ENCOUNTER — Encounter: Payer: Self-pay | Admitting: Family Medicine

## 2022-07-26 ENCOUNTER — Ambulatory Visit (INDEPENDENT_AMBULATORY_CARE_PROVIDER_SITE_OTHER): Payer: Medicare HMO | Admitting: Family Medicine

## 2022-07-26 ENCOUNTER — Other Ambulatory Visit: Payer: Self-pay

## 2022-07-26 VITALS — BP 158/81 | HR 78 | Wt 243.0 lb

## 2022-07-26 DIAGNOSIS — Z23 Encounter for immunization: Secondary | ICD-10-CM

## 2022-07-26 DIAGNOSIS — E119 Type 2 diabetes mellitus without complications: Secondary | ICD-10-CM | POA: Diagnosis not present

## 2022-07-26 DIAGNOSIS — I10 Essential (primary) hypertension: Secondary | ICD-10-CM | POA: Diagnosis not present

## 2022-07-26 DIAGNOSIS — B351 Tinea unguium: Secondary | ICD-10-CM

## 2022-07-26 MED ORDER — METOPROLOL SUCCINATE ER 100 MG PO TB24: 200.0000 mg | ORAL_TABLET | Freq: Every day | ORAL | 3 refills | Status: DC

## 2022-07-26 MED ORDER — ENALAPRIL MALEATE 20 MG PO TABS: 20.0000 mg | ORAL_TABLET | Freq: Every day | ORAL | 3 refills | Status: DC

## 2022-07-26 NOTE — Patient Instructions (Addendum)
Good to see you today - Thank you for coming in  Things we discussed today:  1) Your blood pressure is still high today. I will simplify your medications so you can take all your blood pressure meds once a day. - Start taking Metoprolol Succinate XR '200mg'$  (2 pills) once a day - Start taking Enalapril '20mg'$  (1 pill) daily - Continue taking Chlorthalidone - STOP taking metoprolol tartrate   2) For your toe fungus, we will check you labs today to make sure you are tolerating it well.  3) For diabetes - Continue taking metformin - Once Ozempic is approved, please resume restart at 0.'25mg'$  once a week for 4 weeks. Then you can increase to 0.'5mg'$  a week.   Please always bring your medication bottles  Come back to see me in 1-55month for follow-up on blood pressure and check A1c

## 2022-07-26 NOTE — Assessment & Plan Note (Signed)
Awaiting approval for Ozempic (forms already submitted).  - Once approved, plan to restart Ozempic at 0.'25mg'$  daily and titrate up (since she has been off med for a while now) - Cont Metformin. Since pt reports some GI side effects, I will defer increasing the dose - Due for A1c in 1 month

## 2022-07-26 NOTE — Assessment & Plan Note (Signed)
Had stopped due to having to take antibiotic. She has since restarted and been taking for the last 2 wks.  - Finish 6wk course - Check LFTs today

## 2022-07-26 NOTE — Progress Notes (Signed)
    SUBJECTIVE:   CHIEF COMPLAINT / HPI: HTN, toe fungus, T2dm  LH is a 66yo F h/f f/u of HTN, toe fungus, and T2DM.  Onychomycosis  Pt had started terbinafine initially but then stopped while taking antibiotics for bacterial sinusitis. She has now resumed terbinafine for the past 2 weeks.   HTN Pt brought meds today and reports taking Chlorthalidone, enalapril, and metoprolol. She took meds last night. Pt struggles with med adherence, usually missing her evening doses.  T2DM Pt has submitted forms for Ozempic. Not yet restarted. She has also been taking metformin, but has some GI upset with it.   PERTINENT  PMH / PSH: HTN, GERD, T2DM, OA of BL knees  OBJECTIVE:   BP (!) 158/81   Pulse 78   Wt 243 lb (110.2 kg)   SpO2 97%   BMI 41.71 kg/m   Gen: Well-appearing, cheerful and friendly woman. NAD HEENT: NCAT. MMM. Sclera anicteric CV: RRR Resp: CTAB. Normal WOB on RA.  ASSESSMENT/PLAN:   Primary hypertension BP remains elevated today. Pt is taking chlorthalidone, enalapril, and metoprolol. She struggles with med adherence, especially missing nightime doses. Since pt is likely missing a lot of evening doses of meds, I will consolidate her BP meds to be once daily to improve adherence. - Switch metoprolol succinate '200mg'$  daily - Switch Enalapril '20mg'$  daily  - Cont Chlorthalidone - f/u in 4 wks.  Diabetes mellitus without complication ([place]) Awaiting approval for Ozempic (forms already submitted).  - Once approved, plan to restart Ozempic at 0.'25mg'$  daily and titrate up (since she has been off med for a while now) - Cont Metformin. Since pt reports some GI side effects, I will defer increasing the dose - Due for A1c in 1 month  Onychomycosis Had stopped due to having to take antibiotic. She has since restarted and been taking for the last 2 wks.  - Finish 6wk course - Check LFTs today   Arlyce Dice, MD [place]

## 2022-07-26 NOTE — Assessment & Plan Note (Signed)
BP remains elevated today. Pt is taking chlorthalidone, enalapril, and metoprolol. She struggles with med adherence, especially missing nightime doses. Since pt is likely missing a lot of evening doses of meds, I will consolidate her BP meds to be once daily to improve adherence. - Switch metoprolol succinate '200mg'$  daily - Switch Enalapril '20mg'$  daily  - Cont Chlorthalidone - f/u in 4 wks.

## 2022-07-28 ENCOUNTER — Encounter: Payer: Self-pay | Admitting: Family Medicine

## 2022-07-28 LAB — HEPATIC FUNCTION PANEL
ALT: 12 IU/L (ref 0–32)
AST: 17 IU/L (ref 0–40)
Albumin: 4.4 g/dL (ref 3.9–4.9)
Alkaline Phosphatase: 81 IU/L (ref 44–121)
Bilirubin Total: 0.3 mg/dL (ref 0.0–1.2)
Bilirubin, Direct: 0.11 mg/dL (ref 0.00–0.40)
Total Protein: 7.1 g/dL (ref 6.0–8.5)

## 2022-08-10 ENCOUNTER — Telehealth: Payer: Self-pay

## 2022-08-10 NOTE — Telephone Encounter (Signed)
Informed patient her ozempic (0.'5mg'$  dose) pens are ready for pickup.   5 boxes of ozempic are labeled and ready in med room fridge

## 2022-08-10 NOTE — Telephone Encounter (Signed)
Received notification from [place] regarding approval for [place]. Patient assistance approved UNTIL 10/03/22.  Phone: [phone]

## 2022-09-09 ENCOUNTER — Telehealth: Payer: Self-pay | Admitting: Orthopaedic Surgery

## 2022-09-09 ENCOUNTER — Other Ambulatory Visit: Payer: Self-pay | Admitting: Physician Assistant

## 2022-09-09 MED ORDER — TRAMADOL HCL 50 MG PO TABS: 50.0000 mg | ORAL_TABLET | Freq: Two times a day (BID) | ORAL | 2 refills | Status: DC | PRN

## 2022-09-09 NOTE — Telephone Encounter (Signed)
Pt called refill on tramadol

## 2022-09-09 NOTE — Telephone Encounter (Signed)
Sent in

## 2022-09-20 DIAGNOSIS — E119 Type 2 diabetes mellitus without complications: Secondary | ICD-10-CM | POA: Diagnosis not present

## 2022-09-20 DIAGNOSIS — I10 Essential (primary) hypertension: Secondary | ICD-10-CM | POA: Diagnosis not present

## 2022-10-18 DIAGNOSIS — I10 Essential (primary) hypertension: Secondary | ICD-10-CM | POA: Diagnosis not present

## 2022-10-18 DIAGNOSIS — E119 Type 2 diabetes mellitus without complications: Secondary | ICD-10-CM | POA: Diagnosis not present

## 2022-10-26 ENCOUNTER — Telehealth: Payer: Self-pay | Admitting: Orthopaedic Surgery

## 2022-10-26 ENCOUNTER — Other Ambulatory Visit: Payer: Self-pay | Admitting: Physician Assistant

## 2022-10-26 MED ORDER — TRAMADOL HCL 50 MG PO TABS: 50.0000 mg | ORAL_TABLET | Freq: Every day | ORAL | 0 refills | Status: DC | PRN

## 2022-10-26 NOTE — Telephone Encounter (Signed)
Becky Warehouse manager) called from [facility] requesting refills for tramadol and Duloxetile. Phone number to [facility] is [phone].

## 2022-10-26 NOTE — Telephone Encounter (Signed)
I sent in tramadol, but duloxetine is written by a diff provider

## 2022-11-01 ENCOUNTER — Other Ambulatory Visit: Payer: Self-pay

## 2022-11-01 ENCOUNTER — Telehealth: Payer: Self-pay | Admitting: Orthopaedic Surgery

## 2022-11-01 MED ORDER — TRAMADOL HCL 50 MG PO TABS: 50.0000 mg | ORAL_TABLET | Freq: Every day | ORAL | 0 refills | Status: DC | PRN

## 2022-11-01 MED ORDER — METFORMIN HCL ER 500 MG PO TB24: 500.0000 mg | ORAL_TABLET | Freq: Two times a day (BID) | ORAL | 3 refills | Status: DC

## 2022-11-01 NOTE — Telephone Encounter (Signed)
Lurena Joiner ( Therapist, art Rep) from [facility] called stating pt changed to there pharmacy. Please resend tramadol to Teaching laboratory technician from [place] [facility]. Phone number to [facility] is [phone]. Moving forward please send all scripts for pt to [facility]

## 2022-11-01 NOTE — Telephone Encounter (Signed)
Looks like that number is associated with a pharmacy in [place].  I sent it there.

## 2022-11-03 DIAGNOSIS — E119 Type 2 diabetes mellitus without complications: Secondary | ICD-10-CM | POA: Diagnosis not present

## 2022-11-03 DIAGNOSIS — I10 Essential (primary) hypertension: Secondary | ICD-10-CM | POA: Diagnosis not present

## 2022-11-12 ENCOUNTER — Other Ambulatory Visit: Payer: Self-pay

## 2022-11-12 DIAGNOSIS — I10 Essential (primary) hypertension: Secondary | ICD-10-CM

## 2022-11-12 MED ORDER — CHLORTHALIDONE 25 MG PO TABS: 25.0000 mg | ORAL_TABLET | Freq: Every day | ORAL | 0 refills | Status: DC

## 2022-11-25 ENCOUNTER — Encounter: Payer: Self-pay | Admitting: Family Medicine

## 2022-11-25 ENCOUNTER — Ambulatory Visit (INDEPENDENT_AMBULATORY_CARE_PROVIDER_SITE_OTHER): Payer: Medicare HMO | Admitting: Family Medicine

## 2022-11-25 VITALS — BP 136/85 | HR 77 | Ht 62.0 in | Wt 245.4 lb

## 2022-11-25 DIAGNOSIS — I10 Essential (primary) hypertension: Secondary | ICD-10-CM

## 2022-11-25 DIAGNOSIS — Z780 Asymptomatic menopausal state: Secondary | ICD-10-CM | POA: Diagnosis not present

## 2022-11-25 DIAGNOSIS — Z1231 Encounter for screening mammogram for malignant neoplasm of breast: Secondary | ICD-10-CM

## 2022-11-25 DIAGNOSIS — Z0000 Encounter for general adult medical examination without abnormal findings: Secondary | ICD-10-CM

## 2022-11-25 DIAGNOSIS — E2839 Other primary ovarian failure: Secondary | ICD-10-CM | POA: Diagnosis not present

## 2022-11-25 DIAGNOSIS — E119 Type 2 diabetes mellitus without complications: Secondary | ICD-10-CM | POA: Diagnosis not present

## 2022-11-25 LAB — POCT GLYCOSYLATED HEMOGLOBIN (HGB A1C): HbA1c, POC (controlled diabetic range): 7.1 % — AB (ref 0.0–7.0)

## 2022-11-25 NOTE — Patient Instructions (Addendum)
Good to see you today - Thank you for coming in  Things we discussed today:  1) Go to [facility] and ask for your Shingrix shot (shingles). The prescription should already be sent in.  2) Remember to take your blood pressure meds!   3) Bring your colonsocopy records next times so we can scan it into your records.  4) Call [facility] to schedule your mammogram and DEXA scan. This is to look for breast lumps and osteoporosis.   5) Make an appointment at [facility] for Diabetic Eye exam.   6) Congratulations, your A1c is 7.1 today. You are making improvement!  Please always bring your medication bottles  Come back to see me in 3 months to recheck your A1c.

## 2022-11-25 NOTE — Progress Notes (Signed)
    SUBJECTIVE:   CHIEF COMPLAINT / HPI:   [name] is a 67yo F p/f physical   HTN Pt reports not taking meds this morning.  Due for mammorgram, DEXA, and diabetic eye exam  PERTINENT  PMH / PSH: HTN, GERD, T2DM, OA of BL knees   OBJECTIVE:   BP 136/85   Pulse 77   Ht 5' 2"$  (1.575 m)   Wt 245 lb 6.4 oz (111.3 kg)   SpO2 99%   BMI 44.88 kg/m   Gen: Pleasant, alert. Sitting comfortably and speaking in full sentences. HEENT: NCAT.MMM. Resp: CTAB. Normal WOB on RA. CV: RRR. Abm: Soft, nontender. Normal BS.  Foot Exam: wnl (documented in Epic)  ASSESSMENT/PLAN:   Diabetes mellitus without complication (HCC) 123456 stable/slightly improving today. - Due for diabetic eye exam. Pt plans to make appointment. - Foot exam performed today. - Cont Ozempic and metformin.  - A1c in 3 months  Encounter for health maintenance examination Due for mammogram and DEXA. Orders placed and [facility] contact info provided.  Primary hypertension Did not take meds this morning. Medication adherence has been an ongoing challenge, we have since simplified medication regimen. BP is better controlled today. - Cont metop, enalapril, and chlorthalidone   Needs A1c in 3 months, if A1c not improved, consider uptitrating ozempic vs metformin (GI side effects).   Arlyce Dice, MD [place] [address]

## 2022-11-27 NOTE — Assessment & Plan Note (Addendum)
A1c stable/slightly improving today. - Due for diabetic eye exam. Pt plans to make appointment. - Foot exam performed today. - Cont Ozempic and metformin.  - A1c in 3 months

## 2022-11-27 NOTE — Assessment & Plan Note (Signed)
Did not take meds this morning. Medication adherence has been an ongoing challenge, we have since simplified medication regimen. BP is better controlled today. - Cont metop, enalapril, and chlorthalidone

## 2022-11-27 NOTE — Assessment & Plan Note (Signed)
Due for mammogram and DEXA. Orders placed and [facility] contact info provided.

## 2022-12-06 ENCOUNTER — Telehealth: Payer: Self-pay

## 2022-12-06 NOTE — Telephone Encounter (Signed)
Submitted re-enrollment application for OZEMPIC to [place] for patient assistance, via online portal.   Phone: [phone]

## 2022-12-10 ENCOUNTER — Telehealth: Payer: Self-pay | Admitting: Family Medicine

## 2022-12-10 NOTE — Telephone Encounter (Signed)
Contacted [employer] to schedule their annual wellness visit. Appointment made for 12/20/2022.  Thank you,  [place] Direct dial  [phone]

## 2022-12-15 NOTE — Telephone Encounter (Signed)
Received notification from [place] regarding approval for OZEMPIC 0.'5MG'$  DOSE PENS. Patient assistance approved from 12/09/22 to 10/04/23.  MEDICATION WILL SHIP TO OFFICE  Phone: [phone]

## 2022-12-16 ENCOUNTER — Other Ambulatory Visit: Payer: Self-pay | Admitting: Family Medicine

## 2022-12-16 DIAGNOSIS — I10 Essential (primary) hypertension: Secondary | ICD-10-CM

## 2022-12-20 ENCOUNTER — Ambulatory Visit: Payer: Medicare HMO

## 2022-12-21 ENCOUNTER — Telehealth: Payer: Self-pay

## 2022-12-21 ENCOUNTER — Other Ambulatory Visit: Payer: Self-pay | Admitting: Family Medicine

## 2022-12-21 ENCOUNTER — Ambulatory Visit: Payer: Medicare HMO

## 2022-12-21 DIAGNOSIS — Z1231 Encounter for screening mammogram for malignant neoplasm of breast: Secondary | ICD-10-CM

## 2022-12-21 NOTE — Telephone Encounter (Signed)
Left message informing patient of novo nordisk shipment being ready for pickup  5 boxes of ozempic 0.5mg  dose pens are labeled and ready in med room fridge

## 2022-12-24 NOTE — Telephone Encounter (Signed)
Patient's cousin, Langley Gauss, presents to clinic to pick up shipment.   Provided with medication shipment per note from [place].   Talbot Grumbling, RN

## 2022-12-29 NOTE — Progress Notes (Unsigned)
   I attempted to contact the patient several times for her , no answer for her Virtual Annual Medicare Visit. No answer. I left a detail message to return my call [phone] on her personal voicemail.

## 2022-12-29 NOTE — Patient Instructions (Signed)
What should I know about cancer screening? Depending on your health history and family history, you may need to have cancer screening at various ages. This may include screening for: Breast cancer. Cervical cancer. Colorectal cancer. Skin cancer. Lung cancer. What should I know about heart disease, diabetes, and high blood pressure? Blood pressure and heart disease High blood pressure causes heart disease and increases the risk of stroke. This is more likely to develop in people who have high blood pressure readings or are overweight. Have your blood pressure checked: Every 3-5 years if you are 60-64 years of age. Every year if you are 66 years old or older. Diabetes Have regular diabetes screenings. This checks your fasting blood sugar level. Have the screening done: Once every three years after age 68 if you are at a normal weight and have a low risk for diabetes. More often and at a younger age if you are overweight or have a high risk for diabetes. What should I know about preventing infection? Hepatitis B If you have a higher risk for hepatitis B, you should be screened for this virus. Talk with your health care provider to find out if you are at risk for hepatitis B infection. Hepatitis C Testing is recommended for: Everyone born from [birth year] through 1966. Anyone with known risk factors for hepatitis C. Sexually transmitted infections (STIs) Get screened for STIs, including gonorrhea and chlamydia, if: You are sexually active and are younger than 68 years of age. You are older than 68 years of age and your health care provider tells you that you are at risk for this type of infection. Your sexual activity has changed since you were last screened, and you are at increased risk for chlamydia or gonorrhea. Ask your health care provider if you are at risk. Ask your health care provider about whether you are at high risk for HIV. Your health care provider may recommend a prescription  medicine to help prevent HIV infection. If you choose to take medicine to prevent HIV, you should first get tested for HIV. You should then be tested every 3 months for as long as you are taking the medicine. Pregnancy If you are about to stop having your period (premenopausal) and you may become pregnant, seek counseling before you get pregnant. Take 400 to 800 micrograms (mcg) of folic acid every day if you become pregnant. Ask for birth control (contraception) if you want to prevent pregnancy. Osteoporosis and menopause Osteoporosis is a disease in which the bones lose minerals and strength with aging. This can result in bone fractures. If you are 77 years old or older, or if you are at risk for osteoporosis and fractures, ask your health care provider if you should: Be screened for bone loss. Take a calcium or vitamin D supplement to lower your risk of fractures. Be given hormone replacement therapy (HRT) to treat symptoms of menopause. Follow these instructions at home: Alcohol use Do not drink alcohol if: Your health care provider tells you not to drink. You are pregnant, may be pregnant, or are planning to become pregnant. If you drink alcohol: Limit how much you have to: 0-1 drink a day. Know how much alcohol is in your drink. In the [place] one drink equals one 12 oz bottle of beer (355 mL), one 5 oz glass of wine (148 mL), or one 1 oz glass of hard liquor (44 mL). Lifestyle Do not use any products that contain nicotine or tobacco. These products include cigarettes, chewing  tobacco, and vaping devices, such as e-cigarettes. If you need help quitting, ask your health care provider. Do not use street drugs. Do not share needles. Ask your health care provider for help if you need support or information about quitting drugs. General instructions Schedule regular health, dental, and eye exams. Stay current with your vaccines. Tell your health care provider if: You often feel  depressed. You have ever been abused or do not feel safe at home. Summary Adopting a healthy lifestyle and getting preventive care are important in promoting health and wellness. Follow your health care provider's instructions about healthy diet, exercising, and getting tested or screened for diseases. Follow your health care provider's instructions on monitoring your cholesterol and blood pressure. This information is not intended to replace advice given to you by your health care provider. Make sure you discuss any questions you have with your health care provider. Document Revised: 02/09/2021 Document Reviewed: 02/09/2021 Elsevier Patient Education  2023 [employer].

## 2022-12-30 ENCOUNTER — Ambulatory Visit (INDEPENDENT_AMBULATORY_CARE_PROVIDER_SITE_OTHER): Payer: Medicare HMO

## 2022-12-30 DIAGNOSIS — Z0000 Encounter for general adult medical examination without abnormal findings: Secondary | ICD-10-CM

## 2023-01-02 DIAGNOSIS — I10 Essential (primary) hypertension: Secondary | ICD-10-CM | POA: Diagnosis not present

## 2023-01-02 DIAGNOSIS — E119 Type 2 diabetes mellitus without complications: Secondary | ICD-10-CM | POA: Diagnosis not present

## 2023-02-01 DIAGNOSIS — I10 Essential (primary) hypertension: Secondary | ICD-10-CM | POA: Diagnosis not present

## 2023-02-01 DIAGNOSIS — E119 Type 2 diabetes mellitus without complications: Secondary | ICD-10-CM | POA: Diagnosis not present

## 2023-02-04 ENCOUNTER — Ambulatory Visit: Payer: Medicare HMO

## 2023-02-11 ENCOUNTER — Other Ambulatory Visit: Payer: Self-pay | Admitting: Family Medicine

## 2023-02-11 DIAGNOSIS — I10 Essential (primary) hypertension: Secondary | ICD-10-CM

## 2023-02-23 ENCOUNTER — Telehealth: Payer: Self-pay | Admitting: Orthopaedic Surgery

## 2023-02-23 ENCOUNTER — Other Ambulatory Visit: Payer: Self-pay | Admitting: Physician Assistant

## 2023-02-23 MED ORDER — TRAMADOL HCL 50 MG PO TABS: 50.0000 mg | ORAL_TABLET | Freq: Every day | ORAL | 0 refills | Status: DC | PRN

## 2023-02-23 NOTE — Telephone Encounter (Signed)
[facility]

## 2023-02-23 NOTE — Telephone Encounter (Signed)
[facility] refill for Tramadol 50mg  --[phone]

## 2023-03-01 ENCOUNTER — Other Ambulatory Visit: Payer: Self-pay

## 2023-03-01 DIAGNOSIS — G4700 Insomnia, unspecified: Secondary | ICD-10-CM

## 2023-03-01 DIAGNOSIS — E119 Type 2 diabetes mellitus without complications: Secondary | ICD-10-CM

## 2023-03-01 DIAGNOSIS — I10 Essential (primary) hypertension: Secondary | ICD-10-CM

## 2023-03-01 DIAGNOSIS — E1169 Type 2 diabetes mellitus with other specified complication: Secondary | ICD-10-CM

## 2023-03-01 DIAGNOSIS — K219 Gastro-esophageal reflux disease without esophagitis: Secondary | ICD-10-CM

## 2023-03-01 NOTE — Telephone Encounter (Signed)
Patient would like all these prescriptions sent to [facility]. Thank you. Penni Bombard CMA

## 2023-03-02 MED ORDER — METFORMIN HCL ER 500 MG PO TB24: 500.0000 mg | ORAL_TABLET | Freq: Two times a day (BID) | ORAL | 3 refills | Status: DC

## 2023-03-02 MED ORDER — OMEPRAZOLE 20 MG PO CPDR: 40.0000 mg | DELAYED_RELEASE_CAPSULE | Freq: Every day | ORAL | 3 refills | Status: DC

## 2023-03-02 MED ORDER — TRAZODONE HCL 50 MG PO TABS
50.0000 mg | ORAL_TABLET | Freq: Every evening | ORAL | 3 refills | Status: AC | PRN
Start: 2023-03-02 — End: ?

## 2023-03-02 MED ORDER — ROSUVASTATIN CALCIUM 40 MG PO TABS: 40.0000 mg | ORAL_TABLET | Freq: Every day | ORAL | 3 refills | Status: DC

## 2023-03-02 MED ORDER — CHLORTHALIDONE 25 MG PO TABS: 25.0000 mg | ORAL_TABLET | Freq: Every day | ORAL | 3 refills | Status: DC

## 2023-03-07 ENCOUNTER — Ambulatory Visit: Payer: Medicare HMO

## 2023-03-29 ENCOUNTER — Telehealth: Payer: Self-pay

## 2023-03-29 NOTE — Telephone Encounter (Signed)
Informed patient her novo nordisk shipment is ready for pickup  5 boxes of ozempic 0.5mg dose pens are labeled and ready in med room fridge. 

## 2023-04-03 DIAGNOSIS — I10 Essential (primary) hypertension: Secondary | ICD-10-CM | POA: Diagnosis not present

## 2023-04-03 DIAGNOSIS — E119 Type 2 diabetes mellitus without complications: Secondary | ICD-10-CM | POA: Diagnosis not present

## 2023-04-06 NOTE — Telephone Encounter (Signed)
Shipment given to patient.

## 2023-04-14 ENCOUNTER — Other Ambulatory Visit: Payer: Self-pay

## 2023-04-14 DIAGNOSIS — I10 Essential (primary) hypertension: Secondary | ICD-10-CM | POA: Diagnosis not present

## 2023-04-14 DIAGNOSIS — M79606 Pain in leg, unspecified: Secondary | ICD-10-CM

## 2023-04-14 DIAGNOSIS — M179 Osteoarthritis of knee, unspecified: Secondary | ICD-10-CM

## 2023-04-14 DIAGNOSIS — E119 Type 2 diabetes mellitus without complications: Secondary | ICD-10-CM | POA: Diagnosis not present

## 2023-04-14 MED ORDER — DULOXETINE HCL 60 MG PO CPEP: 60.0000 mg | ORAL_CAPSULE | Freq: Every day | ORAL | 3 refills | Status: DC

## 2023-04-18 ENCOUNTER — Telehealth: Payer: Self-pay | Admitting: Orthopaedic Surgery

## 2023-04-18 NOTE — Telephone Encounter (Signed)
Pt called in requesting refill on Tramadol sent to [facility]

## 2023-04-19 ENCOUNTER — Other Ambulatory Visit: Payer: Self-pay | Admitting: Physician Assistant

## 2023-04-19 MED ORDER — TRAMADOL HCL 50 MG PO TABS: 50.0000 mg | ORAL_TABLET | Freq: Two times a day (BID) | ORAL | 2 refills | Status: AC | PRN

## 2023-04-19 NOTE — Telephone Encounter (Signed)
 sent 

## 2023-04-25 ENCOUNTER — Other Ambulatory Visit: Payer: Self-pay

## 2023-05-04 DIAGNOSIS — E119 Type 2 diabetes mellitus without complications: Secondary | ICD-10-CM | POA: Diagnosis not present

## 2023-05-04 DIAGNOSIS — I10 Essential (primary) hypertension: Secondary | ICD-10-CM | POA: Diagnosis not present

## 2023-05-15 ENCOUNTER — Other Ambulatory Visit: Payer: Self-pay | Admitting: Family Medicine

## 2023-05-15 DIAGNOSIS — I10 Essential (primary) hypertension: Secondary | ICD-10-CM

## 2023-06-04 DIAGNOSIS — E119 Type 2 diabetes mellitus without complications: Secondary | ICD-10-CM | POA: Diagnosis not present

## 2023-06-04 DIAGNOSIS — I10 Essential (primary) hypertension: Secondary | ICD-10-CM | POA: Diagnosis not present

## 2023-06-13 ENCOUNTER — Other Ambulatory Visit: Payer: Self-pay | Admitting: Family Medicine

## 2023-06-13 DIAGNOSIS — I10 Essential (primary) hypertension: Secondary | ICD-10-CM

## 2023-06-20 DIAGNOSIS — I10 Essential (primary) hypertension: Secondary | ICD-10-CM | POA: Diagnosis not present

## 2023-06-20 DIAGNOSIS — E119 Type 2 diabetes mellitus without complications: Secondary | ICD-10-CM | POA: Diagnosis not present

## 2023-06-24 ENCOUNTER — Ambulatory Visit
Admission: RE | Admit: 2023-06-24 | Discharge: 2023-06-24 | Disposition: A | Discharge status: Home / Self Care | Payer: Medicare HMO | Source: Ambulatory Visit | Attending: Family Medicine | Admitting: Family Medicine

## 2023-06-24 DIAGNOSIS — N958 Other specified menopausal and perimenopausal disorders: Secondary | ICD-10-CM | POA: Diagnosis not present

## 2023-06-24 DIAGNOSIS — E349 Endocrine disorder, unspecified: Secondary | ICD-10-CM | POA: Diagnosis not present

## 2023-06-24 DIAGNOSIS — E2839 Other primary ovarian failure: Secondary | ICD-10-CM

## 2023-06-24 DIAGNOSIS — Z780 Asymptomatic menopausal state: Secondary | ICD-10-CM

## 2023-06-27 ENCOUNTER — Ambulatory Visit: Payer: Medicare HMO

## 2023-06-27 VITALS — Ht 62.0 in | Wt 245.0 lb

## 2023-06-27 DIAGNOSIS — Z0000 Encounter for general adult medical examination without abnormal findings: Secondary | ICD-10-CM

## 2023-06-27 NOTE — Patient Instructions (Signed)
[name] , Thank you for taking time to come for your Medicare Wellness Visit. I appreciate your ongoing commitment to your health goals. Please review the following plan we discussed and let me know if I can assist you in the future.   Referrals/Orders/Follow-Ups/Clinician Recommendations: Aim for 30 minutes of exercise or brisk walking, 6-8 glasses of water, and 5 servings of fruits and vegetables each day.  This is a list of the screening recommended for you and due dates:  Health Maintenance  Topic Date Due   Eye exam for diabetics  10/27/2021   Mammogram  10/28/2021   Flu Shot  05/05/2023   Hemoglobin A1C  05/26/2023   Yearly kidney function blood test for diabetes  06/01/2023   Yearly kidney health urinalysis for diabetes  06/01/2023   COVID-19 Vaccine (5 - 2023-24 season) 06/05/2023   Complete foot exam   11/26/2023   Medicare Annual Wellness Visit  06/26/2024   DTaP/Tdap/Td vaccine (4 - Td or Tdap) 05/09/2029   Colon Cancer Screening  12/24/2031   Pneumonia Vaccine  Completed   DEXA scan (bone density measurement)  Completed   Hepatitis C Screening  Completed   Zoster (Shingles) Vaccine  Completed   HPV Vaccine  Aged Out    Advanced directives: (ACP Link)Information on Advanced Care Planning can be found at [facility] Advance Health Care Directives Advance Health Care Directives ([email or web address]   Next Medicare Annual Wellness Visit scheduled for next year: Yes

## 2023-06-27 NOTE — Progress Notes (Signed)
Subjective:   [name] is a 67 y.o. female who presents for an Initial Medicare Annual Wellness Visit.  Visit Complete: Virtual  I connected with  Rhona Leavens Hopkins on 06/27/23 by a audio enabled telemedicine application and verified that I am speaking with the correct person using two identifiers.  Patient Location: Home  Provider Location: Home Office  I discussed the limitations of evaluation and management by telemedicine. The patient expressed understanding and agreed to proceed.  Patient Medicare AWV questionnaire was completed by the patient on 06/26/23; I have confirmed that all information answered by patient is correct and no changes since this date.  Vital Signs: Because this visit was a virtual/telehealth visit, some criteria may be missing or patient reported. Any vitals not documented were not able to be obtained and vitals that have been documented are patient reported.   Cardiac Risk Factors include: advanced age (>80men, >71 women);diabetes mellitus;dyslipidemia;hypertension;sedentary lifestyle     Objective:    Today's Vitals   06/27/23 1411  Weight: 245 lb (111.1 kg)  Height: 5\' 2"  (1.575 m)   Body mass index is 44.81 kg/m.     06/27/2023    2:17 PM 11/25/2022    1:38 PM 07/26/2022    1:30 PM 05/31/2022    2:41 PM 12/10/2021   10:42 AM 07/06/2021   10:29 AM 06/04/2020    3:42 PM  Advanced Directives  Does Patient Have a Medical Advance Directive? No No No No No No No  Would patient like information on creating a medical advance directive? Yes (MAU/Ambulatory/Procedural Areas - Information given) No - Patient declined No - Patient declined  No - Patient declined No - Patient declined No - Patient declined    Current Medications (verified) Outpatient Encounter Medications as of 06/27/2023  Medication Sig   acetaminophen (TYLENOL) 650 MG CR tablet Take 650 mg by mouth every 8 (eight) hours as needed for pain.   Blood Glucose Calibration (TRUE  METRIX LEVEL 1) Low SOLN Use to calibrate glucose meter as needed   Blood Glucose Monitoring Suppl (TRUE METRIX AIR GLUCOSE METER) w/Device KIT USE AS DIRECTED   chlorthalidone (HYGROTON) 25 MG tablet Take 1 tablet by mouth once daily   co-enzyme Q-10 30 MG capsule Take 100 mg by mouth 2 (two) times daily.    diclofenac sodium (VOLTAREN) 1 % GEL Apply 2 g topically 4 (four) times daily.   DULoxetine (CYMBALTA) 60 MG capsule Take 1 capsule (60 mg total) by mouth daily.   enalapril (VASOTEC) 20 MG tablet TAKE ONE TABLET BY MOUTH DAILY AT 9AM   glucose blood (TRUE METRIX BLOOD GLUCOSE TEST) test strip Use as instructed   metFORMIN (GLUCOPHAGE-XR) 500 MG 24 hr tablet Take 1 tablet (500 mg total) by mouth in the morning and at bedtime.   metoprolol succinate (TOPROL-XL) 100 MG 24 hr tablet TAKE TWO TABLETS BY MOUTH DAILY AT 9AM TAKE WITH OR IMMEDIATELY FOLLOWING A MEAL.   omeprazole (PRILOSEC) 20 MG capsule Take 2 capsules (40 mg total) by mouth daily.   OZEMPIC, 0.25 OR 0.5 MG/DOSE, 2 MG/1.5ML SOPN INJECT 0.5MG  INTO THE SKIN ONCE A WEEK   rosuvastatin (CRESTOR) 40 MG tablet Take 1 tablet (40 mg total) by mouth daily.   traMADol (ULTRAM) 50 MG tablet Take 1-2 tablets (50-100 mg total) by mouth daily as needed.   traMADol (ULTRAM) 50 MG tablet Take 1-2 tablets (50-100 mg total) by mouth every 12 (twelve) hours as needed.   traZODone (DESYREL) 50  MG tablet Take 1 tablet (50 mg total) by mouth at bedtime as needed for sleep. 1-2 tabs by mouth at bedtime as needed for sleep 1-2 tabs by mouth at bedtime as needed for sleep   TRUEplus Lancets 33G MISC Use to check blood sugar daily or as needed   No facility-administered encounter medications on file as of 06/27/2023.    Allergies (verified) Amoxicillin, Lovastatin, and Penicillins   History: Past Medical History:  Diagnosis Date   Allergy not sure   Arthritis 2013   Cataract 2015   removed 2022   Diabetes mellitus without complication (HCC)     GERD (gastroesophageal reflux disease)    Hypertension    Past Surgical History:  Procedure Laterality Date   DILATATION & CURETTAGE/HYSTEROSCOPY WITH MYOSURE N/A 08/26/2015   Procedure: DILATATION & CURETTAGE/HYSTEROSCOPY WITH MYOSURE;  Surgeon: Ok Edwards, MD;  Location: [facility];  Service: Gynecology;  Laterality: N/A;   EYE SURGERY  2022   caterects   TUBAL LIGATION     Family History  Problem Relation Age of Onset   Heart disease Mother    Hypertension Mother    Kidney disease Mother    Hypertension Father    Heart disease Father    Hypertension Sister    Hypertension Brother    Social History   Socioeconomic History   Marital status: Single    Spouse name: Not on file   Number of children: Not on file   Years of education: Not on file   Highest education level: Not on file  Occupational History   Not on file  Tobacco Use   Smoking status: Former    Current packs/day: 0.00    Average packs/day: 1 pack/day for 10.0 years (10.0 ttl pk-yrs)    Types: Cigarettes    Quit date: 06/04/1996    Years since quitting: 27.0   Smokeless tobacco: Never  Substance and Sexual Activity   Alcohol use: Yes    Alcohol/week: 1.0 - 2.0 standard drink of alcohol    Types: 1 - 2 Glasses of wine per week   Drug use: Never   Sexual activity: Not Currently    Birth control/protection: None  Other Topics Concern   Not on file  Social History Narrative   Not on file   Social Determinants of Health   Financial Resource Strain: Low Risk  (06/26/2023)   Overall Financial Resource Strain (CARDIA)    Difficulty of Paying Living Expenses: Not hard at all  Food Insecurity: No Food Insecurity (06/26/2023)   Hunger Vital Sign    Worried About Running Out of Food in the Last Year: Never true    Ran Out of Food in the Last Year: Never true  Transportation Needs: No Transportation Needs (06/26/2023)   PRAPARE - Administrator, Civil Service (Medical): No    Lack of Transportation  (Non-Medical): No  Physical Activity: Inactive (06/26/2023)   Exercise Vital Sign    Days of Exercise per Week: 0 days    Minutes of Exercise per Session: 0 min  Stress: No Stress Concern Present (06/26/2023)   [employer] of Occupational Health - Occupational Stress Questionnaire    Feeling of Stress : Not at all  Social Connections: Moderately Isolated (06/26/2023)   Social Connection and Isolation Panel [NHANES]    Frequency of Communication with Friends and Family: More than three times a week    Frequency of Social Gatherings with Friends and Family: Once a week  Attends Religious Services: 1 to 4 times per year    Active Member of Clubs or Organizations: No    Attends Banker Meetings: Never    Marital Status: Widowed    Tobacco Counseling Counseling given: Not Answered   Clinical Intake:  Pre-visit preparation completed: Yes  Pain : No/denies pain     Diabetes: No  How often do you need to have someone help you when you read instructions, pamphlets, or other written materials from your doctor or pharmacy?: 2 - Rarely  Interpreter Needed?: No  Information entered by :: Kandis Fantasia LPN   Activities of Daily Living    06/26/2023    3:41 PM  In your present state of health, do you have any difficulty performing the following activities:  Hearing? 0  Vision? 0  Difficulty concentrating or making decisions? 0  Walking or climbing stairs? 1  Dressing or bathing? 0  Doing errands, shopping? 1  Preparing Food and eating ? N  Using the Toilet? N  In the past six months, have you accidently leaked urine? Y  Do you have problems with loss of bowel control? N  Managing your Medications? N  Managing your Finances? N  Housekeeping or managing your Housekeeping? N    Patient Care Team: Lincoln Brigham, MD as PCP - General (Family Medicine) Tarry Kos, MD as Attending Physician (Orthopedic Surgery) Mateo Flow, MD as Consulting Physician  (Ophthalmology)  Indicate any recent Medical Services you may have received from other than [facility] providers in the past year (date may be approximate).     Assessment:   This is a routine wellness examination for [name].  Hearing/Vision screen Hearing Screening - Comments:: Denies hearing difficulties   Vision Screening - Comments:: Wears rx glasses - up to date with routine eye exams with [facility]    Goals Addressed             This Visit's Progress    Remain active and independent         Depression Screen    06/27/2023    2:15 PM 11/25/2022    1:38 PM 07/26/2022    1:30 PM 05/31/2022    2:40 PM 12/10/2021   10:42 AM 11/03/2021    1:34 PM 07/06/2021   10:28 AM  PHQ 2/9 Scores  PHQ - 2 Score 0 0 0 0 0 0 0  PHQ- 9 Score  3 2 2 3 3 2     Fall Risk    06/26/2023    3:41 PM 07/26/2022    1:30 PM 05/31/2022    2:40 PM 12/10/2021   10:45 AM 07/06/2021   10:28 AM  Fall Risk   Falls in the past year? 1 0 1 0 0  Number falls in past yr: 0 0 0 0 0  Injury with Fall? 0 0 0 0 0  Risk for fall due to : Impaired balance/gait;Impaired mobility      Follow up Education provided;Falls prevention discussed;Falls evaluation completed  Falls evaluation completed      MEDICARE RISK AT HOME: Medicare Risk at Home Any stairs in or around the home?: Yes If so, are there any without handrails?: No Home free of loose throw rugs in walkways, pet beds, electrical cords, etc?: Yes Adequate lighting in your home to reduce risk of falls?: Yes Life alert?: No Use of a cane, walker or w/c?: No Grab bars in the bathroom?: Yes Shower chair or bench in shower?: Yes Elevated  toilet seat or a handicapped toilet?: No  TIMED UP AND GO:  Was the test performed? No    Cognitive Function:        06/27/2023    2:16 PM  6CIT Screen  What Year? 0 points  What month? 0 points  What time? 0 points  Count back from 20 0 points  Months in reverse 0 points  Repeat phrase 0 points  Total  Score 0 points    Immunizations Immunization History  Administered Date(s) Administered   Fluad Quad(high Dose 65+) 10/31/2020, 07/26/2022   Influenza Split 08/16/2011, 09/20/2012   Influenza,inj,Quad PF,6+ Mos 07/23/2013, 12/09/2014, 08/11/2016, 07/11/2017, 09/05/2018, 11/09/2019, 07/06/2021   Influenza,inj,quad, With Preservative 11/09/2019   Janssen (J&J) SARS-COV-2 Vaccination 12/13/2019   PFIZER Comirnaty(Gray Top)Covid-19 Tri-Sucrose Vaccine 03/16/2021   PFIZER(Purple Top)SARS-COV-2 Vaccination 07/30/2020   PNEUMOCOCCAL CONJUGATE-20 03/16/2021   Pfizer Covid-19 Vaccine Bivalent Booster 38yrs & up 07/27/2021   Pneumococcal Polysaccharide-23 08/11/2016   RSV,unspecified 02/19/2023   Td 10/04/1996, 12/13/2008   Tdap 05/10/2019   Zoster Recombinant(Shingrix) 02/19/2023, 05/03/2023    TDAP status: Up to date  Flu Vaccine status: Due, Education has been provided regarding the importance of this vaccine. Advised may receive this vaccine at local pharmacy or Health Dept. Aware to provide a copy of the vaccination record if obtained from local pharmacy or Health Dept. Verbalized acceptance and understanding.  Pneumococcal vaccine status: Up to date  Covid-19 vaccine status: Information provided on how to obtain vaccines.   Qualifies for Shingles Vaccine? Yes   Zostavax completed No   Shingrix Completed?: Yes  Screening Tests Health Maintenance  Topic Date Due   OPHTHALMOLOGY EXAM  10/27/2021   MAMMOGRAM  10/28/2021   INFLUENZA VACCINE  05/05/2023   HEMOGLOBIN A1C  05/26/2023   Diabetic kidney evaluation - eGFR measurement  06/01/2023   Diabetic kidney evaluation - Urine ACR  06/01/2023   COVID-19 Vaccine (5 - 2023-24 season) 06/05/2023   FOOT EXAM  11/26/2023   Medicare Annual Wellness (AWV)  06/26/2024   DTaP/Tdap/Td (4 - Td or Tdap) 05/09/2029   Colonoscopy  12/24/2031   Pneumonia Vaccine 46+ Years old  Completed   DEXA SCAN  Completed   Hepatitis C Screening   Completed   Zoster Vaccines- Shingrix  Completed   HPV VACCINES  Aged Out    Health Maintenance  Health Maintenance Due  Topic Date Due   OPHTHALMOLOGY EXAM  10/27/2021   MAMMOGRAM  10/28/2021   INFLUENZA VACCINE  05/05/2023   HEMOGLOBIN A1C  05/26/2023   Diabetic kidney evaluation - eGFR measurement  06/01/2023   Diabetic kidney evaluation - Urine ACR  06/01/2023   COVID-19 Vaccine (5 - 2023-24 season) 06/05/2023    Colorectal cancer screening: Type of screening: Colonoscopy. Completed 12/23/21. Repeat every 10 years  Mammogram status: Ordered  . Pt provided with contact info and advised to call to schedule appt.   Bone Density status: Completed 06/24/23. Results reflect: Bone density results: NORMAL. Repeat every 5 years.  Lung Cancer Screening: (Low Dose CT Chest recommended if Age 45-80 years, 20 pack-year currently smoking OR have quit w/in 15years.) does not qualify.   Lung Cancer Screening Referral: nA  Additional Screening:  Hepatitis C Screening: does qualify; Completed 08/11/16  Vision Screening: Recommended annual ophthalmology exams for early detection of glaucoma and other disorders of the eye. Is the patient up to date with their annual eye exam?  Yes  Who is the provider or what is the name of the office  in which the patient attends annual eye exams? Dr.Hecker If pt is not established with a provider, would they like to be referred to a provider to establish care? No .   Dental Screening: Recommended annual dental exams for proper oral hygiene  Diabetic Foot Exam: Diabetic Foot Exam: Completed 11/25/22  Community Resource Referral / Chronic Care Management: CRR required this visit?  No   CCM required this visit?  No     Plan:     I have personally reviewed and noted the following in the patient's chart:   Medical and social history Use of alcohol, tobacco or illicit drugs  Current medications and supplements including opioid prescriptions. Patient is  not currently taking opioid prescriptions. Functional ability and status Nutritional status Physical activity Advanced directives List of other physicians Hospitalizations, surgeries, and ER visits in previous 12 months Vitals Screenings to include cognitive, depression, and falls Referrals and appointments  In addition, I have reviewed and discussed with patient certain preventive protocols, quality metrics, and best practice recommendations. A written personalized care plan for preventive services as well as general preventive health recommendations were provided to patient.     Kandis Fantasia Luis M. [place], [place]   1/61/0960   After Visit Summary: (MyChart) Due to this being a telephonic visit, the after visit summary with patients personalized plan was offered to patient via MyChart   Nurse Notes: Patient states that she has been breaking out in hives for several months; has been managing with otc medications, will discuss further with provider at upcoming appointment.

## 2023-06-28 ENCOUNTER — Telehealth: Payer: Self-pay

## 2023-06-28 NOTE — Telephone Encounter (Signed)
Informed patient her novo nordisk shipment is ready for pickup  5 boxes of ozempic 0.5mg  dose pens are labeled and ready in med room fridge.

## 2023-07-04 DIAGNOSIS — I10 Essential (primary) hypertension: Secondary | ICD-10-CM | POA: Diagnosis not present

## 2023-07-04 DIAGNOSIS — E119 Type 2 diabetes mellitus without complications: Secondary | ICD-10-CM | POA: Diagnosis not present

## 2023-07-07 ENCOUNTER — Ambulatory Visit
Admission: RE | Admit: 2023-07-07 | Discharge: 2023-07-07 | Disposition: A | Discharge status: Home / Self Care | Payer: Medicare HMO | Source: Ambulatory Visit | Attending: Family Medicine | Admitting: Family Medicine

## 2023-07-07 DIAGNOSIS — Z1231 Encounter for screening mammogram for malignant neoplasm of breast: Secondary | ICD-10-CM

## 2023-07-15 ENCOUNTER — Other Ambulatory Visit: Payer: Self-pay

## 2023-07-15 ENCOUNTER — Encounter: Payer: Self-pay | Admitting: Family Medicine

## 2023-07-15 ENCOUNTER — Ambulatory Visit: Payer: Medicare HMO | Admitting: Family Medicine

## 2023-07-15 VITALS — BP 139/80 | HR 88 | Ht 62.0 in | Wt 247.0 lb

## 2023-07-15 DIAGNOSIS — I10 Essential (primary) hypertension: Secondary | ICD-10-CM

## 2023-07-15 DIAGNOSIS — E119 Type 2 diabetes mellitus without complications: Secondary | ICD-10-CM | POA: Diagnosis not present

## 2023-07-15 DIAGNOSIS — Z7985 Long-term (current) use of injectable non-insulin antidiabetic drugs: Secondary | ICD-10-CM | POA: Diagnosis not present

## 2023-07-15 DIAGNOSIS — Z23 Encounter for immunization: Secondary | ICD-10-CM

## 2023-07-15 DIAGNOSIS — R21 Rash and other nonspecific skin eruption: Secondary | ICD-10-CM | POA: Diagnosis not present

## 2023-07-15 LAB — POCT GLYCOSYLATED HEMOGLOBIN (HGB A1C): HbA1c, POC (controlled diabetic range): 6.8 % (ref 0.0–7.0)

## 2023-07-15 MED ORDER — OZEMPIC (1 MG/DOSE) 4 MG/3ML ~~LOC~~ SOPN
1.0000 mg | PEN_INJECTOR | SUBCUTANEOUS | 2 refills | Status: DC
Start: 2023-07-15 — End: 2023-10-11

## 2023-07-15 NOTE — Telephone Encounter (Signed)
Medication shipment given to patient.

## 2023-07-15 NOTE — Progress Notes (Unsigned)
    SUBJECTIVE:   CHIEF COMPLAINT / HPI:   ***  A1c, UACR, BMP  Review meds - metformin. Takes it once a day, but sometimes forgets the evening dose. Takes the evening dose ~1-2 times a week. Hesitant to increase to 1000 in a single dose. - ozempic once a week on Fridays. Still on 0.5mg . Willing to increase.   HTN - In [employer] health program (part of her Medicare program). A nurse calls to monitor her home - Home BP readings are 139/75 - Still taking Enalapril, chlorthalidone, and metoprolol. Last took meds this morning.   Rash - Reports having occasional itchy hives appearing when she eats too much sugar or not eating enough.  - Denies SOB whent his happens. - Takes benadryl and allegra.    PERTINENT  PMH / PSH: ***  OBJECTIVE:   There were no vitals taken for this visit.  ***  ASSESSMENT/PLAN:   No problem-specific Assessment & Plan notes found for this encounter.     Lincoln Brigham, MD [facility]

## 2023-07-15 NOTE — Patient Instructions (Signed)
Good to see you today - Thank you for coming in  Things we discussed today:  1) For your diabetes, your A1c is 6.8%! Good job, your diabetes is now controlled! - Let's increase your Ozempic from 0.5 to 1mg  once a week. You can give 2 shots of the 0.5mg  until you run out. I have sent refills for 1.0mg  shots - Continue taking metformin twice a day. Try to remember to take it consistently  2) Your blood pressure is controlled! Good job!  Come back to see me in 6 months

## 2023-07-17 DIAGNOSIS — R21 Rash and other nonspecific skin eruption: Secondary | ICD-10-CM | POA: Insufficient documentation

## 2023-07-17 LAB — MICROALBUMIN / CREATININE URINE RATIO
Creatinine, Urine: 161.2 mg/dL
Microalb/Creat Ratio: 6 mg/g{creat} (ref 0–29)
Microalbumin, Urine: 10.4 ug/mL

## 2023-07-17 LAB — BASIC METABOLIC PANEL
BUN/Creatinine Ratio: 16 (ref 12–28)
BUN: 18 mg/dL (ref 8–27)
CO2: 20 mmol/L (ref 20–29)
Calcium: 9.5 mg/dL (ref 8.7–10.3)
Chloride: 102 mmol/L (ref 96–106)
Creatinine, Ser: 1.14 mg/dL — ABNORMAL HIGH (ref 0.57–1.00)
Glucose: 143 mg/dL — ABNORMAL HIGH (ref 70–99)
Potassium: 4.4 mmol/L (ref 3.5–5.2)
Sodium: 142 mmol/L (ref 134–144)
eGFR: 52 mL/min/{1.73_m2} — ABNORMAL LOW (ref >59)

## 2023-07-17 NOTE — Assessment & Plan Note (Signed)
Wt stable. Will increase ozempic as above for better diabetes control and weight benefit.

## 2023-07-17 NOTE — Assessment & Plan Note (Signed)
Reports occasional few scattered pruritic bumps on legs that may be related to too high or too low sugars. On exam, does not look characteristically like uticaria. Differential is broad and includes bug bites, irritant dermatitis, allergic reaction. Low c/f infection such as cellulitis given nontender, no drainage, and mild appearance of rash. Low c/f anaphylactic reactions. - Recommended to take benadryl or allegra as needed for itchiness - Recommended to followup if rash does not improve - Strict ED precautions discussed

## 2023-07-17 NOTE — Assessment & Plan Note (Signed)
A1c 6.8, at goal and improved from prior. Reports sometimes forgetting doses fo evening metformin dose, but does not want to consolidate into a single dose because she had GI upset in past from higher doses. Is tolerating Ozempic well without side effect. - Increase Ozempic to 1mg  weekly - Cont metformin

## 2023-07-17 NOTE — Assessment & Plan Note (Signed)
Initially high, butt then wnl on recheck. Home BP readings are at goal. - BMP and UACR - Cont Enalapril, chlorthalidone, and metoprolol

## 2023-07-19 ENCOUNTER — Telehealth: Payer: Self-pay | Admitting: Family Medicine

## 2023-07-19 DIAGNOSIS — N1831 Chronic kidney disease, stage 3a: Secondary | ICD-10-CM

## 2023-07-19 DIAGNOSIS — E119 Type 2 diabetes mellitus without complications: Secondary | ICD-10-CM

## 2023-07-19 MED ORDER — EMPAGLIFLOZIN 10 MG PO TABS
10.0000 mg | ORAL_TABLET | Freq: Every day | ORAL | 1 refills | Status: AC
Start: 2023-07-19 — End: ?

## 2023-07-19 NOTE — Telephone Encounter (Signed)
Called pt regarding lab results. I recommended starting jardiance 10mg  daily for renal protection. Pt agreeable with plan. - start jardiance 10mg  daily - f/u in 1 month, check BMP

## 2023-07-25 DIAGNOSIS — E119 Type 2 diabetes mellitus without complications: Secondary | ICD-10-CM | POA: Diagnosis not present

## 2023-07-25 DIAGNOSIS — H04123 Dry eye syndrome of bilateral lacrimal glands: Secondary | ICD-10-CM | POA: Diagnosis not present

## 2023-07-25 DIAGNOSIS — H524 Presbyopia: Secondary | ICD-10-CM | POA: Diagnosis not present

## 2023-07-25 DIAGNOSIS — H35361 Drusen (degenerative) of macula, right eye: Secondary | ICD-10-CM | POA: Diagnosis not present

## 2023-07-25 DIAGNOSIS — H26493 Other secondary cataract, bilateral: Secondary | ICD-10-CM | POA: Diagnosis not present

## 2023-07-29 ENCOUNTER — Telehealth: Payer: Self-pay

## 2023-07-29 NOTE — Telephone Encounter (Signed)
Patient calls nurse line in regards to [place].   She reports she has not been able to pick this up at the pharmacy.   I called [facility] and the prescription is running through for 170 dollars. [facility] reports her insurance covers the medication, therefore a PA would not be beneficial.   She reports she receives Ozempic through medication assistance and is curious if she can obtain Jardiance through them as well.   Advised will forward to pharmacy team for assistance.

## 2023-08-02 NOTE — Telephone Encounter (Signed)
Will reach out to patient and mail application for possible Jardiance assistance with [employer].

## 2023-08-04 DIAGNOSIS — E119 Type 2 diabetes mellitus without complications: Secondary | ICD-10-CM | POA: Diagnosis not present

## 2023-08-04 DIAGNOSIS — I10 Essential (primary) hypertension: Secondary | ICD-10-CM | POA: Diagnosis not present

## 2023-08-05 NOTE — Telephone Encounter (Signed)
Yes, it will be needed.   [employer] applications are currently taking over 3 weeks to process once submitted.

## 2023-08-09 NOTE — Telephone Encounter (Signed)
Patient contacted for follow-up of request for help with Jardiance (empagliflozin) 10mg  Patient needs help with assistance during transition to coverage with [employer] cares - MAP application.   Medication Samples have been provided to the patient.  Drug name: Jardiance (empagliflozin)       Strength: 10mg         Qty: 28 tabs  LOT: [id number]  Exp.Date: 11/03/2024  Dosing instructions: one daily  The patient has been instructed regarding the correct time, dose, and frequency of taking this medication, including desired effects and most common side effects.   Madelon Lips 2:06 PM 08/09/2023   Total time with patient call and documentation of interaction: 13 minutes.  Phone call interaction/communication:  Shona Simpson, PharmD Candidate

## 2023-08-10 ENCOUNTER — Telehealth: Payer: Self-pay

## 2023-08-10 NOTE — Telephone Encounter (Signed)
Reviewed and agree with Dr Koval's plan.   

## 2023-08-10 NOTE — Telephone Encounter (Signed)
Patient LVM on nurse line requesting for PCP to call her daughter Lawanna Kobus.   She reports Lawanna Kobus has some additional questions regarding Jardiance and her "kidneys."   It appears patient was started on Jardiance to help protect against kidney function. She reports she told her daughter this and this information alarmed her.  Will forward to PCP.   [phone]

## 2023-08-10 NOTE — Telephone Encounter (Signed)
Mailed application to patients home.

## 2023-08-12 LAB — HM DIABETES EYE EXAM

## 2023-08-16 NOTE — Telephone Encounter (Signed)
Called daughter Lawanna Kobus, explaining that pt has CKD and T2DM so jardiance can help protect her kidney function. Ultimately, our goal is to prevent her from needing dialysis later down the line. I do not think pt is close to needing dialysis at this time, this is more of a preventative measure.

## 2023-08-25 DIAGNOSIS — E119 Type 2 diabetes mellitus without complications: Secondary | ICD-10-CM | POA: Diagnosis not present

## 2023-08-25 DIAGNOSIS — I10 Essential (primary) hypertension: Secondary | ICD-10-CM | POA: Diagnosis not present

## 2023-09-03 DIAGNOSIS — I10 Essential (primary) hypertension: Secondary | ICD-10-CM | POA: Diagnosis not present

## 2023-09-03 DIAGNOSIS — E119 Type 2 diabetes mellitus without complications: Secondary | ICD-10-CM | POA: Diagnosis not present

## 2023-09-15 ENCOUNTER — Other Ambulatory Visit: Payer: Self-pay

## 2023-09-15 ENCOUNTER — Other Ambulatory Visit: Payer: Self-pay | Admitting: Family Medicine

## 2023-09-15 DIAGNOSIS — E119 Type 2 diabetes mellitus without complications: Secondary | ICD-10-CM

## 2023-09-15 DIAGNOSIS — I10 Essential (primary) hypertension: Secondary | ICD-10-CM | POA: Diagnosis not present

## 2023-09-18 MED ORDER — METFORMIN HCL ER 500 MG PO TB24: 500.0000 mg | ORAL_TABLET | Freq: Two times a day (BID) | ORAL | 3 refills | Status: DC

## 2023-09-23 NOTE — Telephone Encounter (Signed)
 Results abstracted and care team is up to date.

## 2023-09-29 ENCOUNTER — Ambulatory Visit: Payer: Medicare HMO | Admitting: Family Medicine

## 2023-10-04 ENCOUNTER — Other Ambulatory Visit: Payer: Self-pay | Admitting: Family Medicine

## 2023-10-04 DIAGNOSIS — E119 Type 2 diabetes mellitus without complications: Secondary | ICD-10-CM

## 2023-10-04 DIAGNOSIS — I10 Essential (primary) hypertension: Secondary | ICD-10-CM | POA: Diagnosis not present

## 2023-10-10 ENCOUNTER — Ambulatory Visit: Payer: Medicare HMO | Admitting: Family Medicine

## 2023-10-11 ENCOUNTER — Ambulatory Visit (INDEPENDENT_AMBULATORY_CARE_PROVIDER_SITE_OTHER): Payer: Medicare HMO | Admitting: Family Medicine

## 2023-10-11 ENCOUNTER — Telehealth: Payer: Self-pay

## 2023-10-11 ENCOUNTER — Encounter: Payer: Self-pay | Admitting: Family Medicine

## 2023-10-11 DIAGNOSIS — E119 Type 2 diabetes mellitus without complications: Secondary | ICD-10-CM

## 2023-10-11 MED ORDER — OZEMPIC (1 MG/DOSE) 4 MG/3ML ~~LOC~~ SOPN: 1.0000 mg | PEN_INJECTOR | SUBCUTANEOUS | 2 refills | Status: AC

## 2023-10-11 NOTE — Patient Instructions (Signed)
 Good to see you today - Thank you for coming in  Things we discussed today:  1) I am going to give you a 7-day sample of Jardiance .  We will probably need to wait until the medication assistance program is approved with [employer].  2) we will check her A1c and cholesterol levels today.  If they are abnormal, we will reach out to you.  Please always bring your medication bottles  Come back to see me in 3 months.

## 2023-10-11 NOTE — Progress Notes (Signed)
    SUBJECTIVE:   CHIEF COMPLAINT / HPI:   LH is a 68yo F w/ hx of HTN and HLD, T2DM that f/u for A1c  T2DM  HTN - Taking 1mg  weekly of ozempic , reports tolerating this well - Feels generally better since increasing her dose of Ozempic  - Needs assistance with Jardiance  copay, reports that copay is high.  She has filled out the pharmacy assistance form with [employer] and is awaiting this processing. - Currently out of ozempic  and jardiance   OBJECTIVE:   BP 130/63   Pulse 73   Wt 240 lb (108.9 kg)   SpO2 98%   BMI 43.90 kg/m   General: Alert, pleasant woman. NAD. HEENT: NCAT. MMM. CV: RRR, no murmurs.  Resp: CTAB, no wheezing or crackles. Normal WOB on RA.  Abm: Soft, nontender, nondistended. BS present. Ext: Moves all ext spontaneously Skin: Warm, well perfused   ASSESSMENT/PLAN:   Assessment & Plan Diabetes mellitus without complication (HCC) Tolerating increase in ozempic  well. However pt currently awaiting patient assistance approval and is out of jardiance  and ozempic . Provided office sample for 7 days of jardiance .  - Will check BMP once on jardiance  again - Pt will wait for lab visit later this week for lipid panel, BMP, and a1c   Twyla Nearing, MD [facility]

## 2023-10-11 NOTE — Telephone Encounter (Signed)
 Patient calls nurse line regarding increased dosage of Ozempic .   She has been receiving medication assistance for this medication.   She is asking that I send a message to [facility] to notify of increase to 1 mg Ozempic .   Forwarding to [place].   Chiquita JAYSON English, RN

## 2023-10-12 ENCOUNTER — Telehealth: Payer: Self-pay

## 2023-10-12 NOTE — Telephone Encounter (Addendum)
 Submitting patient renewal for Ozempic online with novo nordisk.   New encounter created.

## 2023-10-12 NOTE — Assessment & Plan Note (Signed)
 Tolerating increase in ozempic  well. However pt currently awaiting patient assistance approval and is out of jardiance  and ozempic . Provided office sample for 7 days of jardiance .  - Will check BMP once on jardiance  again - Pt will wait for lab visit later this week for lipid panel, BMP, and a1c

## 2023-10-12 NOTE — Progress Notes (Signed)
 Pharmacy Medication Assistance Program Note    10/24/2023  Patient ID: [name], female   DOB: 05-Nov-1955, 68 y.o.   MRN: [id number]     08/10/2023 10/12/2023  Outreach Medication One  Initial Outreach Date (Medication One) 07/29/2023   Manufacturer Medication One [employer]  [employer] Jardiance  Jardiance   Dose of Jardiance   10mg   Type of Forensic Scientist Assistance  Date Application Sent to Patient 08/10/2023   Application Items Requested Application;Proof of Income   Date Application Received From Patient  10/11/2023  Application Items Received From Patient  Application  Date Application Submitted to Manufacturer  10/18/2023  Method Application Sent to Manufacturer  Fax  Patient Assistance Determination  Approved  Approval Start Date  10/20/2023  Approval End Date  10/03/2024

## 2023-10-13 ENCOUNTER — Other Ambulatory Visit (HOSPITAL_COMMUNITY): Payer: Self-pay

## 2023-10-13 ENCOUNTER — Telehealth: Payer: Self-pay

## 2023-10-13 ENCOUNTER — Other Ambulatory Visit (INDEPENDENT_AMBULATORY_CARE_PROVIDER_SITE_OTHER): Payer: Medicare HMO

## 2023-10-13 DIAGNOSIS — E119 Type 2 diabetes mellitus without complications: Secondary | ICD-10-CM

## 2023-10-13 LAB — POCT GLYCOSYLATED HEMOGLOBIN (HGB A1C): HbA1c, POC (controlled diabetic range): 6.4 % (ref 0.0–7.0)

## 2023-10-13 NOTE — Telephone Encounter (Signed)
 Unable to enroll patient online due to new medicare insurance (site not updated). Will mail application to patients home.

## 2023-10-14 LAB — BASIC METABOLIC PANEL
BUN/Creatinine Ratio: 17 (ref 12–28)
BUN: 25 mg/dL (ref 8–27)
CO2: 25 mmol/L (ref 20–29)
Calcium: 9.4 mg/dL (ref 8.7–10.3)
Chloride: 104 mmol/L (ref 96–106)
Creatinine, Ser: 1.45 mg/dL — ABNORMAL HIGH (ref 0.57–1.00)
Glucose: 219 mg/dL — ABNORMAL HIGH (ref 70–99)
Potassium: 4.7 mmol/L (ref 3.5–5.2)
Sodium: 146 mmol/L — ABNORMAL HIGH (ref 134–144)
eGFR: 39 mL/min/{1.73_m2} — ABNORMAL LOW (ref >59)

## 2023-10-14 LAB — LIPID PANEL
Chol/HDL Ratio: 2.8 {ratio} (ref 0.0–4.4)
Cholesterol, Total: 198 mg/dL (ref 100–199)
HDL: 71 mg/dL (ref >39)
LDL Chol Calc (NIH): 100 mg/dL — ABNORMAL HIGH (ref 0–99)
Triglycerides: 157 mg/dL — ABNORMAL HIGH (ref 0–149)
VLDL Cholesterol Cal: 27 mg/dL (ref 5–40)

## 2023-10-19 NOTE — Progress Notes (Signed)
 Pharmacy Medication Assistance Program Note    11/25/2023  Patient ID: [name], female  DOB: [birth year], 68 y.o.  MRN:  [id number]     10/13/2023  Outreach Medication Two  Manufacturer Medication Two Novo Nordisk  [facility] Drugs Ozempic  Dose of Ozempic 1MG   Type of Radiographer, therapeutic Assistance  Date Application Sent to Patient 10/19/2023  Date Application Received From Patient 11/01/2023  Application Items Received From Patient Application  Date Application Received From Provider 11/22/2023  Method Application Sent to Manufacturer Fax  Date Application Submitted to Manufacturer 11/22/2023  Patient Assistance Determination Approved  Approval Start Date 11/24/2023    ENROLLED UNTIL 10/03/24

## 2023-11-04 DIAGNOSIS — E119 Type 2 diabetes mellitus without complications: Secondary | ICD-10-CM | POA: Diagnosis not present

## 2023-11-04 DIAGNOSIS — I10 Essential (primary) hypertension: Secondary | ICD-10-CM | POA: Diagnosis not present

## 2023-12-02 DIAGNOSIS — I10 Essential (primary) hypertension: Secondary | ICD-10-CM | POA: Diagnosis not present

## 2023-12-02 DIAGNOSIS — E119 Type 2 diabetes mellitus without complications: Secondary | ICD-10-CM | POA: Diagnosis not present

## 2023-12-02 NOTE — Telephone Encounter (Signed)
 Patient calls nurse line requesting that I send message to [place] regarding status of patient assistance.   Advised of most updated note from [place]. She is asking about an ETA for shipment of Ozempic.   Will forward to [place] for further assistance.   Veronda Prude, RN

## 2023-12-19 ENCOUNTER — Other Ambulatory Visit: Payer: Self-pay | Admitting: Family Medicine

## 2023-12-19 DIAGNOSIS — I10 Essential (primary) hypertension: Secondary | ICD-10-CM

## 2023-12-30 NOTE — Telephone Encounter (Signed)
Patient presents to clinic to pick up [place]. Provided with medication per note from [place].  Talbot Grumbling, RN

## 2024-01-02 DIAGNOSIS — I10 Essential (primary) hypertension: Secondary | ICD-10-CM | POA: Diagnosis not present

## 2024-01-02 DIAGNOSIS — E119 Type 2 diabetes mellitus without complications: Secondary | ICD-10-CM | POA: Diagnosis not present

## 2024-01-06 ENCOUNTER — Telehealth: Payer: Self-pay

## 2024-01-06 NOTE — Telephone Encounter (Signed)
 [facility] calls nurse line in regards to Metoprolol prescription.   She reports the direction state to take two tablets daily, however the quantity is #90.   Please send 3 month supply for insurance purposes.

## 2024-01-09 ENCOUNTER — Other Ambulatory Visit: Payer: Self-pay | Admitting: Family Medicine

## 2024-01-09 DIAGNOSIS — K219 Gastro-esophageal reflux disease without esophagitis: Secondary | ICD-10-CM

## 2024-01-09 DIAGNOSIS — E1169 Type 2 diabetes mellitus with other specified complication: Secondary | ICD-10-CM

## 2024-01-09 DIAGNOSIS — I10 Essential (primary) hypertension: Secondary | ICD-10-CM

## 2024-01-09 MED ORDER — METOPROLOL SUCCINATE ER 100 MG PO TB24: 200.0000 mg | ORAL_TABLET | Freq: Every day | ORAL | 11 refills | Status: AC

## 2024-01-12 ENCOUNTER — Ambulatory Visit: Admitting: Family Medicine

## 2024-01-12 ENCOUNTER — Encounter: Payer: Self-pay | Admitting: Family Medicine

## 2024-01-12 VITALS — BP 134/75 | HR 87 | Ht 62.0 in | Wt 238.6 lb

## 2024-01-12 DIAGNOSIS — G8929 Other chronic pain: Secondary | ICD-10-CM | POA: Diagnosis not present

## 2024-01-12 DIAGNOSIS — E119 Type 2 diabetes mellitus without complications: Secondary | ICD-10-CM

## 2024-01-12 DIAGNOSIS — Z7985 Long-term (current) use of injectable non-insulin antidiabetic drugs: Secondary | ICD-10-CM

## 2024-01-12 DIAGNOSIS — M25562 Pain in left knee: Secondary | ICD-10-CM | POA: Diagnosis not present

## 2024-01-12 DIAGNOSIS — B351 Tinea unguium: Secondary | ICD-10-CM | POA: Diagnosis not present

## 2024-01-12 LAB — POCT GLYCOSYLATED HEMOGLOBIN (HGB A1C): HbA1c, POC (controlled diabetic range): 6.7 % (ref 0.0–7.0)

## 2024-01-12 MED ORDER — CICLOPIROX 8 % EX SOLN: Freq: Every day | CUTANEOUS | 2 refills | Status: AC

## 2024-01-12 NOTE — Patient Instructions (Signed)
 Good to see you today - Thank you for coming in  Things we discussed today:  1) Your A1c today is 6.7, which is well controlled! - Continue taking your ozempic, jardiance, metformin  2) For your knee pain, please reach out to Dr. Roda Shutters to discuss surgery options. You have made good progress on your A1c and weight.  3) For your toenail fungus, apply cyclopirox once a day to your toenails. Do not wear nail polish while you do this.  4) Make sure you check for cuts and wounds on your feet every day. Look between your toes and under your foot. If you notice a cut, then wash it with soapy water, apply neosporin, and cover with a bandage. If you are worried about an infection, please come in for me to check out the wound.  Uncontrolled foot infections is what leads to amputation.   Come back to see me in 3-6 months

## 2024-01-12 NOTE — Assessment & Plan Note (Signed)
 Unable to tolerate oral terbinafine.  Will start topical ciclopirox lacquer daily for 12 months.

## 2024-01-12 NOTE — Assessment & Plan Note (Signed)
 A1c 6.7 well-controlled.  Foot exam notable for onychomycosis.  Counseled on regular foot exams and diabetic foot wound precautions. -Continue metformin, Jardiance, Ozempic

## 2024-01-12 NOTE — Progress Notes (Signed)
    SUBJECTIVE:   CHIEF COMPLAINT / HPI:   [name] is a 68yo F w/ hx of T2DM, HTN that pf T2DM f/u. - Was restarted on jardiance last time. Pt was having insurance difficulty.  - Reports being out of ozempic for a little while.  - Is back on jardiance as well.  - Reports   L knee pain - Is interested in going to Ortho to discuss L knee surgery  Onychomycosis - Previously attempted terbinafine, but was unable to tolerate due to GI side effects.  Supplements that she is taking: - tumeric 1000 BID - cinnamon 1000 BID - VitD3 daily  OBJECTIVE:   BP 134/75   Pulse 87   Ht 5\' 2"  (1.575 m)   Wt 238 lb 9.6 oz (108.2 kg)   SpO2 100%   BMI 43.64 kg/m   General: Alert, pleasant well-appearing woman. NAD. HEENT: NCAT. MMM. CV: RRR, no murmurs.  Resp: CTAB, no wheezing or crackles. Normal WOB on RA.  Abm: Soft, nontender, nondistended. BS present. Ext: Moves all ext spontaneously.  Thickened, dark discolored toenails on bilateral feet.  No wounds.  Sensation intact in bilateral feet.  Foot exam documented. Skin: Warm, well perfused   ASSESSMENT/PLAN:   Assessment & Plan Diabetes mellitus without complication (HCC) A1c 6.7 well-controlled.  Foot exam notable for onychomycosis.  Counseled on regular foot exams and diabetic foot wound precautions. -Continue metformin, Jardiance, Ozempic Onychomycosis Unable to tolerate oral terbinafine.  Will start topical ciclopirox lacquer daily for 12 months. Chronic pain of left knee Hx of L knee OA, was previously evaluated for surgery w/ ortho. Pt A1c controlled and has lost weight, so may be a better candidate now for surgery. Advised to f/u with Ortho for further eval.    Lincoln Brigham, MD [facility]

## 2024-01-13 ENCOUNTER — Encounter: Payer: Self-pay | Admitting: Family Medicine

## 2024-01-13 DIAGNOSIS — E119 Type 2 diabetes mellitus without complications: Secondary | ICD-10-CM | POA: Diagnosis not present

## 2024-01-13 DIAGNOSIS — I10 Essential (primary) hypertension: Secondary | ICD-10-CM | POA: Diagnosis not present

## 2024-01-17 MED ORDER — ROSUVASTATIN CALCIUM 40 MG PO TABS: ORAL_TABLET | ORAL | 11 refills | Status: AC

## 2024-01-17 MED ORDER — OMEPRAZOLE 20 MG PO CPDR: DELAYED_RELEASE_CAPSULE | ORAL | 11 refills | Status: AC

## 2024-01-17 NOTE — Addendum Note (Signed)
 Addended by: Lateya Dauria C on: 01/17/2024 09:09 AM   Modules accepted: Orders

## 2024-01-17 NOTE — Telephone Encounter (Signed)
 Received call from [facility] stating that they never received prescriptions for Omeprazole and Rosuvastatin.   Per chart review, these were sent over on 01/09/24. Resent prescription electronically.   Elsie Halo, RN

## 2024-02-01 DIAGNOSIS — E119 Type 2 diabetes mellitus without complications: Secondary | ICD-10-CM | POA: Diagnosis not present

## 2024-02-01 DIAGNOSIS — I10 Essential (primary) hypertension: Secondary | ICD-10-CM | POA: Diagnosis not present

## 2024-02-12 DIAGNOSIS — I10 Essential (primary) hypertension: Secondary | ICD-10-CM | POA: Diagnosis not present

## 2024-02-12 DIAGNOSIS — E119 Type 2 diabetes mellitus without complications: Secondary | ICD-10-CM | POA: Diagnosis not present

## 2024-02-21 DIAGNOSIS — E119 Type 2 diabetes mellitus without complications: Secondary | ICD-10-CM | POA: Diagnosis not present

## 2024-02-21 DIAGNOSIS — I10 Essential (primary) hypertension: Secondary | ICD-10-CM | POA: Diagnosis not present

## 2024-03-02 ENCOUNTER — Other Ambulatory Visit: Payer: Self-pay | Admitting: Family Medicine

## 2024-03-02 DIAGNOSIS — I10 Essential (primary) hypertension: Secondary | ICD-10-CM

## 2024-03-03 DIAGNOSIS — I10 Essential (primary) hypertension: Secondary | ICD-10-CM | POA: Diagnosis not present

## 2024-03-03 DIAGNOSIS — E119 Type 2 diabetes mellitus without complications: Secondary | ICD-10-CM | POA: Diagnosis not present

## 2024-03-13 DIAGNOSIS — E119 Type 2 diabetes mellitus without complications: Secondary | ICD-10-CM | POA: Diagnosis not present

## 2024-03-13 DIAGNOSIS — I10 Essential (primary) hypertension: Secondary | ICD-10-CM | POA: Diagnosis not present

## 2024-03-27 ENCOUNTER — Telehealth: Payer: Self-pay

## 2024-03-27 NOTE — Telephone Encounter (Signed)
 Rec'd 4 boxes of Ozempic  1mg  dose pens from [facility].

## 2024-04-02 DIAGNOSIS — I10 Essential (primary) hypertension: Secondary | ICD-10-CM | POA: Diagnosis not present

## 2024-04-02 DIAGNOSIS — E119 Type 2 diabetes mellitus without complications: Secondary | ICD-10-CM | POA: Diagnosis not present

## 2024-04-03 NOTE — Telephone Encounter (Signed)
 Left message informing patient of shipment being ready for pickup.

## 2024-05-03 DIAGNOSIS — I10 Essential (primary) hypertension: Secondary | ICD-10-CM | POA: Diagnosis not present

## 2024-05-03 DIAGNOSIS — E119 Type 2 diabetes mellitus without complications: Secondary | ICD-10-CM | POA: Diagnosis not present

## 2024-05-08 NOTE — Telephone Encounter (Signed)
 Patient presents to clinic for shipment. Provided medication shipment per note from [facility].   Cheyenne JAYSON English, RN

## 2024-05-12 DIAGNOSIS — I10 Essential (primary) hypertension: Secondary | ICD-10-CM | POA: Diagnosis not present

## 2024-05-12 DIAGNOSIS — E119 Type 2 diabetes mellitus without complications: Secondary | ICD-10-CM | POA: Diagnosis not present

## 2024-06-03 DIAGNOSIS — E119 Type 2 diabetes mellitus without complications: Secondary | ICD-10-CM | POA: Diagnosis not present

## 2024-06-03 DIAGNOSIS — I10 Essential (primary) hypertension: Secondary | ICD-10-CM | POA: Diagnosis not present

## 2024-06-07 ENCOUNTER — Other Ambulatory Visit: Payer: Self-pay | Admitting: Family Medicine

## 2024-06-07 DIAGNOSIS — I10 Essential (primary) hypertension: Secondary | ICD-10-CM

## 2024-06-11 DIAGNOSIS — E119 Type 2 diabetes mellitus without complications: Secondary | ICD-10-CM | POA: Diagnosis not present

## 2024-06-11 DIAGNOSIS — I10 Essential (primary) hypertension: Secondary | ICD-10-CM | POA: Diagnosis not present

## 2024-06-12 ENCOUNTER — Telehealth: Payer: Self-pay

## 2024-06-12 NOTE — Telephone Encounter (Signed)
 In process of completing Novo Nord[facility] refills for patients OZEMPIC  1MG  DOSE PENS medication.  Application placed in Dr. Curry box for signature.

## 2024-06-19 NOTE — Telephone Encounter (Signed)
 Faxed completed refill form to [facility].

## 2024-06-28 ENCOUNTER — Ambulatory Visit: Payer: Medicare HMO

## 2024-06-28 VITALS — Ht 62.0 in | Wt 238.0 lb

## 2024-06-28 DIAGNOSIS — Z0000 Encounter for general adult medical examination without abnormal findings: Secondary | ICD-10-CM

## 2024-06-28 NOTE — Progress Notes (Signed)
 Because this visit was a virtual/telehealth visit,  certain criteria was not obtained, such a blood pressure, CBG if applicable, and timed get up and go. Any medications not marked as taking were not mentioned during the medication reconciliation part of the visit. Any vitals not documented were not able to be obtained due to this being a telehealth visit or patient was unable to self-report a recent blood pressure reading due to a lack of equipment at home via telehealth. Vitals that have been documented are verbally provided by the patient.   Subjective:   [name] is a 68 y.o. who presents for a Medicare Wellness preventive visit.  As a reminder, Annual Wellness Visits don't include a physical exam, and some assessments may be limited, especially if this visit is performed virtually. We may recommend an in-person follow-up visit with your provider if needed.  Visit Complete: Virtual I connected with  Shatyra Hunter Hopkins on 06/28/24 by a audio enabled telemedicine application and verified that I am speaking with the correct person using two identifiers.  Patient Location: Home  Provider Location: Office/Clinic  I discussed the limitations of evaluation and management by telemedicine. The patient expressed understanding and agreed to proceed.  Vital Signs: Because this visit was a virtual/telehealth visit, some criteria may be missing or patient reported. Any vitals not documented were not able to be obtained and vitals that have been documented are patient reported.  VideoError- Librarian, academic were attempted between this provider and patient, however failed, due to patient having technical difficulties OR patient did not have access to video capability.  We continued and completed visit with audio only.   Persons Participating in Visit: Patient.  AWV Questionnaire: Yes: Patient Medicare AWV questionnaire was completed by the patient on  06/28/2024; I have confirmed that all information answered by patient is correct and no changes since this date.  Cardiac Risk Factors include: advanced age (>57men, >18 women);diabetes mellitus;dyslipidemia;hypertension;obesity (BMI >30kg/m2);sedentary lifestyle     Objective:    Today's Vitals   06/28/24 1036  Weight: 238 lb (108 kg)  Height: 5' 2 (1.575 m)  PainSc: 8   PainLoc: Neck   Body mass index is 43.53 kg/m.     06/28/2024   10:44 AM 10/11/2023    3:06 PM 07/15/2023    2:26 PM 06/27/2023    2:17 PM 11/25/2022    1:38 PM 07/26/2022    1:30 PM 05/31/2022    2:41 PM  Advanced Directives  Does Patient Have a Medical Advance Directive? No No No No No No No  Would patient like information on creating a medical advance directive? No - Patient declined No - Patient declined No - Patient declined Yes (MAU/Ambulatory/Procedural Areas - Information given) No - Patient declined No - Patient declined     Current Medications (verified) Outpatient Encounter Medications as of 06/28/2024  Medication Sig   acetaminophen  (TYLENOL ) 650 MG CR tablet Take 650 mg by mouth every 8 (eight) hours as needed for pain.   Blood Glucose Calibration (TRUE METRIX LEVEL 1) Low SOLN Use to calibrate glucose meter as needed   Blood Glucose Monitoring Suppl (TRUE METRIX AIR GLUCOSE METER) w/Device KIT USE AS DIRECTED   chlorthalidone  (HYGROTON ) 25 MG tablet TAKE ONE TABLET BY MOUTH DAILY AT 9AM   cholecalciferol (VITAMIN D3) 25 MCG (1000 UNIT) tablet Take 1,000 Units by mouth daily.   ciclopirox  (PENLAC ) 8 % solution Apply topically at bedtime. Apply over nail and surrounding skin.  Apply daily over previous coat. After seven (7) days, may remove with alcohol and continue cycle.   Cinnamon 500 MG TABS Take 1,000 mg by mouth 2 (two) times daily.   co-enzyme Q-10 30 MG capsule Take 100 mg by mouth 2 (two) times daily.    diclofenac  sodium (VOLTAREN ) 1 % GEL Apply 2 g topically 4 (four) times daily.    DULoxetine  (CYMBALTA ) 60 MG capsule Take 1 capsule (60 mg total) by mouth daily.   empagliflozin  (JARDIANCE ) 10 MG TABS tablet Take 1 tablet (10 mg total) by mouth daily.   enalapril  (VASOTEC ) 20 MG tablet TAKE ONE TABLET BY MOUTH DAILY AT 9AM   glucose blood (TRUE METRIX BLOOD GLUCOSE TEST) test strip Use as instructed   metFORMIN  (GLUCOPHAGE -XR) 500 MG 24 hr tablet Take 1 tablet (500 mg total) by mouth in the morning and at bedtime.   metoprolol  succinate (TOPROL -XL) 100 MG 24 hr tablet Take 2 tablets (200 mg total) by mouth daily. Take with or immediately following a meal.   omeprazole  (PRILOSEC) 20 MG capsule TAKE TWO CAPSULES BY MOUTH DAILY AT 9AM   rosuvastatin  (CRESTOR ) 40 MG tablet TAKE ONE TABLET BY MOUTH DAILY AT 5PM   Semaglutide , 1 MG/DOSE, (OZEMPIC , 1 MG/DOSE,) 4 MG/3ML SOPN Inject 1 mg into the skin once a week.   traMADol  (ULTRAM ) 50 MG tablet Take 1-2 tablets (50-100 mg total) by mouth daily as needed.   traMADol  (ULTRAM ) 50 MG tablet Take 1-2 tablets (50-100 mg total) by mouth every 12 (twelve) hours as needed.   traZODone  (DESYREL ) 50 MG tablet Take 1 tablet (50 mg total) by mouth at bedtime as needed for sleep. 1-2 tabs by mouth at bedtime as needed for sleep 1-2 tabs by mouth at bedtime as needed for sleep   TRUEplus Lancets 33G MISC Use to check blood sugar daily or as needed   Turmeric (QC TUMERIC COMPLEX PO) Take 1,000 mg by mouth 2 (two) times daily.   No facility-administered encounter medications on file as of 06/28/2024.    Allergies (verified) Amoxicillin, Lovastatin, and Penicillins   History: Past Medical History:  Diagnosis Date   Allergy not sure   Arthritis 2013   Cataract 2015   removed 2022   Diabetes mellitus without complication (HCC)    GERD (gastroesophageal reflux disease)    Hypertension    Past Surgical History:  Procedure Laterality Date   DILATATION & CURETTAGE/HYSTEROSCOPY WITH MYOSURE N/A 08/26/2015   Procedure: DILATATION &  CURETTAGE/HYSTEROSCOPY WITH MYOSURE;  Surgeon: Curlee VEAR Guan, MD;  Location: [facility];  Service: Gynecology;  Laterality: N/A;   EYE SURGERY  2022   caterects   TUBAL LIGATION     Family History  Problem Relation Age of Onset   Heart disease Mother    Hypertension Mother    Kidney disease Mother    Hypertension Father    Heart disease Father    Hypertension Sister    Hypertension Brother    Social History   Socioeconomic History   Marital status: Widowed    Spouse name: Not on file   Number of children: Not on file   Years of education: Not on file   Highest education level: Bachelor's degree (e.g., BA, AB, BS)  Occupational History   Not on file  Tobacco Use   Smoking status: Former    Current packs/day: 0.00    Average packs/day: 1 pack/day for 10.0 years (10.0 ttl pk-yrs)    Types: Cigarettes    Quit  date: 06/04/1996    Years since quitting: 28.0    Passive exposure: Past   Smokeless tobacco: Never  Substance and Sexual Activity   Alcohol use: Yes    Alcohol/week: 1.0 - 2.0 standard drink of alcohol    Types: 1 - 2 Glasses of wine per week   Drug use: Never   Sexual activity: Not Currently    Birth control/protection: None  Other Topics Concern   Not on file  Social History Narrative   Not on file   Social Drivers of Health   Financial Resource Strain: Low Risk  (06/28/2024)   Overall Financial Resource Strain (CARDIA)    Difficulty of Paying Living Expenses: Not hard at all  Food Insecurity: No Food Insecurity (06/28/2024)   Hunger Vital Sign    Worried About Running Out of Food in the Last Year: Never true    Ran Out of Food in the Last Year: Never true  Transportation Needs: No Transportation Needs (06/28/2024)   PRAPARE - Administrator, Civil Service (Medical): No    Lack of Transportation (Non-Medical): No  Physical Activity: Insufficiently Active (06/28/2024)   Exercise Vital Sign    Days of Exercise per Week: 1 day    Minutes of Exercise  per Session: 10 min  Stress: No Stress Concern Present (06/28/2024)   [employer] of Occupational Health - Occupational Stress Questionnaire    Feeling of Stress: Not at all  Social Connections: Moderately Integrated (06/28/2024)   Social Connection and Isolation Panel    Frequency of Communication with Friends and Family: Once a week    Frequency of Social Gatherings with Friends and Family: Twice a week    Attends Religious Services: 1 to 4 times per year    Active Member of [employer] or Organizations: Yes    Attends Banker Meetings: 1 to 4 times per year    Marital Status: Widowed    Tobacco Counseling Counseling given: Not Answered    Clinical Intake:  Pre-visit preparation completed: Yes  Pain : 0-10 Pain Score: 8  Pain Type: Chronic pain Pain Location: Neck Pain Orientation: Lower Pain Radiating Towards: RIGHT UPPER EXTREMITIY DOWN TO LOWER ARM Pain Descriptors / Indicators: Aching Pain Onset: More than a month ago Pain Frequency: Constant     BMI - recorded: 43.53 Nutritional Status: BMI > 30  Obese Nutritional Risks: None Diabetes: Yes CBG done?: No Did pt. bring in CBG monitor from home?: No  Lab Results  Component Value Date   HGBA1C 6.7 01/12/2024   HGBA1C 6.4 10/13/2023   HGBA1C 6.8 07/15/2023     How often do you need to have someone help you when you read instructions, pamphlets, or other written materials from your doctor or pharmacy?: 1 - Never What is the last grade level you completed in school?: [employer]REE  Interpreter Needed?: No  Information entered by :: Roz Fuller, LPN.   Activities of Daily Living     06/28/2024   10:54 AM 06/28/2024    9:35 AM  In your present state of health, do you have any difficulty performing the following activities:  Hearing? 0 0  Vision? 0 0  Difficulty concentrating or making decisions? 0 0  Walking or climbing stairs? 1 1  Dressing or bathing? 0 0  Doing errands, shopping?  1 1  Preparing Food and eating ? N N  Using the Toilet? N N  In the past six months, have you accidently leaked urine?  Y Y  Do you have problems with loss of bowel control? N N  Managing your Medications? N N  Managing your Finances? N N  Housekeeping or managing your Housekeeping? CINDERELLA CINDERELLA    Patient Care Team: Elicia Hamlet, MD as PCP - General (Family Medicine) Jerri Kay HERO, MD as Attending Physician (Orthopedic Surgery) Frazier, [place], OD as Consulting Physician (Optometry)  I have updated your Care Teams any recent Medical Services you may have received from other providers in the past year.     Assessment:   This is a routine wellness examination for Lovelee.  Hearing/Vision screen Hearing Screening - Comments:: Denies hearing difficulties.    Vision Screening - Comments:: Wears reading glasses - up to date with routine eye exams with Dr. [place] Frazier at [facility]    Goals Addressed               This Visit's Progress     Patient Stated (pt-stated)        Maintain my health, stay independent and continue to do my senior chair exercises.       Depression Screen     06/28/2024   10:41 AM 01/12/2024   10:06 AM 10/11/2023    3:06 PM 07/15/2023    2:26 PM 06/27/2023    2:15 PM 11/25/2022    1:38 PM 07/26/2022    1:30 PM  PHQ 2/9 Scores  PHQ - 2 Score 0 0 0 0 0 0 0  PHQ- 9 Score 4 7 1 7  3 2     Fall Risk     06/28/2024   10:54 AM 06/28/2024    9:35 AM 01/12/2024   10:06 AM 10/11/2023    3:10 PM 07/15/2023    2:26 PM  Fall Risk   Falls in the past year? 1 1 0 1 0  Number falls in past yr: 0 0 0 0 0  Injury with Fall? 0 0 0 0 0  Risk for fall due to : Impaired balance/gait   Impaired balance/gait;History of fall(s);Impaired mobility;Orthopedic patient   Follow up Falls evaluation completed;Education provided   Falls prevention discussed     MEDICARE RISK AT HOME:  Medicare Risk at Home Any stairs in or around the home?: Yes If so, are there any without  handrails?: No Home free of loose throw rugs in walkways, pet beds, electrical cords, etc?: Yes Adequate lighting in your home to reduce risk of falls?: Yes Life alert?: No Use of a cane, walker or w/c?: Yes Grab bars in the bathroom?: Yes Shower chair or bench in shower?: Yes Elevated toilet seat or a handicapped toilet?: No  TIMED UP AND GO:  Was the test performed?  No  Cognitive Function: 6CIT completed    06/28/2024   10:41 AM  MMSE - Mini Mental State Exam  Not completed: Unable to complete        06/28/2024   10:44 AM 06/27/2023    2:16 PM  6CIT Screen  What Year? 0 points 0 points  What month? 0 points 0 points  What time? 0 points 0 points  Count back from 20 0 points 0 points  Months in reverse 0 points 0 points  Repeat phrase 0 points 0 points  Total Score 0 points 0 points    Immunizations Immunization History  Administered Date(s) Administered   Fluad Quad(high Dose 65+) 10/31/2020, 07/26/2022   Fluad Trivalent(High Dose 65+) 07/15/2023   Influenza Split 08/16/2011, 09/20/2012   Influenza,inj,Quad PF,6+  Mos 07/23/2013, 12/09/2014, 08/11/2016, 07/11/2017, 09/05/2018, 11/09/2019, 07/06/2021   Influenza,inj,quad, With Preservative 11/09/2019   Janssen (J&J) SARS-COV-2 Vaccination 12/13/2019   PFIZER Comirnaty(Gray Top)Covid-19 Tri-Sucrose Vaccine 03/16/2021   PFIZER(Purple Top)SARS-COV-2 Vaccination 07/30/2020   PNEUMOCOCCAL CONJUGATE-20 03/16/2021   Pfizer Covid-19 Vaccine Bivalent Booster 11yrs & up 07/27/2021   Pfizer(Comirnaty)Fall Seasonal Vaccine 12 years and older 07/15/2023   Pneumococcal Polysaccharide-23 08/11/2016   RSV,unspecified 02/19/2023   Td 10/04/1996, 12/13/2008   Tdap 05/10/2019   Zoster Recombinant(Shingrix ) 02/19/2023, 05/03/2023    Screening Tests Health Maintenance  Topic Date Due   Influenza Vaccine  05/04/2024   COVID-19 Vaccine (6 - 2024-25 season) 06/04/2024   Diabetic kidney evaluation - Urine ACR  07/14/2024    HEMOGLOBIN A1C  07/13/2024   OPHTHALMOLOGY EXAM  08/11/2024   Diabetic kidney evaluation - eGFR measurement  10/12/2024   FOOT EXAM  01/11/2025   Medicare Annual Wellness (AWV)  06/28/2025   Mammogram  07/06/2025   DTaP/Tdap/Td (4 - Td or Tdap) 05/09/2029   Colonoscopy  12/24/2031   Pneumococcal Vaccine: 50+ Years  Completed   DEXA SCAN  Completed   Hepatitis C Screening  Completed   Zoster Vaccines- Shingrix   Completed   HPV VACCINES  Aged Out   Meningococcal B Vaccine  Aged Out    Health Maintenance Items Addressed: Yes Patient aware of current care gaps.  Patient is due for Flu and Covid vaccines.  Additional Screening:  Vision Screening: Recommended annual ophthalmology exams for early detection of glaucoma and other disorders of the eye. Is the patient up to date with their annual eye exam?  Yes  Who is the provider or what is the name of the office in which the patient attends annual eye exams? [place] Frazier, [place].  Dental Screening: Recommended annual dental exams for proper oral hygiene  Community Resource Referral / Chronic Care Management: CRR required this visit?  No   CCM required this visit?  No   Plan:    I have personally reviewed and noted the following in the patient's chart:   Medical and social history Use of alcohol, tobacco or illicit drugs  Current medications and supplements including opioid prescriptions. Patient is not currently taking opioid prescriptions. Functional ability and status Nutritional status Physical activity Advanced directives List of other physicians Hospitalizations, surgeries, and ER visits in previous 12 months Vitals Screenings to include cognitive, depression, and falls Referrals and appointments  In addition, I have reviewed and discussed with patient certain preventive protocols, quality metrics, and best practice recommendations. A written personalized care plan for preventive services as well as general preventive  health recommendations were provided to patient.   Roz LOISE Fuller, LPN   [id number]   After Visit Summary: (MyChart) Due to this being a telephonic visit, the after visit summary with patients personalized plan was offered to patient via MyChart   Notes: Nothing significant to report at this time.

## 2024-06-28 NOTE — Patient Instructions (Signed)
 [name],  Thank you for taking the time for your Medicare Wellness Visit. I appreciate your continued commitment to your health goals. Please review the care plan we discussed, and feel free to reach out if I can assist you further.  Medicare recommends these wellness visits once per year to help you and your care team stay ahead of potential health issues. These visits are designed to focus on prevention, allowing your provider to concentrate on managing your acute and chronic conditions during your regular appointments.  Please note that Annual Wellness Visits do not include a physical exam. Some assessments may be limited, especially if the visit was conducted virtually. If needed, we may recommend a separate in-person follow-up with your provider.  Ongoing Care Seeing your primary care provider every 3 to 6 months helps us  monitor your health and provide consistent, personalized care.   Referrals If a referral was made during today's visit and you haven't received any updates within two weeks, please contact the referred provider directly to check on the status.  Recommended Screenings:  Health Maintenance  Topic Date Due   Flu Shot  05/04/2024   COVID-19 Vaccine (6 - 2024-25 season) 06/04/2024   Yearly kidney health urinalysis for diabetes  07/14/2024   Hemoglobin A1C  07/13/2024   Eye exam for diabetics  08/11/2024   Yearly kidney function blood test for diabetes  10/12/2024   Complete foot exam   01/11/2025   Medicare Annual Wellness Visit  06/28/2025   Breast Cancer Screening  07/06/2025   DTaP/Tdap/Td vaccine (4 - Td or Tdap) 05/09/2029   Colon Cancer Screening  12/24/2031   Pneumococcal Vaccine for age over 76  Completed   DEXA scan (bone density measurement)  Completed   Hepatitis C Screening  Completed   Zoster (Shingles) Vaccine  Completed   HPV Vaccine  Aged Out   Meningitis B Vaccine  Aged Out       06/28/2024   10:44 AM  Advanced Directives  Does  Patient Have a Medical Advance Directive? No  Would patient like information on creating a medical advance directive? No - Patient declined   Advance Care Planning is important because it: Ensures you receive medical care that aligns with your values, goals, and preferences. Provides guidance to your family and loved ones, reducing the emotional burden of decision-making during critical moments.  Vision: Annual vision screenings are recommended for early detection of glaucoma, cataracts, and diabetic retinopathy. These exams can also reveal signs of chronic conditions such as diabetes and high blood pressure.  Dental: Annual dental screenings help detect early signs of oral cancer, gum disease, and other conditions linked to overall health, including heart disease and diabetes.  Please see the attached documents for additional preventive care recommendations.

## 2024-07-03 ENCOUNTER — Telehealth: Payer: Self-pay

## 2024-07-03 DIAGNOSIS — I10 Essential (primary) hypertension: Secondary | ICD-10-CM | POA: Diagnosis not present

## 2024-07-03 DIAGNOSIS — E119 Type 2 diabetes mellitus without complications: Secondary | ICD-10-CM | POA: Diagnosis not present

## 2024-07-03 NOTE — Telephone Encounter (Signed)
 4 boxes of Ozempic  1mg  dose pens arrived 06/26/24

## 2024-07-16 ENCOUNTER — Ambulatory Visit (INDEPENDENT_AMBULATORY_CARE_PROVIDER_SITE_OTHER): Admitting: Family Medicine

## 2024-07-16 ENCOUNTER — Encounter: Payer: Self-pay | Admitting: Family Medicine

## 2024-07-16 VITALS — BP 130/84 | HR 67 | Ht 62.0 in | Wt 238.2 lb

## 2024-07-16 DIAGNOSIS — Z23 Encounter for immunization: Secondary | ICD-10-CM | POA: Diagnosis not present

## 2024-07-16 DIAGNOSIS — M25511 Pain in right shoulder: Secondary | ICD-10-CM | POA: Diagnosis not present

## 2024-07-16 DIAGNOSIS — E119 Type 2 diabetes mellitus without complications: Secondary | ICD-10-CM | POA: Diagnosis not present

## 2024-07-16 DIAGNOSIS — G8929 Other chronic pain: Secondary | ICD-10-CM

## 2024-07-16 DIAGNOSIS — M25512 Pain in left shoulder: Secondary | ICD-10-CM

## 2024-07-16 DIAGNOSIS — R7989 Other specified abnormal findings of blood chemistry: Secondary | ICD-10-CM

## 2024-07-16 LAB — POCT GLYCOSYLATED HEMOGLOBIN (HGB A1C): HbA1c, POC (controlled diabetic range): 6.6 % (ref 0.0–7.0)

## 2024-07-16 NOTE — Patient Instructions (Addendum)
 1) Your shoulder pain sounds most like adhesive capsulitis (aka frozen shoulder). Here is an increased risk of this in patients with diabetes. Let's start with an Xray of your shoulders to make sure something else isn't going on. If your Xrays look good, we can discuss other treatment options such as shoulder steroid injections. - I will send a referral for physical therapy. Routine exercise is the best way to restore movement to your joints and improve blood flow to the area. Expect a phone call in the next few weeks to schedule this. - I will also provide a few home exercises for you to do. Try to do them abut 10 minutes per shoulder every day.  2) Your A1c is 6.6, which is amazing. Good job on your control of your diabetes.  3) We will check your kidney labs today. If it is abnormal, I may reach for you to stop taking some of your medications.   4) Come back to see me about 1 week after your Xray is done. We can discuss the results and next steps for treatment.

## 2024-07-16 NOTE — Assessment & Plan Note (Signed)
 A1c at goal. - Cont metformin , jardiance , ozempic 

## 2024-07-16 NOTE — Progress Notes (Signed)
    SUBJECTIVE:   CHIEF COMPLAINT / HPI:   LH is a 68yo F that pf T2DM f/u.  BL arm/shoulder pain - For about 6 months, started to have R bicep pain, then spread to her R shoulder, then spreading down to her middle finger. No other fingers involve. - Pain is sharp. Worsened by certain movements and has a locking sensation in her R shoulder.  - She now feels like it is starting to affect her L shoulder. She is worried that her L arm will start feel that way too.  - Denies any preceding trauma or excessive strain - Pain radiates down her arms.   T2DM - Reports taking her medications without issues. No refills needed currently. - Due for UACR, A1c    PERTINENT  PMH / PSH: T2DM, HTN  OBJECTIVE:   BP 130/84   Pulse 67   Ht 5' 2 (1.575 m)   Wt 238 lb 3.2 oz (108 kg)   SpO2 100%   BMI 43.57 kg/m   General: Alert, pleasant woman. NAD. HEENT: NCAT. MMM. CV: RRR, no murmurs.  Resp: CTAB, no wheezing or crackles. Normal WOB on RA.    Skin: Warm, well perfused  MSK:  - R shoulder abduction ~45 degree, limited by pain. Lifting against resistance worsens pain. Ext rotation limited to ~45 degree. - L shoulder abduction limited to 90 degree, limited by pain, resistance makes pain worse. Ext rotation limited to ~45 degree.  ASSESSMENT/PLAN:   Assessment & Plan Diabetes mellitus without complication (HCC) A1c at goal. - Cont metformin , jardiance , ozempic  Elevated serum creatinine Cr was above baseline in 1/25. C/f AKI vs chronic HTN or T2DM nephropathy. Will recheck BMP today. - If Cr remains elevated, consider holding chlorthalidone  then recheck. Chronic pain of both shoulders Highest on differential is adhesive capsulitis given hx of T2DM, BL nature, and limited passive ROM. Shoulder OA, cervical radiculopathy are also considered; although exam is more cw adhesive capsulitis. Will obtain XR of shoulders rule out other structural etiologies.  - Discussed shoulder ROM exercised  daily - Referral for PT - f/u shoulder XR - f/u in 1 wk. Discussed other treatment options including shoulder steroid injection Encounter for immunization  Encounter for immunization        Twyla Nearing, MD [facility]

## 2024-07-17 ENCOUNTER — Ambulatory Visit: Payer: Self-pay | Admitting: Family Medicine

## 2024-07-17 LAB — BASIC METABOLIC PANEL WITH GFR
BUN/Creatinine Ratio: 19 (ref 12–28)
BUN: 21 mg/dL (ref 8–27)
CO2: 24 mmol/L (ref 20–29)
Calcium: 9.6 mg/dL (ref 8.7–10.3)
Chloride: 99 mmol/L (ref 96–106)
Creatinine, Ser: 1.12 mg/dL — ABNORMAL HIGH (ref 0.57–1.00)
Glucose: 124 mg/dL — ABNORMAL HIGH (ref 70–99)
Potassium: 3.8 mmol/L (ref 3.5–5.2)
Sodium: 139 mmol/L (ref 134–144)
eGFR: 53 mL/min/1.73 — ABNORMAL LOW (ref >59)

## 2024-07-17 LAB — MICROALBUMIN / CREATININE URINE RATIO
Creatinine, Urine: 276.7 mg/dL
Microalb/Creat Ratio: 7 mg/g{creat} (ref 0–29)
Microalbumin, Urine: 19.6 ug/mL

## 2024-07-19 NOTE — Progress Notes (Signed)
 [name]                                          MRN: [id number]   07/19/2024   The [facility]BC[facility] Quality Team Specialist reviewed this patient medical record for the purposes of chart review for care gap closure. The following were reviewed: abstraction for care gap closure-kidney health evaluation for diabetes:eGFR  and uACR.    [facility]BCI Quality Team

## 2024-07-23 ENCOUNTER — Encounter: Payer: Self-pay | Admitting: Family Medicine

## 2024-08-03 DIAGNOSIS — I10 Essential (primary) hypertension: Secondary | ICD-10-CM | POA: Diagnosis not present

## 2024-08-03 DIAGNOSIS — E119 Type 2 diabetes mellitus without complications: Secondary | ICD-10-CM | POA: Diagnosis not present

## 2024-08-09 ENCOUNTER — Ambulatory Visit (HOSPITAL_COMMUNITY)
Admission: RE | Admit: 2024-08-09 | Discharge: 2024-08-09 | Disposition: A | Discharge status: Home / Self Care | Source: Ambulatory Visit | Attending: Family Medicine | Admitting: Family Medicine

## 2024-08-09 DIAGNOSIS — H35361 Drusen (degenerative) of macula, right eye: Secondary | ICD-10-CM | POA: Diagnosis not present

## 2024-08-09 DIAGNOSIS — M19012 Primary osteoarthritis, left shoulder: Secondary | ICD-10-CM | POA: Diagnosis not present

## 2024-08-09 DIAGNOSIS — M25511 Pain in right shoulder: Secondary | ICD-10-CM | POA: Insufficient documentation

## 2024-08-09 DIAGNOSIS — G8929 Other chronic pain: Secondary | ICD-10-CM | POA: Diagnosis not present

## 2024-08-09 DIAGNOSIS — H04123 Dry eye syndrome of bilateral lacrimal glands: Secondary | ICD-10-CM | POA: Diagnosis not present

## 2024-08-09 DIAGNOSIS — M25512 Pain in left shoulder: Secondary | ICD-10-CM | POA: Diagnosis not present

## 2024-08-09 DIAGNOSIS — E119 Type 2 diabetes mellitus without complications: Secondary | ICD-10-CM | POA: Diagnosis not present

## 2024-08-09 DIAGNOSIS — M19011 Primary osteoarthritis, right shoulder: Secondary | ICD-10-CM | POA: Diagnosis not present

## 2024-08-09 DIAGNOSIS — H26493 Other secondary cataract, bilateral: Secondary | ICD-10-CM | POA: Diagnosis not present

## 2024-08-09 LAB — OPHTHALMOLOGY REPORT-SCANNED

## 2024-08-14 ENCOUNTER — Telehealth: Payer: Self-pay

## 2024-08-14 NOTE — Telephone Encounter (Signed)
 Jardiance  patient assistance with [employer] ends 10/03/24.

## 2024-08-20 DIAGNOSIS — I10 Essential (primary) hypertension: Secondary | ICD-10-CM | POA: Diagnosis not present

## 2024-08-20 DIAGNOSIS — E119 Type 2 diabetes mellitus without complications: Secondary | ICD-10-CM | POA: Diagnosis not present

## 2024-08-24 NOTE — Telephone Encounter (Signed)
 I process of mailing [employer] application to patients home.

## 2024-08-28 NOTE — Telephone Encounter (Signed)
 PAP: Patient assistance application for Jardiance  through [employer]) has been mailed to pt's home address on file.

## 2024-08-31 NOTE — Telephone Encounter (Signed)
 Medication was picked up (never noted).

## 2024-09-02 DIAGNOSIS — I10 Essential (primary) hypertension: Secondary | ICD-10-CM | POA: Diagnosis not present

## 2024-09-02 DIAGNOSIS — E119 Type 2 diabetes mellitus without complications: Secondary | ICD-10-CM | POA: Diagnosis not present

## 2024-09-04 ENCOUNTER — Ambulatory Visit: Admitting: Family Medicine

## 2024-09-04 NOTE — Progress Notes (Unsigned)
    SUBJECTIVE:   CHIEF COMPLAINT / HPI:   ***    - Shoulder steroid injection? Ortho referral  PERTINENT  PMH / PSH: ***  OBJECTIVE:   There were no vitals taken for this visit.  ***  ASSESSMENT/PLAN:   Assessment & Plan      Twyla Nearing, MD [facility] [place]

## 2024-09-05 ENCOUNTER — Encounter: Payer: Self-pay | Admitting: Family Medicine

## 2024-09-05 DIAGNOSIS — M19019 Primary osteoarthritis, unspecified shoulder: Secondary | ICD-10-CM | POA: Insufficient documentation

## 2024-09-06 ENCOUNTER — Other Ambulatory Visit: Payer: Self-pay | Admitting: Family Medicine

## 2024-09-06 DIAGNOSIS — M179 Osteoarthritis of knee, unspecified: Secondary | ICD-10-CM

## 2024-09-10 ENCOUNTER — Ambulatory Visit: Admitting: Family Medicine

## 2024-09-10 ENCOUNTER — Other Ambulatory Visit: Payer: Self-pay | Admitting: Family Medicine

## 2024-09-10 DIAGNOSIS — E119 Type 2 diabetes mellitus without complications: Secondary | ICD-10-CM

## 2024-09-18 ENCOUNTER — Ambulatory Visit: Admitting: Orthopaedic Surgery

## 2024-09-18 DIAGNOSIS — G8929 Other chronic pain: Secondary | ICD-10-CM | POA: Diagnosis not present

## 2024-09-18 DIAGNOSIS — M25511 Pain in right shoulder: Secondary | ICD-10-CM | POA: Diagnosis not present

## 2024-09-18 DIAGNOSIS — M25512 Pain in left shoulder: Secondary | ICD-10-CM | POA: Diagnosis not present

## 2024-09-18 NOTE — Progress Notes (Signed)
 Office Visit Note   Patient: [name]           Date of Birth: Feb 13, 1956           MRN: [id number] Visit Date: 09/18/2024              Requested by: Donah Laymon PARAS, MD [address] [place],  [place] [address] PCP: Elicia Hamlet, MD   Assessment & Plan: Visit Diagnoses:  1. Chronic pain of both shoulders     Plan: History of Present Illness [name] is a 68 year old female with bilateral shoulder osteoarthritis who presents with several months of bilateral shoulder pain and restricted range of motion.  For several months she has had substantial bilateral shoulder pain with marked limitation in range of motion, especially with arm elevation, which interferes with daily activities. She has not received prior intra-articular shoulder injections.  Shoulder radiographs were obtained in November 2025 to evaluate this pain.  She also has chronic knee pain with persistent difficulty ambulating and describes herself as hobbling, and has not pursued knee surgery since a recent relocation.  Physical Exam MUSCULOSKELETAL: Limited range of motion in shoulders with pain.    Results Radiology Bilateral shoulder X-ray (08/2024): Glenohumeral joint space narrowing with bone-on-bone articulation bilaterally (Independently interpreted)  Assessment and Plan Bilateral shoulder osteoarthritis Chronic, symptomatic bilateral shoulder osteoarthritis with advanced degenerative changes. Pain and limited range of motion consistent with progressive degenerative joint disease. - Referred to Dr. Burnetta for bilateral shoulder corticosteroid injections under ultrasound guidance. - Recommended initiation of physical therapy to maintain range of motion and strength.  Follow-Up Instructions: No follow-ups on file.   Orders:  Orders Placed This Encounter  Procedures   Ambulatory referral to Physical Therapy   AMB referral to sports medicine   No orders of the defined  types were placed in this encounter.     Procedures: No procedures performed   Clinical Data: No additional findings.   Subjective: Chief Complaint  Patient presents with   Left Shoulder - Pain   Right Shoulder - Pain    HPI  Review of Systems  Constitutional: Negative.   HENT: Negative.    Eyes: Negative.   Respiratory: Negative.    Cardiovascular: Negative.   Endocrine: Negative.   Musculoskeletal: Negative.   Neurological: Negative.   Hematological: Negative.   Psychiatric/Behavioral: Negative.    All other systems reviewed and are negative.    Objective: Vital Signs: There were no vitals taken for this visit.  Physical Exam Vitals and nursing note reviewed.  Constitutional:      Appearance: She is well-developed.  HENT:     Head: Atraumatic.     Nose: Nose normal.  Eyes:     Extraocular Movements: Extraocular movements intact.  Cardiovascular:     Pulses: Normal pulses.  Pulmonary:     Effort: Pulmonary effort is normal.  Abdominal:     Palpations: Abdomen is soft.  Musculoskeletal:     Cervical back: Neck supple.  Skin:    General: Skin is warm.     Capillary Refill: Capillary refill takes less than 2 seconds.  Neurological:     Mental Status: She is alert. Mental status is at baseline.  Psychiatric:        Behavior: Behavior normal.        Thought Content: Thought content normal.        Judgment: Judgment normal.     Ortho Exam  Specialty Comments:  No  specialty comments available.  Imaging: No results found.   PMFS History: Patient Active Problem List   Diagnosis Date Noted   Chronic pain of both shoulders 09/18/2024   OA (osteoarthritis) of shoulder 09/05/2024   Rash 07/17/2023   Primary osteoarthritis of right knee 07/06/2022   Primary osteoarthritis of left knee 07/06/2022   Onychomycosis 05/31/2022   Nodule of skin of left lower leg 03/17/2021   Lower extremity edema 04/18/2020   Encounter for health maintenance  examination 05/10/2019   Bilateral cataracts 10/13/2018   Allergic rhinitis 07/11/2017   Osteoarthritis of both knees 12/20/2016   Sciatica of right side 02/06/2014   Diabetes mellitus without complication (HCC) 03/08/2008   Lipoma 03/31/2007   Mixed hyperlipidemia due to type 2 diabetes mellitus (HCC) 12/01/2006   Morbid obesity (HCC) 12/01/2006   Primary hypertension 12/01/2006   GERD (gastroesophageal reflux disease) 12/01/2006   Past Medical History:  Diagnosis Date   Allergy not sure   Arthritis 2013   Cataract 2015   removed 2022   Diabetes mellitus without complication (HCC)    GERD (gastroesophageal reflux disease)    Hypertension     Family History  Problem Relation Age of Onset   Heart disease Mother    Hypertension Mother    Kidney disease Mother    Hypertension Father    Heart disease Father    Hypertension Sister    Hypertension Brother     Past Surgical History:  Procedure Laterality Date   DILATATION & CURETTAGE/HYSTEROSCOPY WITH MYOSURE N/A 08/26/2015   Procedure: DILATATION & CURETTAGE/HYSTEROSCOPY WITH MYOSURE;  Surgeon: Curlee VEAR Guan, MD;  Location: [facility];  Service: Gynecology;  Laterality: N/A;   EYE SURGERY  2022   caterects   TUBAL LIGATION     Social History   Occupational History   Not on file  Tobacco Use   Smoking status: Former    Current packs/day: 0.00    Average packs/day: 1 pack/day for 10.0 years (10.0 ttl pk-yrs)    Types: Cigarettes    Quit date: 06/04/1996    Years since quitting: 28.3    Passive exposure: Past   Smokeless tobacco: Never  Substance and Sexual Activity   Alcohol use: Yes    Alcohol/week: 1.0 - 2.0 standard drink of alcohol    Types: 1 - 2 Glasses of wine per week   Drug use: Never   Sexual activity: Not Currently    Birth control/protection: None

## 2024-09-28 ENCOUNTER — Encounter: Payer: Self-pay | Admitting: Sports Medicine

## 2024-09-28 ENCOUNTER — Other Ambulatory Visit: Payer: Self-pay

## 2024-09-28 ENCOUNTER — Ambulatory Visit: Admitting: Sports Medicine

## 2024-09-28 DIAGNOSIS — M25512 Pain in left shoulder: Secondary | ICD-10-CM

## 2024-09-28 DIAGNOSIS — G8929 Other chronic pain: Secondary | ICD-10-CM

## 2024-09-28 DIAGNOSIS — E119 Type 2 diabetes mellitus without complications: Secondary | ICD-10-CM | POA: Diagnosis not present

## 2024-09-28 DIAGNOSIS — M25511 Pain in right shoulder: Secondary | ICD-10-CM

## 2024-09-28 DIAGNOSIS — M19012 Primary osteoarthritis, left shoulder: Secondary | ICD-10-CM | POA: Diagnosis not present

## 2024-09-28 DIAGNOSIS — M19011 Primary osteoarthritis, right shoulder: Secondary | ICD-10-CM | POA: Diagnosis not present

## 2024-09-28 MED ORDER — METHYLPREDNISOLONE ACETATE 40 MG/ML IJ SUSP
40.0000 mg | INTRAMUSCULAR | Status: AC | PRN
  Administered 2024-09-28: 40 mg via INTRA_ARTICULAR

## 2024-09-28 MED ORDER — BUPIVACAINE HCL 0.25 % IJ SOLN
2.0000 mL | INTRAMUSCULAR | Status: AC | PRN
  Administered 2024-09-28: 2 mL via INTRA_ARTICULAR

## 2024-09-28 MED ORDER — LIDOCAINE HCL 1 % IJ SOLN
2.0000 mL | INTRAMUSCULAR | Status: AC | PRN
  Administered 2024-09-28: 2 mL

## 2024-09-28 NOTE — Progress Notes (Signed)
 "  [name] Toyah - 68 y.o. female MRN [id number]  Date of birth: 15-Dec-1955  Office Visit Note: Visit Date: 09/28/2024 PCP: Elicia Hamlet, MD Referred by: Jerri Kay HERO, MD  Subjective: Chief Complaint  Patient presents with   Right Shoulder - Pain   Left Shoulder - Pain   HPI: [name] is a pleasant 68 y.o. female who presents today for acute on chronic bilateral shoulder pain with advanced OA.  She has had several months of bilateral shoulder pain.  She initially saw Dr. Jerri for this, who recommended referral here for consideration of injection and other nonoperative treatment options.  She was referred to physical therapy for both of her shoulders and knees as well, has her first upcoming appointment on 10/10/23. In terms of pain control, she is using Tramadol  and Tylenol .  She is a type II diabetic, although recently well-controlled.  She is managed on metformin  XR 500mg  BID, Jardiance  10mg  every day, as well as Ozempic  1mg  IM weekly. She does monitor bG about once per day with fingerstick.  Lab Results  Component Value Date   HGBA1C 6.6 07/16/2024   Pertinent ROS were reviewed with the patient and found to be negative unless otherwise specified above in HPI.   Assessment & Plan: Visit Diagnoses:  1. Primary osteoarthritis of both shoulders   2. Chronic pain of both shoulders   3. Diabetes mellitus without complication (HCC)    Plan: Impression is acute on chronic bilateral shoulder pain with severe, bone-on-bone arthritic change of both shoulders.  This is limiting her daily activity and causing her pain, specifically at night.  Through shared decision making, we did proceed with ultrasound-guided right and left intra-articular injection, patient tolerated well.  Advised on postinjection protocol.  May use ice/heat and/or Tylenol  for postinjection pain.  She will continue her tramadol  50 mg 1 to 2 tablets every 12 hours as needed for her chronic pain.  She is a type  II diabetic, but is relatively well-controlled.  Discussed transient glucose rise, she does check her sugars in the interim.  Will continue metformin , Ozempic  and her Jardiance .  She will progress to formalized physical therapy and she will follow-up with Dr. Jerri for this and her knees.  I did discuss with her infrequent injections greater than 3 months apart that I could perform if this is helpful for her.  Follow-up: Return if symptoms worsen or fail to improve.   Meds & Orders: No orders of the defined types were placed in this encounter.   Orders Placed This Encounter  Procedures   Large Joint Inj   Large Joint Inj   US  Guided Needle Placement - No Linked Charges     Procedures: Large Joint Inj: R glenohumeral on 09/28/2024 11:31 AM Indications: pain Details: 22 G 3.5 in needle, ultrasound-guided posterior approach Medications: 2 mL lidocaine  1 %; 2 mL bupivacaine  0.25 %; 40 mg methylPREDNISolone  acetate 40 MG/ML Outcome: tolerated well, no immediate complications  US -guided glenohumeral joint injection, right shoulder After discussion on risks/benefits/indications, informed verbal consent was obtained. A timeout was then performed. The patient was positioned lying lateral recumbent on examination table. The patient's shoulder was prepped with betadine and multiple alcohol swabs and utilizing ultrasound guidance, the patient's glenohumeral joint was identified on ultrasound. Using ultrasound guidance a 22-gauge, 3.5 inch needle with a mixture of 2:2:1 cc's lidocaine :bupivicaine:depomedrol was directed from a lateral to medial direction via in-plane technique into the glenohumeral joint with visualization of appropriate spread of  injectate into the joint. Patient tolerated the procedure well without immediate complications.      Procedure, treatment alternatives, risks and benefits explained, specific risks discussed. Consent was given by the patient. Immediately prior to procedure a time  out was called to verify the correct patient, procedure, equipment, support staff and site/side marked as required. Patient was prepped and draped in the usual sterile fashion.    Large Joint Inj: L glenohumeral on 09/28/2024 11:32 AM Indications: pain Details: 22 G 3.5 in needle, ultrasound-guided posterior approach Medications: 2 mL lidocaine  1 %; 2 mL bupivacaine  0.25 %; 40 mg methylPREDNISolone  acetate 40 MG/ML Outcome: tolerated well, no immediate complications  US -guided glenohumeral joint injection, left shoulder After discussion on risks/benefits/indications, informed verbal consent was obtained. A timeout was then performed. The patient was positioned lying lateral recumbent on examination table. The patient's shoulder was prepped with betadine and multiple alcohol swabs and utilizing ultrasound guidance, the patient's glenohumeral joint was identified on ultrasound. Using ultrasound guidance a 22-gauge, 3.5 inch needle with a mixture of 2:2:1 cc's lidocaine :bupivicaine:depomedrol was directed from a lateral to medial direction via in-plane technique into the glenohumeral joint with visualization of appropriate spread of injectate into the joint. Patient tolerated the procedure well without immediate complications.      Procedure, treatment alternatives, risks and benefits explained, specific risks discussed. Consent was given by the patient. Immediately prior to procedure a time out was called to verify the correct patient, procedure, equipment, support staff and site/side marked as required. Patient was prepped and draped in the usual sterile fashion.          Clinical History: No specialty comments available.  She reports that she quit smoking about 28 years ago. Her smoking use included cigarettes. She has a 10 pack-year smoking history. She has been exposed to tobacco smoke. She has never used smokeless tobacco.  Recent Labs    10/13/23 1130 01/12/24 1012 07/16/24 1344   HGBA1C 6.4 6.7 6.6    Objective:    Physical Exam  Gen: Well-appearing, in no acute distress; non-toxic CV: Well-perfused. Warm.  Resp: Breathing unlabored on room air; no wheezing. Psych: Fluid speech in conversation; appropriate affect; normal thought process  Ortho Exam - Bilateral shoulders: There is limited active and passive range of motion in all directions with associated crepitus and pain.  There is a small effusion about the right shoulder joint today, trace on the left.  Imaging:  *Independent review and interpretation of both right and left shoulder x-rays from 08/14/2024 was performed by myself today.  3 views were reviewed which show essentially bone-on-bone arthritic change within the glenohumeral joint with notable subchondral sclerosis.  No acute fracture noted.  DG Shoulder Right CLINICAL DATA:  Chronic shoulder pain, limited range of motion  EXAM: DG SHOULDER 2+V*R*  COMPARISON:  None Available.  FINDINGS: Internal rotation, external rotation, and transscapular views of the right shoulder are obtained. There is severe acromioclavicular and glenohumeral joint osteoarthritis, with complete loss of joint space of the glenohumeral joint. No acute fracture, subluxation, or dislocation. Soft tissues are unremarkable. The right chest is clear.  IMPRESSION: 1. Severe right shoulder osteoarthritis, most significant within the glenohumeral joint.  Electronically Signed   By: Ozell Daring M.D.   On: 08/14/2024 19:36 DG Shoulder Left CLINICAL DATA:  Chronic shoulder pain, limited range of motion  EXAM: DG SHOULDER 2+V*L*  COMPARISON:  None Available.  FINDINGS: Internal rotation, external rotation, and transscapular views of the left shoulder are obtained. There  is severe glenohumeral and acromioclavicular joint osteoarthritis. No acute fracture, subluxation, or dislocation. Soft tissues are unremarkable. Left chest is clear.  IMPRESSION: 1. Severe  osteoarthritis.  No acute fracture.  Electronically Signed   By: Ozell Daring M.D.   On: 08/14/2024 19:35    Past Medical/Family/Surgical/Social History: Medications & Allergies reviewed per EMR, new medications updated. Patient Active Problem List   Diagnosis Date Noted   Chronic pain of both shoulders 09/18/2024   OA (osteoarthritis) of shoulder 09/05/2024   Rash 07/17/2023   Primary osteoarthritis of right knee 07/06/2022   Primary osteoarthritis of left knee 07/06/2022   Onychomycosis 05/31/2022   Nodule of skin of left lower leg 03/17/2021   Lower extremity edema 04/18/2020   Encounter for health maintenance examination 05/10/2019   Bilateral cataracts 10/13/2018   Allergic rhinitis 07/11/2017   Osteoarthritis of both knees 12/20/2016   Sciatica of right side 02/06/2014   Diabetes mellitus without complication (HCC) 03/08/2008   Lipoma 03/31/2007   Mixed hyperlipidemia due to type 2 diabetes mellitus (HCC) 12/01/2006   Morbid obesity (HCC) 12/01/2006   Primary hypertension 12/01/2006   GERD (gastroesophageal reflux disease) 12/01/2006   Past Medical History:  Diagnosis Date   Allergy not sure   Arthritis 2013   Cataract 2015   removed 2022   Diabetes mellitus without complication (HCC)    GERD (gastroesophageal reflux disease)    Hypertension    Family History  Problem Relation Age of Onset   Heart disease Mother    Hypertension Mother    Kidney disease Mother    Hypertension Father    Heart disease Father    Hypertension Sister    Hypertension Brother    Past Surgical History:  Procedure Laterality Date   DILATATION & CURETTAGE/HYSTEROSCOPY WITH MYOSURE N/A 08/26/2015   Procedure: DILATATION & CURETTAGE/HYSTEROSCOPY WITH MYOSURE;  Surgeon: Curlee VEAR Guan, MD;  Location: [facility];  Service: Gynecology;  Laterality: N/A;   EYE SURGERY  2022   caterects   TUBAL LIGATION     Social History   Occupational History   Not on file  Tobacco Use   Smoking  status: Former    Current packs/day: 0.00    Average packs/day: 1 pack/day for 10.0 years (10.0 ttl pk-yrs)    Types: Cigarettes    Quit date: 06/04/1996    Years since quitting: 28.3    Passive exposure: Past   Smokeless tobacco: Never  Substance and Sexual Activity   Alcohol use: Yes    Alcohol/week: 1.0 - 2.0 standard drink of alcohol    Types: 1 - 2 Glasses of wine per week   Drug use: Never   Sexual activity: Not Currently    Birth control/protection: None   "

## 2024-10-08 NOTE — Therapy (Signed)
 " OUTPATIENT PHYSICAL THERAPY UPPER EXTREMITY EVALUATION   Patient Name: [name] MRN: [id number] DOB:[birth year], 69 y.o., female Today's Date: 10/09/2024  END OF SESSION:  PT End of Session - 10/09/24 1600     Visit Number 1    Number of Visits 21    Date for Recertification  12/18/24    PT Start Time 1500    PT Stop Time 1540    PT Time Calculation (min) 40 min    Activity Tolerance Patient tolerated treatment well    Behavior During Therapy [facility] for tasks assessed/performed          Past Medical History:  Diagnosis Date   Allergy not sure   Arthritis 2013   Cataract 2015   removed 2022   Diabetes mellitus without complication (HCC)    GERD (gastroesophageal reflux disease)    Hypertension    Past Surgical History:  Procedure Laterality Date   DILATATION & CURETTAGE/HYSTEROSCOPY WITH MYOSURE N/A 08/26/2015   Procedure: DILATATION & CURETTAGE/HYSTEROSCOPY WITH MYOSURE;  Surgeon: Curlee VEAR Guan, MD;  Location: [facility];  Service: Gynecology;  Laterality: N/A;   EYE SURGERY  2022   caterects   TUBAL LIGATION     Patient Active Problem List   Diagnosis Date Noted   Chronic pain of both shoulders 09/18/2024   OA (osteoarthritis) of shoulder 09/05/2024   Rash 07/17/2023   Primary osteoarthritis of right knee 07/06/2022   Primary osteoarthritis of left knee 07/06/2022   Onychomycosis 05/31/2022   Nodule of skin of left lower leg 03/17/2021   Lower extremity edema 04/18/2020   Encounter for health maintenance examination 05/10/2019   Bilateral cataracts 10/13/2018   Allergic rhinitis 07/11/2017   Osteoarthritis of both knees 12/20/2016   Sciatica of right side 02/06/2014   Diabetes mellitus without complication (HCC) 03/08/2008   Lipoma 03/31/2007   Mixed hyperlipidemia due to type 2 diabetes mellitus (HCC) 12/01/2006   Morbid obesity (HCC) 12/01/2006   Primary hypertension 12/01/2006   GERD (gastroesophageal reflux disease) 12/01/2006    PCP: Elicia Hamlet, MD   REFERRING PROVIDER: Jerri Kay HERO, MD   REFERRING DIAG: M25.511,G89.29,M25.512 (ICD-10-CM) - Chronic pain of both shoulders   THERAPY DIAG:   Rationale for Evaluation and Treatment: Rehabilitation  ONSET DATE: chronic  SUBJECTIVE:                                                                                                                                                                                      SUBJECTIVE STATEMENT: Pt reports bilateral shoulder pain that started in summer 2025.  Started in the right shoulder and progressed.  Then the left shoulder started.  She had a cortisone shot in each at the end of December and reports some decrease in pain.  Now pain slowly coming back.   Hand dominance: Right  PERTINENT HISTORY: No previous shoulder issues  PAIN:  Are you having pain? meds  PRECAUTIONS: None  RED FLAGS: None   WEIGHT BEARING RESTRICTIONS: No  FALLS:  Has patient fallen in last 6 months? Yes. Number of falls 1  LIVING ENVIRONMENT: Lives with: lives with their family Lives in: House/apartment Stairs: Yes: Internal: 12 steps; on left going up Has following equipment at home: Single point cane  OCCUPATION: retired  PLOF: Independent with basic ADLs  PATIENT GOALS: increase motion  NEXT MD VISIT: --  OBJECTIVE:  Note: Objective measures were completed at Evaluation unless otherwise noted.  DIAGNOSTIC FINDINGS:  08/09/24  IMPRESSION: 1. Severe right shoulder osteoarthritis, most significant within the glenohumeral joint.  08/09/24 L IMPRESSION: 1. Severe osteoarthritis.  No acute fracture.    PATIENT SURVEYS :  PSFS: THE PATIENT SPECIFIC FUNCTIONAL SCALE  Place score of 0-10 (0 = unable to perform activity and 10 = able to perform activity at the same level as before injury or problem)  Activity Date: 10/09/24    Dressing 2    2.Showering and reaching across body 2    3.reaching up into high cabinet 0    4.      Total  Score 1.33      Total Score = Sum of activity scores/number of activities  Minimally Detectable Change: 3 points (for single activity); 2 points (for average score)  [employer] [facility] (nd). The Patient Specific Functional Scale . Retrieved from Skateo[employer].com.pt   COGNITION: Overall cognitive status: Within functional limits for tasks assessed     SENSATION: WFL  POSTURE: Rounded shoulders  UPPER EXTREMITY ROM:   Active/Passive ROM Right Eval 1/6 Left Eval 1/6  Shoulder flexion 95 120  Shoulder extension 25 48  Shoulder abduction 45 88  Shoulder adduction    Shoulder internal rotation crest sacrum  Shoulder external rotation 5 25  Elbow flexion    Elbow extension    Wrist flexion    Wrist extension    Wrist ulnar deviation    Wrist radial deviation    Wrist pronation    Wrist supination    (Blank rows = not tested)  UPPER EXTREMITY MMT:  MMT Right eval Left eval  Shoulder flexion 3- 3+  Shoulder extension    Shoulder abduction 3- 3-  Shoulder adduction    Shoulder internal rotation 4- 4  Shoulder external rotation 3- 3+  Middle trapezius    Lower trapezius    Elbow flexion    Elbow extension    Wrist flexion    Wrist extension    Wrist ulnar deviation    Wrist radial deviation    Wrist pronation    Wrist supination    Grip strength (lbs)    (Blank rows = not tested)  SHOULDER SPECIAL TESTS: No special tests competed.  Pain with resisted ER/IR on the R but not L.  JOINT MOBILITY TESTING:  Decreased t/o GH joint bilaterally  PALPATION:  2+t/o  TREATMENT DATE: 10/09/24 Initial evaluation of bilateral shoulders completed followed by instruction and trial set of HEP.     PATIENT EDUCATION: Education details: HEP Person educated: Patient Education method:  Programmer, Multimedia, Facilities Manager, Actor cues, Verbal cues, and Handouts Education comprehension: verbalized understanding, returned demonstration, and verbal cues required  HOME EXERCISE PROGRAM: Access Code: [email or web address] URL: [email or web address] Date: 10/09/2024 Prepared by: Burnard Meth  Exercises - Seated Scapular Retraction  - 2 x daily - 7 x weekly - 2 sets - 10 reps - Seated Shoulder Shrugs  - 2 x daily - 7 x weekly - 2 sets - 10 reps  ASSESSMENT:  CLINICAL IMPRESSION: Patient is a 69 y.o. female who was seen today for physical therapy evaluation and treatment for bilateral shoulder pain. She presents with decreased- ROM, strength, jt mobility and pain. Patient would benefit from skilled PT services to address these deficits and return to previous LOA.   OBJECTIVE IMPAIRMENTS: decreased mobility, difficulty walking, decreased ROM, decreased strength, impaired flexibility, impaired UE functional use, and pain.   ACTIVITY LIMITATIONS: carrying, lifting, stairs, bathing, dressing, and reach over head  PARTICIPATION LIMITATIONS: meal prep, cleaning, driving, and community activity  PERSONAL FACTORS: 1-2 comorbidities: DM, severe OA are also affecting patient's functional outcome.   REHAB POTENTIAL: Fair see above  CLINICAL DECISION MAKING: Stable/uncomplicated  EVALUATION COMPLEXITY: Moderate  GOALS: Goals reviewed with patient? Yes  SHORT TERM GOALS: Target date: 10/31/2024    Pt to be independent with HEP.   Baseline: Goal status: INITIAL  2.  Decrease pain by 1 level. Baseline:  Goal status: INITIAL  LONG TERM GOALS: Target date: 12/18/2024  10 weeks    Pt to be independent with self progressive HEP at discharge.   Baseline:  Goal status: INITIAL  2.  Decrease max pain to 3/10 or less with all activities. Baseline:  Goal status: INITIAL  3.  Increase AROM by 25%. Baseline:  Goal status: INITIAL  4.  Increase strength in UE by 1/2 grade to 1  full grade where decreased. Baseline:  Goal status: INITIAL PLAN: PT FREQUENCY: 1-2x/week  PT DURATION: 10 weeks  PLANNED INTERVENTIONS: 02835- PT Re-evaluation, 97750- Physical Performance Testing, 97110-Therapeutic exercises, 97530- Therapeutic activity, [id number]- Neuromuscular re-education, 97535- Self Care, 02859- Manual therapy, [phone]- Aquatic Therapy, [phone]- Electrical stimulation (unattended), [phone] (1-2 muscles), 20561 (3+ muscles)- Dry Needling, Patient/Family education, Joint mobilization, Cryotherapy, and Moist heat  PLAN FOR NEXT SESSION: Pt reported toward the end of the evaluation that she does not drive and now lives in [place]. This will pose a problem for consistent treatment and to achieve advancement in PT.  Discussed with patient seeking PT facility closer to her home.    Burnard CHRISTELLA Meth, PT 10/09/2024, 4:01 PM  "

## 2024-10-09 ENCOUNTER — Encounter: Payer: Self-pay | Admitting: Orthopaedic Surgery

## 2024-10-09 ENCOUNTER — Ambulatory Visit

## 2024-10-09 ENCOUNTER — Other Ambulatory Visit: Payer: Self-pay

## 2024-10-09 DIAGNOSIS — M25612 Stiffness of left shoulder, not elsewhere classified: Secondary | ICD-10-CM | POA: Diagnosis not present

## 2024-10-09 DIAGNOSIS — M25611 Stiffness of right shoulder, not elsewhere classified: Secondary | ICD-10-CM

## 2024-10-09 DIAGNOSIS — G8929 Other chronic pain: Secondary | ICD-10-CM

## 2024-10-09 DIAGNOSIS — M25511 Pain in right shoulder: Secondary | ICD-10-CM

## 2024-10-09 DIAGNOSIS — M25512 Pain in left shoulder: Secondary | ICD-10-CM | POA: Diagnosis not present

## 2024-10-09 NOTE — Telephone Encounter (Signed)
 Sure approve.  Thanks.

## 2024-10-10 ENCOUNTER — Other Ambulatory Visit: Payer: Self-pay

## 2024-10-10 DIAGNOSIS — G8929 Other chronic pain: Secondary | ICD-10-CM

## 2024-10-17 NOTE — Therapy (Addendum)
 " OUTPATIENT PHYSICAL THERAPY UPPER EXTREMITY TREATMENT/DC   Patient Name: [name] MRN: [id number] DOB:[birth year], 69 y.o., female Today's Date: 10/18/2024  END OF SESSION:  PT End of Session - 10/18/24 1417     Visit Number 2    Number of Visits 21    Date for Recertification  12/18/24    PT Start Time 1345    PT Stop Time 1415    PT Time Calculation (min) 30 min    Activity Tolerance Patient tolerated treatment well    Behavior During Therapy [facility] for tasks assessed/performed           Past Medical History:  Diagnosis Date   Allergy not sure   Arthritis 2013   Cataract 2015   removed 2022   Diabetes mellitus without complication (HCC)    GERD (gastroesophageal reflux disease)    Hypertension    Past Surgical History:  Procedure Laterality Date   DILATATION & CURETTAGE/HYSTEROSCOPY WITH MYOSURE N/A 08/26/2015   Procedure: DILATATION & CURETTAGE/HYSTEROSCOPY WITH MYOSURE;  Surgeon: Curlee VEAR Guan, MD;  Location: [facility];  Service: Gynecology;  Laterality: N/A;   EYE SURGERY  2022   caterects   TUBAL LIGATION     Patient Active Problem List   Diagnosis Date Noted   Chronic pain of both shoulders 09/18/2024   OA (osteoarthritis) of shoulder 09/05/2024   Rash 07/17/2023   Primary osteoarthritis of right knee 07/06/2022   Primary osteoarthritis of left knee 07/06/2022   Onychomycosis 05/31/2022   Nodule of skin of left lower leg 03/17/2021   Lower extremity edema 04/18/2020   Encounter for health maintenance examination 05/10/2019   Bilateral cataracts 10/13/2018   Allergic rhinitis 07/11/2017   Osteoarthritis of both knees 12/20/2016   Sciatica of right side 02/06/2014   Diabetes mellitus without complication (HCC) 03/08/2008   Lipoma 03/31/2007   Mixed hyperlipidemia due to type 2 diabetes mellitus (HCC) 12/01/2006   Morbid obesity (HCC) 12/01/2006   Primary hypertension 12/01/2006   GERD (gastroesophageal reflux disease) 12/01/2006    PCP:  Elicia Hamlet, MD   REFERRING PROVIDER: Jerri Kay HERO, MD   REFERRING DIAG: M25.511,G89.29,M25.512 (ICD-10-CM) - Chronic pain of both shoulders   THERAPY DIAG:   Rationale for Evaluation and Treatment: Rehabilitation  ONSET DATE: chronic  SUBJECTIVE:                                                                                                                                                                                      SUBJECTIVE STATEMENT: Pt reports compliance with HEP.  States today the pain is affecting motion in her shoulders. Hand dominance: Right  PERTINENT HISTORY: No  previous shoulder issues  PAIN:  Are you having pain? 6/10  PRECAUTIONS: None  RED FLAGS: None   WEIGHT BEARING RESTRICTIONS: No  FALLS:  Has patient fallen in last 6 months? Yes. Number of falls 1  LIVING ENVIRONMENT: Lives with: lives with their family Lives in: House/apartment Stairs: Yes: Internal: 12 steps; on left going up Has following equipment at home: Single point cane  OCCUPATION: retired  PLOF: Independent with basic ADLs  PATIENT GOALS: increase motion  NEXT MD VISIT: --  OBJECTIVE:  Note: Objective measures were completed at Evaluation unless otherwise noted.  DIAGNOSTIC FINDINGS:  08/09/24  IMPRESSION: 1. Severe right shoulder osteoarthritis, most significant within the glenohumeral joint.  08/09/24 L IMPRESSION: 1. Severe osteoarthritis.  No acute fracture.    PATIENT SURVEYS :  PSFS: THE PATIENT SPECIFIC FUNCTIONAL SCALE  Place score of 0-10 (0 = unable to perform activity and 10 = able to perform activity at the same level as before injury or problem)  Activity Date: 10/09/24    Dressing 2    2.Showering and reaching across body 2    3.reaching up into high cabinet 0    4.      Total Score 1.33      Total Score = Sum of activity scores/number of activities  Minimally Detectable Change: 3 points (for single activity); 2 points (for average  score)  [employer] [facility] (nd). The Patient Specific Functional Scale . Retrieved from [employer].com.pt   COGNITION: Overall cognitive status: Within functional limits for tasks assessed     SENSATION: WFL  POSTURE: Rounded shoulders  UPPER EXTREMITY ROM:   Active/Passive ROM Right Eval 1/6 Left Eval 1/6  Shoulder flexion 95 120  Shoulder extension 25 48  Shoulder abduction 45 88  Shoulder adduction    Shoulder internal rotation crest sacrum  Shoulder external rotation 5 25  Elbow flexion    Elbow extension    Wrist flexion    Wrist extension    Wrist ulnar deviation    Wrist radial deviation    Wrist pronation    Wrist supination    (Blank rows = not tested)  UPPER EXTREMITY MMT:  MMT Right eval Left eval  Shoulder flexion 3- 3+  Shoulder extension    Shoulder abduction 3- 3-  Shoulder adduction    Shoulder internal rotation 4- 4  Shoulder external rotation 3- 3+  Middle trapezius    Lower trapezius    Elbow flexion    Elbow extension    Wrist flexion    Wrist extension    Wrist ulnar deviation    Wrist radial deviation    Wrist pronation    Wrist supination    Grip strength (lbs)    (Blank rows = not tested)  SHOULDER SPECIAL TESTS: No special tests competed.  Pain with resisted ER/IR on the R but not L.  JOINT MOBILITY TESTING:  Decreased t/o GH joint bilaterally  PALPATION:  2+t/o  TREATMENT DATE:  10/18/24 Pulleys 3 min Seated scaption 2x10 1# Seated rows with TB 3x10 Red Seated shrugs 2x10 Seated bicep curls 2x10 1#    10/09/24 Initial evaluation of bilateral shoulders completed followed by instruction and trial set of HEP.     PATIENT EDUCATION: Education details: HEP Person educated: Patient Education method: Programmer, Multimedia, Facilities Manager, Actor  cues, Verbal cues, and Handouts Education comprehension: verbalized understanding, returned demonstration, and verbal cues required  HOME EXERCISE PROGRAM: Access Code: [email or web address] URL: [email or web address] Date: 10/09/2024 Prepared by: Burnard Meth  Exercises - Seated Scapular Retraction  - 2 x daily - 7 x weekly - 2 sets - 10 reps - Seated Shoulder Shrugs  - 2 x daily - 7 x weekly - 2 sets - 10 reps  ASSESSMENT:  CLINICAL IMPRESSION: Patient needed Vc for all there ex.  Demonstrated understanding.  OBJECTIVE IMPAIRMENTS: decreased mobility, difficulty walking, decreased ROM, decreased strength, impaired flexibility, impaired UE functional use, and pain.   ACTIVITY LIMITATIONS: carrying, lifting, stairs, bathing, dressing, and reach over head  PARTICIPATION LIMITATIONS: meal prep, cleaning, driving, and community activity  PERSONAL FACTORS: 1-2 comorbidities: DM, severe OA are also affecting patient's functional outcome.   REHAB POTENTIAL: Fair see above  CLINICAL DECISION MAKING: Stable/uncomplicated  EVALUATION COMPLEXITY: Moderate  GOALS: Goals reviewed with patient? Yes  SHORT TERM GOALS: Target date: 10/31/2024    Pt to be independent with HEP.   Baseline: Goal status: INITIAL  2.  Decrease pain by 1 level. Baseline:  Goal status: INITIAL  LONG TERM GOALS: Target date: 12/18/2024  10 weeks    Pt to be independent with self progressive HEP at discharge.   Baseline:  Goal status: INITIAL  2.  Decrease max pain to 3/10 or less with all activities. Baseline:  Goal status: INITIAL  3.  Increase AROM by 25%. Baseline:  Goal status: INITIAL  4.  Increase strength in UE by 1/2 grade to 1 full grade where decreased. Baseline:  Goal status: INITIAL PLAN: PT FREQUENCY: 1-2x/week  PT DURATION: 10 weeks  PLANNED INTERVENTIONS: 02835- PT Re-evaluation, 97750- Physical Performance Testing, 97110-Therapeutic exercises, 97530- Therapeutic activity,  97112- Neuromuscular re-education, 97535- Self Care, 02859- Manual therapy, [phone]- Aquatic Therapy, [id number]- Electrical stimulation (unattended), [phone] (1-2 muscles), 20561 (3+ muscles)- Dry Needling, Patient/Family education, Joint mobilization, Cryotherapy, and Moist heat  PLAN FOR NEXT SESSION: Pt reported that she is looking into PT offices closer to her home in [place], but will continue with this clinic until it is arranged. Burnard CHRISTELLA Meth, PT 10/18/2024, 2:18 PM   PHYSICAL THERAPY DISCHARGE SUMMARY  Visits from Start of Care: 2  Current functional level related to goals / functional outcomes: See above   Remaining deficits: See above   Education / Equipment: HEP   Patient agrees to discharge. Patient goals were not met. Patient is being discharged due to moved to closer PT facility.[name]  "

## 2024-10-18 ENCOUNTER — Ambulatory Visit

## 2024-10-18 DIAGNOSIS — G8929 Other chronic pain: Secondary | ICD-10-CM | POA: Diagnosis not present

## 2024-10-18 DIAGNOSIS — M25611 Stiffness of right shoulder, not elsewhere classified: Secondary | ICD-10-CM

## 2024-10-18 DIAGNOSIS — M25511 Pain in right shoulder: Secondary | ICD-10-CM

## 2024-10-18 DIAGNOSIS — M25512 Pain in left shoulder: Secondary | ICD-10-CM | POA: Diagnosis not present

## 2024-10-18 DIAGNOSIS — M25612 Stiffness of left shoulder, not elsewhere classified: Secondary | ICD-10-CM | POA: Diagnosis not present

## 2024-10-22 ENCOUNTER — Encounter: Payer: Self-pay | Admitting: Orthopaedic Surgery

## 2024-10-23 ENCOUNTER — Encounter: Admitting: Physical Therapy

## 2024-10-30 ENCOUNTER — Encounter: Admitting: Physical Therapy

## 2024-10-31 NOTE — Therapy (Incomplete)
 " OUTPATIENT PHYSICAL THERAPY UPPER EXTREMITY TREATMENT   Patient Name: [name] MRN: [id number] DOB:November 07, 1955, 69 y.o., female Today's Date: 10/31/2024  END OF SESSION:     Past Medical History:  Diagnosis Date   Allergy not sure   Arthritis 2013   Cataract 2015   removed 2022   Diabetes mellitus without complication (HCC)    GERD (gastroesophageal reflux disease)    Hypertension    Past Surgical History:  Procedure Laterality Date   DILATATION & CURETTAGE/HYSTEROSCOPY WITH MYOSURE N/A 08/26/2015   Procedure: DILATATION & CURETTAGE/HYSTEROSCOPY WITH MYOSURE;  Surgeon: Curlee VEAR Guan, MD;  Location: [facility];  Service: Gynecology;  Laterality: N/A;   EYE SURGERY  2022   caterects   TUBAL LIGATION     Patient Active Problem List   Diagnosis Date Noted   Chronic pain of both shoulders 09/18/2024   OA (osteoarthritis) of shoulder 09/05/2024   Rash 07/17/2023   Primary osteoarthritis of right knee 07/06/2022   Primary osteoarthritis of left knee 07/06/2022   Onychomycosis 05/31/2022   Nodule of skin of left lower leg 03/17/2021   Lower extremity edema 04/18/2020   Encounter for health maintenance examination 05/10/2019   Bilateral cataracts 10/13/2018   Allergic rhinitis 07/11/2017   Osteoarthritis of both knees 12/20/2016   Sciatica of right side 02/06/2014   Diabetes mellitus without complication (HCC) 03/08/2008   Lipoma 03/31/2007   Mixed hyperlipidemia due to type 2 diabetes mellitus (HCC) 12/01/2006   Morbid obesity (HCC) 12/01/2006   Primary hypertension 12/01/2006   GERD (gastroesophageal reflux disease) 12/01/2006    PCP: Elicia Hamlet, MD   REFERRING PROVIDER: Elicia Hamlet, MD   REFERRING DIAG: M25.511,G89.29,M25.512 (ICD-10-CM) - Chronic pain of both shoulders   THERAPY DIAG:   Rationale for Evaluation and Treatment: Rehabilitation  ONSET DATE: chronic  SUBJECTIVE:                                                                                                                                                                                       SUBJECTIVE STATEMENT: ***Pt reports compliance with HEP.  States today the pain is affecting motion in her shoulders. Hand dominance: Right  PERTINENT HISTORY: No previous shoulder issues  PAIN:  ***Are you having pain? 6/10  PRECAUTIONS: None  RED FLAGS: None   WEIGHT BEARING RESTRICTIONS: No  FALLS:  Has patient fallen in last 6 months? Yes. Number of falls 1  LIVING ENVIRONMENT: Lives with: lives with their family Lives in: House/apartment Stairs: Yes: Internal: 12 steps; on left going up Has following equipment at home: Single point cane  OCCUPATION: retired  PLOF: Independent with basic ADLs  PATIENT  GOALS: increase motion  NEXT MD VISIT: --  OBJECTIVE:  Note: Objective measures were completed at Evaluation unless otherwise noted.  DIAGNOSTIC FINDINGS:  08/09/24  IMPRESSION: 1. Severe right shoulder osteoarthritis, most significant within the glenohumeral joint.  08/09/24 L IMPRESSION: 1. Severe osteoarthritis.  No acute fracture.    PATIENT SURVEYS :  PSFS: THE PATIENT SPECIFIC FUNCTIONAL SCALE  Place score of 0-10 (0 = unable to perform activity and 10 = able to perform activity at the same level as before injury or problem)  Activity Date: 10/09/24    Dressing 2    2.Showering and reaching across body 2    3.reaching up into high cabinet 0    4.      Total Score 1.33      Total Score = Sum of activity scores/number of activities  Minimally Detectable Change: 3 points (for single activity); 2 points (for average score)  [facility] (nd). The Patient Specific Functional Scale . Retrieved from [email or web address].pt   COGNITION: Overall cognitive status: Within functional limits for tasks assessed     SENSATION: WFL  POSTURE: Rounded shoulders  UPPER EXTREMITY ROM:    Active/Passive ROM Right Eval 1/6 Left Eval 1/6  Shoulder flexion 95 120  Shoulder extension 25 48  Shoulder abduction 45 88  Shoulder adduction    Shoulder internal rotation crest sacrum  Shoulder external rotation 5 25  Elbow flexion    Elbow extension    Wrist flexion    Wrist extension    Wrist ulnar deviation    Wrist radial deviation    Wrist pronation    Wrist supination    (Blank rows = not tested)  UPPER EXTREMITY MMT:  MMT Right eval Left eval  Shoulder flexion 3- 3+  Shoulder extension    Shoulder abduction 3- 3-  Shoulder adduction    Shoulder internal rotation 4- 4  Shoulder external rotation 3- 3+  Middle trapezius    Lower trapezius    Elbow flexion    Elbow extension    Wrist flexion    Wrist extension    Wrist ulnar deviation    Wrist radial deviation    Wrist pronation    Wrist supination    Grip strength (lbs)    (Blank rows = not tested)  SHOULDER SPECIAL TESTS: No special tests competed.  Pain with resisted ER/IR on the R but not L.  JOINT MOBILITY TESTING:  Decreased t/o GH joint bilaterally  PALPATION:  2+t/o                                                                                                                             TREATMENT DATE:  11/01/24***    10/18/24 Pulleys 3 min Seated scaption 2x10 1# Seated rows with TB 3x10 Red Seated shrugs 2x10 Seated bicep curls 2x10 1#    10/09/24 Initial evaluation of bilateral shoulders completed followed by instruction and trial set of HEP.  PATIENT EDUCATION: Education details: HEP Person educated: Patient Education method: Programmer, Multimedia, Facilities Manager, Actor cues, Verbal cues, and Handouts Education comprehension: verbalized understanding, returned demonstration, and verbal cues required  HOME EXERCISE PROGRAM: Access Code: [email or web address] URL: [email or web address] Date: 10/09/2024 Prepared by: Burnard Meth  Exercises - Seated Scapular Retraction  - 2  x daily - 7 x weekly - 2 sets - 10 reps - Seated Shoulder Shrugs  - 2 x daily - 7 x weekly - 2 sets - 10 reps  ASSESSMENT:  CLINICAL IMPRESSION: ***Patient needed Vc for all there ex.  Demonstrated understanding.  OBJECTIVE IMPAIRMENTS: decreased mobility, difficulty walking, decreased ROM, decreased strength, impaired flexibility, impaired UE functional use, and pain.   ACTIVITY LIMITATIONS: carrying, lifting, stairs, bathing, dressing, and reach over head  PARTICIPATION LIMITATIONS: meal prep, cleaning, driving, and community activity  PERSONAL FACTORS: 1-2 comorbidities: DM, severe OA are also affecting patient's functional outcome.   REHAB POTENTIAL: Fair see above  CLINICAL DECISION MAKING: Stable/uncomplicated  EVALUATION COMPLEXITY: Moderate  GOALS: Goals reviewed with patient? Yes  SHORT TERM GOALS: Target date: 10/31/2024***    Pt to be independent with HEP.   Baseline: Goal status: INITIAL  2.  Decrease pain by 1 level. Baseline:  Goal status: INITIAL  LONG TERM GOALS: Target date: 12/18/2024  10 weeks    Pt to be independent with self progressive HEP at discharge.   Baseline:  Goal status: INITIAL  2.  Decrease max pain to 3/10 or less with all activities. Baseline:  Goal status: INITIAL  3.  Increase AROM by 25%. Baseline:  Goal status: INITIAL  4.  Increase strength in UE by 1/2 grade to 1 full grade where decreased. Baseline:  Goal status: INITIAL PLAN: PT FREQUENCY: 1-2x/week  PT DURATION: 10 weeks  PLANNED INTERVENTIONS: 02835- PT Re-evaluation, 97750- Physical Performance Testing, 97110-Therapeutic exercises, 97530- Therapeutic activity, 97112- Neuromuscular re-education, 97535- Self Care, 02859- Manual therapy, [phone]- Aquatic Therapy, [id number]- Electrical stimulation (unattended), [phone] (1-2 muscles), [phone] (3+ muscles)- Dry Needling, Patient/Family education, Joint mobilization, Cryotherapy, and Moist heat  PLAN FOR NEXT SESSION: ***Pt  reported that she is looking into PT offices closer to her home in [place], but will continue with this clinic until it is arranged.   Burnard CHRISTELLA Meth, PT 10/31/2024, 8:00 AM  "

## 2024-11-01 ENCOUNTER — Telehealth: Payer: Self-pay

## 2024-11-01 ENCOUNTER — Encounter

## 2024-11-01 NOTE — Telephone Encounter (Signed)
 Called and spoke with patient about missed appt today.  She stated she is sick and  meant to call.  Confirmed next appointment.

## 2024-11-06 ENCOUNTER — Encounter

## 2024-11-08 ENCOUNTER — Telehealth: Payer: Self-pay | Admitting: Orthopaedic Surgery

## 2024-11-08 ENCOUNTER — Encounter

## 2024-11-08 NOTE — Telephone Encounter (Signed)
 Patient called. She would like Tramadol  called in for her. [facility] in [place] on [address]

## 2024-11-09 ENCOUNTER — Other Ambulatory Visit: Payer: Self-pay | Admitting: Physician Assistant

## 2024-11-09 MED ORDER — TRAMADOL HCL 50 MG PO TABS: 50.0000 mg | ORAL_TABLET | Freq: Every day | ORAL | 0 refills | Status: AC | PRN

## 2024-11-09 NOTE — Telephone Encounter (Signed)
 sent

## 2025-07-01 ENCOUNTER — Encounter
